# Patient Record
Sex: Female | Born: 1950 | Race: White | Hispanic: No | Marital: Married | Smoking: Current every day smoker
Health system: Southern US, Community
[De-identification: ages and names within clinical notes are randomized; demographics above are authoritative.]

## PROBLEM LIST (undated history)

## (undated) DIAGNOSIS — R51 Headache: Secondary | ICD-10-CM

## (undated) DIAGNOSIS — Z973 Presence of spectacles and contact lenses: Secondary | ICD-10-CM

## (undated) DIAGNOSIS — F419 Anxiety disorder, unspecified: Secondary | ICD-10-CM

## (undated) DIAGNOSIS — Z889 Allergy status to unspecified drugs, medicaments and biological substances status: Secondary | ICD-10-CM

## (undated) DIAGNOSIS — R05 Cough: Secondary | ICD-10-CM

## (undated) DIAGNOSIS — F32A Depression, unspecified: Secondary | ICD-10-CM

## (undated) DIAGNOSIS — G2581 Restless legs syndrome: Secondary | ICD-10-CM

## (undated) DIAGNOSIS — E039 Hypothyroidism, unspecified: Secondary | ICD-10-CM

## (undated) DIAGNOSIS — K219 Gastro-esophageal reflux disease without esophagitis: Secondary | ICD-10-CM

## (undated) DIAGNOSIS — I479 Paroxysmal tachycardia, unspecified: Secondary | ICD-10-CM

## (undated) DIAGNOSIS — D369 Benign neoplasm, unspecified site: Secondary | ICD-10-CM

## (undated) DIAGNOSIS — Z9889 Other specified postprocedural states: Secondary | ICD-10-CM

## (undated) DIAGNOSIS — F329 Major depressive disorder, single episode, unspecified: Secondary | ICD-10-CM

## (undated) DIAGNOSIS — R001 Bradycardia, unspecified: Secondary | ICD-10-CM

## (undated) DIAGNOSIS — M1990 Unspecified osteoarthritis, unspecified site: Secondary | ICD-10-CM

## (undated) DIAGNOSIS — Q791 Other congenital malformations of diaphragm: Secondary | ICD-10-CM

## (undated) DIAGNOSIS — R053 Chronic cough: Secondary | ICD-10-CM

## (undated) DIAGNOSIS — C801 Malignant (primary) neoplasm, unspecified: Secondary | ICD-10-CM

## (undated) DIAGNOSIS — G4733 Obstructive sleep apnea (adult) (pediatric): Secondary | ICD-10-CM

## (undated) DIAGNOSIS — R112 Nausea with vomiting, unspecified: Secondary | ICD-10-CM

## (undated) DIAGNOSIS — T884XXA Failed or difficult intubation, initial encounter: Secondary | ICD-10-CM

## (undated) DIAGNOSIS — E785 Hyperlipidemia, unspecified: Secondary | ICD-10-CM

## (undated) HISTORY — PX: COLONOSCOPY: SHX174

## (undated) HISTORY — DX: Malignant (primary) neoplasm, unspecified: C80.1

## (undated) HISTORY — DX: Restless legs syndrome: G25.81

## (undated) HISTORY — DX: Hyperlipidemia, unspecified: E78.5

## (undated) HISTORY — DX: Bradycardia, unspecified: R00.1

## (undated) HISTORY — PX: STRABISMUS SURGERY: SHX218

## (undated) HISTORY — DX: Benign neoplasm, unspecified site: D36.9

## (undated) HISTORY — DX: Paroxysmal tachycardia, unspecified: I47.9

## (undated) HISTORY — DX: Obstructive sleep apnea (adult) (pediatric): G47.33

## (undated) HISTORY — PX: WISDOM TOOTH EXTRACTION: SHX21

## (undated) HISTORY — PX: APPENDECTOMY: SHX54

---

## 1986-09-19 HISTORY — PX: ABDOMINAL HYSTERECTOMY: SHX81

## 1998-05-27 ENCOUNTER — Other Ambulatory Visit: Admission: RE | Admit: 1998-05-27 | Discharge: 1998-05-27 | Payer: Self-pay | Admitting: Gynecology

## 1999-10-11 ENCOUNTER — Other Ambulatory Visit: Admission: RE | Admit: 1999-10-11 | Discharge: 1999-10-11 | Payer: Self-pay | Admitting: Gynecology

## 1999-12-13 ENCOUNTER — Ambulatory Visit (HOSPITAL_COMMUNITY): Admission: RE | Admit: 1999-12-13 | Discharge: 1999-12-13 | Payer: Self-pay | Admitting: Gastroenterology

## 1999-12-13 ENCOUNTER — Encounter (INDEPENDENT_AMBULATORY_CARE_PROVIDER_SITE_OTHER): Payer: Self-pay

## 2002-12-24 ENCOUNTER — Other Ambulatory Visit: Admission: RE | Admit: 2002-12-24 | Discharge: 2002-12-24 | Payer: Self-pay | Admitting: Gynecology

## 2003-12-24 ENCOUNTER — Ambulatory Visit (HOSPITAL_COMMUNITY): Admission: RE | Admit: 2003-12-24 | Discharge: 2003-12-24 | Payer: Self-pay | Admitting: Obstetrics and Gynecology

## 2006-02-28 ENCOUNTER — Ambulatory Visit (HOSPITAL_COMMUNITY): Admission: RE | Admit: 2006-02-28 | Discharge: 2006-02-28 | Payer: Self-pay | Admitting: Obstetrics & Gynecology

## 2007-12-21 ENCOUNTER — Encounter: Admission: RE | Admit: 2007-12-21 | Discharge: 2007-12-21 | Payer: Self-pay | Admitting: *Deleted

## 2008-09-19 HISTORY — PX: HEMORRHOID SURGERY: SHX153

## 2008-10-14 ENCOUNTER — Encounter (INDEPENDENT_AMBULATORY_CARE_PROVIDER_SITE_OTHER): Payer: Self-pay | Admitting: General Surgery

## 2008-10-14 ENCOUNTER — Ambulatory Visit (HOSPITAL_COMMUNITY): Admission: RE | Admit: 2008-10-14 | Discharge: 2008-10-14 | Payer: Self-pay | Admitting: General Surgery

## 2008-10-26 ENCOUNTER — Emergency Department (HOSPITAL_COMMUNITY): Admission: EM | Admit: 2008-10-26 | Discharge: 2008-10-26 | Payer: Self-pay | Admitting: Emergency Medicine

## 2010-02-17 DIAGNOSIS — C801 Malignant (primary) neoplasm, unspecified: Secondary | ICD-10-CM

## 2010-02-17 HISTORY — DX: Malignant (primary) neoplasm, unspecified: C80.1

## 2010-08-24 ENCOUNTER — Ambulatory Visit: Payer: Self-pay | Admitting: Allergy

## 2010-09-19 HISTORY — PX: ELBOW BURSA SURGERY: SHX615

## 2011-01-03 LAB — CBC
HCT: 43.8 % (ref 36.0–46.0)
Hemoglobin: 14.6 g/dL (ref 12.0–15.0)
MCHC: 33.4 g/dL (ref 30.0–36.0)
Platelets: 330 10*3/uL (ref 150–400)
RDW: 12.7 % (ref 11.5–15.5)
WBC: 12.3 10*3/uL — ABNORMAL HIGH (ref 4.0–10.5)

## 2011-01-03 LAB — COMPREHENSIVE METABOLIC PANEL
BUN: 5 mg/dL — ABNORMAL LOW (ref 6–23)
GFR calc Af Amer: >60 mL/min (ref >60)
GFR calc non Af Amer: >60 mL/min (ref >60)
Potassium: 4.1 mEq/L (ref 3.5–5.1)
Sodium: 138 mEq/L (ref 135–145)

## 2011-01-04 ENCOUNTER — Ambulatory Visit
Admission: RE | Admit: 2011-01-04 | Discharge: 2011-01-04 | Disposition: A | Discharge status: Home / Self Care | Payer: 59 | Source: Ambulatory Visit | Attending: Family Medicine | Admitting: Family Medicine

## 2011-01-04 ENCOUNTER — Other Ambulatory Visit: Payer: Self-pay | Admitting: Family Medicine

## 2011-01-04 DIAGNOSIS — R05 Cough: Secondary | ICD-10-CM

## 2011-01-06 ENCOUNTER — Ambulatory Visit (HOSPITAL_COMMUNITY)
Admission: RE | Admit: 2011-01-06 | Discharge: 2011-01-06 | Disposition: A | Discharge status: Home / Self Care | Payer: 59 | Source: Ambulatory Visit | Attending: Family Medicine | Admitting: Family Medicine

## 2011-01-06 DIAGNOSIS — J4489 Other specified chronic obstructive pulmonary disease: Secondary | ICD-10-CM | POA: Insufficient documentation

## 2011-01-06 DIAGNOSIS — J449 Chronic obstructive pulmonary disease, unspecified: Secondary | ICD-10-CM | POA: Insufficient documentation

## 2011-02-01 NOTE — Op Note (Signed)
[name], [name]               ACCOUNT NO.:  [id number]   MEDICAL RECORD NO.:  [id number]          PATIENT TYPE:  AMB   LOCATION:  SDS                          FACILITY:  [facility]   PHYSICIAN:  Ollen Gross. Vernell Morgans, M.D. DATE OF BIRTH:  24-May-1951   DATE OF PROCEDURE:  10/14/2008  DATE OF DISCHARGE:  10/14/2008                               OPERATIVE REPORT   PREOPERATIVE DIAGNOSIS:  Internal hemorrhoids.   POSTOPERATIVE DIAGNOSIS:  Internal hemorrhoids.   PROCEDURE:  PPH hemorrhoidectomy.   SURGEON:  Ollen Gross. Vernell Morgans, MD   ANESTHESIA:  General endotracheal.   PROCEDURE:  After informed consent was obtained, the patient was brought  to the operating room and left in the supine position on a stretcher.  After adequate induction of general anesthesia, the patient was then  moved into a prone position on the operating room table and all pressure  points were padded.  Her buttocks were then retracted laterally with  tape and she was placed in a jackknife-type position.  Perirectal area  was then prepped with Betadine and draped in the usual sterile manner.  The perirectal area was infiltrated with 0.25 Marcaine and 1 mL Wydase  and the tissue was massaged gently for several minutes.  A small bullet  retractor was then placed in the rectum.  The rectum was examined  circumferentially.  The patient had pretty significant internal  hemorrhoids.  She had some mild external hemorrhoidal disease as well.  Next, a large deep Fansler retractor was placed in the rectum.  A 2-0  Prolene purse-string stitch was placed circumferentially about 4-5 cm  deep to the dentate line in the mucosa and submucosa only.  Once the  purse-string stitch was placed circumferentially around the inside of  the rectum, the Fansler retractor was removed.  The tails of the Prolene  were brought through a clear plastic retractor and white dilator from  the kit.  The dilator and retractor were then placed in the rectum.   The  white dilator was removed.  The tails of the Prolene stitch were held  while the [facility] stapling device was placed in the rectum.  The anvil of  the stapling device was felt to fall beneath the Prolene purse-string  stitch.  The Prolene purse-string stitch was then cinched down and tied.  The tails of the Prolene were then brought through the lateral holes in  the stapling device and an air knot was tied along with it and hemostat  was placed.  This was for retraction.  With gentle retraction on the  pursestring stitch, the stapling device was then closed all the way.  A  minute was allowed to pass.  The vagina was felt digitally and while the  stapling device was moved and it seemed as though the stapling device  moved independent of the wall of vagina.  The stapling device was then  fired.  Another minute was allowed to pass and stapling device was then  opened half turn and removed along with the clear plastic retractor.  The specimen was examined and there were  no muscle fibers within the  specimen.  There were 2 good columns of hemorrhoid tissue removed and  specimen was fairly uniform.  It was then sent to pathology for further  evaluation.  The staple line was then examined and several small  bleeding points on the staple line were controlled with figure-of-eight  4-0 Vicryl stitches.  Once this was accomplished, staple line appeared  to be completely hemostatic and the staple line appeared to be a couple  centimeters deep to the dentate line in good position.  At this point,  the rectum was filled with some  dibucaine ointment.  A small piece of Gelfoam and sterile dressings were  applied.  The patient tolerated the procedure well.  At the end of the  case, all needle, sponge, and instrument counts were correct.  The  patient was awoke and taken to the recovery room in stable condition.      Ollen Gross. Vernell Morgans, M.D.  Electronically Signed     PST/MEDQ  D:  10/14/2008  T:   10/15/2008  Job:  [id number]

## 2011-03-07 ENCOUNTER — Other Ambulatory Visit: Payer: Self-pay | Admitting: Gastroenterology

## 2011-03-14 ENCOUNTER — Other Ambulatory Visit: Payer: Self-pay | Admitting: Orthopedic Surgery

## 2013-02-14 ENCOUNTER — Encounter (HOSPITAL_BASED_OUTPATIENT_CLINIC_OR_DEPARTMENT_OTHER): Payer: Self-pay | Admitting: *Deleted

## 2013-02-14 NOTE — Progress Notes (Signed)
Pt was to have this surgery at the [facility] 02/11/13-then got block done-had too much difficulty intubating-could not do surgery-dr hewitt talked with dr crews-dr crews wants to see pt 02/15/13 after ov dr hewitt 9am. Pt hx tachycardia-needs ekg and bmet-she is a smoker, but down to a few-throat very sore-multiple allergies and uses nasal sprays, singular,zyrtec d-some chest congestion-told her she could try mussinex- She was put in cast and using scoter and walker.

## 2013-02-15 ENCOUNTER — Other Ambulatory Visit (HOSPITAL_BASED_OUTPATIENT_CLINIC_OR_DEPARTMENT_OTHER): Payer: 59

## 2013-02-15 ENCOUNTER — Encounter (HOSPITAL_BASED_OUTPATIENT_CLINIC_OR_DEPARTMENT_OTHER)
Admission: RE | Admit: 2013-02-15 | Discharge: 2013-02-15 | Disposition: A | Discharge status: Home / Self Care | Payer: 59 | Source: Ambulatory Visit | Attending: Orthopedic Surgery | Admitting: Orthopedic Surgery

## 2013-02-15 NOTE — Pre-Procedure Instructions (Signed)
Pt. evaluated by Dr. Ivin Booty; is OK to come for surgery. Anesthesia record req. from [facility].

## 2013-02-20 ENCOUNTER — Other Ambulatory Visit: Payer: Self-pay | Admitting: Orthopedic Surgery

## 2013-02-21 ENCOUNTER — Encounter (HOSPITAL_BASED_OUTPATIENT_CLINIC_OR_DEPARTMENT_OTHER): Payer: Self-pay | Admitting: Certified Registered Nurse Anesthetist

## 2013-02-21 ENCOUNTER — Ambulatory Visit (HOSPITAL_BASED_OUTPATIENT_CLINIC_OR_DEPARTMENT_OTHER): Payer: 59 | Admitting: Certified Registered Nurse Anesthetist

## 2013-02-21 ENCOUNTER — Ambulatory Visit (HOSPITAL_BASED_OUTPATIENT_CLINIC_OR_DEPARTMENT_OTHER)
Admission: RE | Admit: 2013-02-21 | Discharge: 2013-02-22 | Disposition: A | Discharge status: Home / Self Care | Payer: 59 | Source: Ambulatory Visit | Attending: Orthopedic Surgery | Admitting: Orthopedic Surgery

## 2013-02-21 ENCOUNTER — Encounter (HOSPITAL_BASED_OUTPATIENT_CLINIC_OR_DEPARTMENT_OTHER)
Admission: RE | Disposition: A | Discharge status: Home / Self Care | Payer: Self-pay | Source: Ambulatory Visit | Attending: Orthopedic Surgery

## 2013-02-21 ENCOUNTER — Ambulatory Visit (HOSPITAL_COMMUNITY): Payer: 59

## 2013-02-21 DIAGNOSIS — F411 Generalized anxiety disorder: Secondary | ICD-10-CM | POA: Insufficient documentation

## 2013-02-21 DIAGNOSIS — Y838 Other surgical procedures as the cause of abnormal reaction of the patient, or of later complication, without mention of misadventure at the time of the procedure: Secondary | ICD-10-CM | POA: Insufficient documentation

## 2013-02-21 DIAGNOSIS — I519 Heart disease, unspecified: Secondary | ICD-10-CM | POA: Insufficient documentation

## 2013-02-21 DIAGNOSIS — Z538 Procedure and treatment not carried out for other reasons: Secondary | ICD-10-CM | POA: Insufficient documentation

## 2013-02-21 DIAGNOSIS — F329 Major depressive disorder, single episode, unspecified: Secondary | ICD-10-CM | POA: Insufficient documentation

## 2013-02-21 DIAGNOSIS — M928 Other specified juvenile osteochondrosis: Secondary | ICD-10-CM | POA: Insufficient documentation

## 2013-02-21 DIAGNOSIS — Z885 Allergy status to narcotic agent status: Secondary | ICD-10-CM | POA: Insufficient documentation

## 2013-02-21 DIAGNOSIS — R110 Nausea: Secondary | ICD-10-CM | POA: Insufficient documentation

## 2013-02-21 DIAGNOSIS — I469 Cardiac arrest, cause unspecified: Secondary | ICD-10-CM

## 2013-02-21 DIAGNOSIS — F3289 Other specified depressive episodes: Secondary | ICD-10-CM | POA: Insufficient documentation

## 2013-02-21 DIAGNOSIS — Z7982 Long term (current) use of aspirin: Secondary | ICD-10-CM | POA: Insufficient documentation

## 2013-02-21 DIAGNOSIS — S86012S Strain of left Achilles tendon, sequela: Secondary | ICD-10-CM

## 2013-02-21 DIAGNOSIS — E039 Hypothyroidism, unspecified: Secondary | ICD-10-CM | POA: Insufficient documentation

## 2013-02-21 DIAGNOSIS — I471 Supraventricular tachycardia: Secondary | ICD-10-CM

## 2013-02-21 DIAGNOSIS — S93499A Sprain of other ligament of unspecified ankle, initial encounter: Secondary | ICD-10-CM | POA: Insufficient documentation

## 2013-02-21 DIAGNOSIS — I498 Other specified cardiac arrhythmias: Secondary | ICD-10-CM | POA: Insufficient documentation

## 2013-02-21 DIAGNOSIS — Y9210 Unspecified residential institution as the place of occurrence of the external cause: Secondary | ICD-10-CM | POA: Insufficient documentation

## 2013-02-21 DIAGNOSIS — S96819A Strain of other specified muscles and tendons at ankle and foot level, unspecified foot, initial encounter: Secondary | ICD-10-CM | POA: Insufficient documentation

## 2013-02-21 DIAGNOSIS — F17200 Nicotine dependence, unspecified, uncomplicated: Secondary | ICD-10-CM | POA: Insufficient documentation

## 2013-02-21 DIAGNOSIS — M129 Arthropathy, unspecified: Secondary | ICD-10-CM | POA: Insufficient documentation

## 2013-02-21 DIAGNOSIS — Z79899 Other long term (current) drug therapy: Secondary | ICD-10-CM | POA: Insufficient documentation

## 2013-02-21 HISTORY — DX: Headache: R51

## 2013-02-21 HISTORY — DX: Chronic cough: R05.3

## 2013-02-21 HISTORY — DX: Depression, unspecified: F32.A

## 2013-02-21 HISTORY — DX: Anxiety disorder, unspecified: F41.9

## 2013-02-21 HISTORY — DX: Presence of spectacles and contact lenses: Z97.3

## 2013-02-21 HISTORY — PX: GASTROCNEMIUS RECESSION: SHX863

## 2013-02-21 HISTORY — DX: Hypothyroidism, unspecified: E03.9

## 2013-02-21 HISTORY — DX: Other specified postprocedural states: R11.2

## 2013-02-21 HISTORY — DX: Unspecified osteoarthritis, unspecified site: M19.90

## 2013-02-21 HISTORY — DX: Other specified postprocedural states: Z98.890

## 2013-02-21 HISTORY — DX: Major depressive disorder, single episode, unspecified: F32.9

## 2013-02-21 HISTORY — DX: Cough: R05

## 2013-02-21 HISTORY — DX: Allergy status to unspecified drugs, medicaments and biological substances: Z88.9

## 2013-02-21 LAB — POCT I-STAT, CHEM 8
BUN: 7 mg/dL (ref 6–23)
Creatinine, Ser: 0.6 mg/dL (ref 0.50–1.10)
Hemoglobin: 13.9 g/dL (ref 12.0–15.0)
Potassium: 3.3 mEq/L — ABNORMAL LOW (ref 3.5–5.1)
Sodium: 139 mEq/L (ref 135–145)

## 2013-02-21 LAB — POCT I-STAT 3, ART BLOOD GAS (G3+)
O2 Saturation: 93 %
Patient temperature: 98.6
pCO2 arterial: 58.4 mmHg (ref 35.0–45.0)
pH, Arterial: 7.307 — ABNORMAL LOW (ref 7.350–7.450)

## 2013-02-21 LAB — CK TOTAL AND CKMB (NOT AT ARMC)
CK, MB: 1.3 ng/mL (ref 0.3–4.0)
Relative Index: INVALID (ref 0.0–2.5)

## 2013-02-21 SURGERY — RECESSION, MUSCLE, GASTROCNEMIUS
Anesthesia: Regional | Site: Foot | Laterality: Left | Wound class: Contaminated

## 2013-02-21 MED ORDER — CHLORHEXIDINE GLUCONATE 4 % EX LIQD: 60.0000 mL | Freq: Once | CUTANEOUS | Status: DC

## 2013-02-21 MED ORDER — 0.9 % SODIUM CHLORIDE (POUR BTL) OPTIME
TOPICAL | Status: DC | PRN
  Administered 2013-02-21: 100 mL

## 2013-02-21 MED ORDER — SODIUM CHLORIDE 0.9 % IV SOLN: INTRAVENOUS | Status: DC

## 2013-02-21 MED ORDER — MEPERIDINE HCL 25 MG/ML IJ SOLN: 6.2500 mg | INTRAMUSCULAR | Status: DC | PRN

## 2013-02-21 MED ORDER — AZELASTINE HCL 0.1 % NA SOLN: 1.0000 | Freq: Every morning | NASAL | Status: DC

## 2013-02-21 MED ORDER — ALBUTEROL SULFATE HFA 108 (90 BASE) MCG/ACT IN AERS: 2.0000 | INHALATION_SPRAY | Freq: Four times a day (QID) | RESPIRATORY_TRACT | Status: DC | PRN

## 2013-02-21 MED ORDER — PROPOFOL INFUSION 10 MG/ML OPTIME
INTRAVENOUS | Status: DC | PRN
  Administered 2013-02-21: 75 ug/kg/min via INTRAVENOUS

## 2013-02-21 MED ORDER — ONDANSETRON HCL 4 MG/2ML IJ SOLN: 4.0000 mg | Freq: Once | INTRAMUSCULAR | Status: AC | PRN

## 2013-02-21 MED ORDER — SODIUM CHLORIDE 0.9 % IV SOLN
INTRAVENOUS | Status: DC
  Administered 2013-02-21: 16:00:00 via INTRAVENOUS

## 2013-02-21 MED ORDER — ATENOLOL 25 MG PO TABS: 25.0000 mg | ORAL_TABLET | Freq: Every day | ORAL | Status: DC

## 2013-02-21 MED ORDER — BUPIVACAINE-EPINEPHRINE PF 0.5-1:200000 % IJ SOLN
INTRAMUSCULAR | Status: DC | PRN
  Administered 2013-02-21: 30 mL

## 2013-02-21 MED ORDER — CALCIUM CARBONATE-VITAMIN D 500-200 MG-UNIT PO TABS: 1.0000 | ORAL_TABLET | Freq: Every day | ORAL | Status: DC

## 2013-02-21 MED ORDER — MONTELUKAST SODIUM 10 MG PO TABS: 10.0000 mg | ORAL_TABLET | Freq: Every day | ORAL | Status: DC

## 2013-02-21 MED ORDER — ONDANSETRON HCL 4 MG/2ML IJ SOLN
4.0000 mg | Freq: Four times a day (QID) | INTRAMUSCULAR | Status: DC | PRN
  Administered 2013-02-21 – 2013-02-22 (×2): 4 mg via INTRAVENOUS

## 2013-02-21 MED ORDER — HYDROCODONE-ACETAMINOPHEN 5-325 MG PO TABS: 1.0000 | ORAL_TABLET | Freq: Four times a day (QID) | ORAL | Status: DC | PRN

## 2013-02-21 MED ORDER — LEVOTHYROXINE SODIUM 75 MCG PO TABS: 75.0000 ug | ORAL_TABLET | Freq: Every day | ORAL | Status: DC

## 2013-02-21 MED ORDER — MIDAZOLAM HCL 2 MG/2ML IJ SOLN
1.0000 mg | INTRAMUSCULAR | Status: DC | PRN
  Administered 2013-02-21: 2 mg via INTRAVENOUS

## 2013-02-21 MED ORDER — MORPHINE SULFATE 2 MG/ML IJ SOLN: 1.0000 mg | INTRAMUSCULAR | Status: DC | PRN

## 2013-02-21 MED ORDER — FLUTICASONE PROPIONATE 50 MCG/ACT NA SUSP: 2.0000 | Freq: Every evening | NASAL | Status: DC

## 2013-02-21 MED ORDER — LIDOCAINE-EPINEPHRINE (PF) 1.5 %-1:200000 IJ SOLN
INTRAMUSCULAR | Status: DC | PRN
  Administered 2013-02-21: 30 mL

## 2013-02-21 MED ORDER — ATROPINE SULFATE 0.4 MG/ML IJ SOLN
INTRAMUSCULAR | Status: DC | PRN
  Administered 2013-02-21: 0.2 mg via INTRAVENOUS

## 2013-02-21 MED ORDER — LACTATED RINGERS IV SOLN
INTRAVENOUS | Status: DC
  Administered 2013-02-21 (×2): via INTRAVENOUS

## 2013-02-21 MED ORDER — OXYCODONE HCL 5 MG/5ML PO SOLN: 5.0000 mg | Freq: Once | ORAL | Status: AC | PRN

## 2013-02-21 MED ORDER — HYDROCODONE-ACETAMINOPHEN 5-325 MG PO TABS
1.0000 | ORAL_TABLET | ORAL | Status: DC | PRN
  Administered 2013-02-21: 1 via ORAL

## 2013-02-21 MED ORDER — SUMATRIPTAN SUCCINATE 100 MG PO TABS: 100.0000 mg | ORAL_TABLET | ORAL | Status: DC | PRN

## 2013-02-21 MED ORDER — ONDANSETRON HCL 4 MG PO TABS: 4.0000 mg | ORAL_TABLET | Freq: Four times a day (QID) | ORAL | Status: DC | PRN

## 2013-02-21 MED ORDER — LORAZEPAM 1 MG PO TABS
2.0000 mg | ORAL_TABLET | Freq: Two times a day (BID) | ORAL | Status: DC | PRN
  Administered 2013-02-21 – 2013-02-22 (×2): 2 mg via ORAL

## 2013-02-21 MED ORDER — ROPINIROLE HCL 0.5 MG PO TABS: 0.5000 mg | ORAL_TABLET | Freq: Every day | ORAL | Status: DC

## 2013-02-21 MED ORDER — MIDAZOLAM HCL 5 MG/5ML IJ SOLN
INTRAMUSCULAR | Status: DC | PRN
  Administered 2013-02-21: 1 mg via INTRAVENOUS

## 2013-02-21 MED ORDER — FENTANYL CITRATE 0.05 MG/ML IJ SOLN
INTRAMUSCULAR | Status: DC | PRN
  Administered 2013-02-21: 50 ug via INTRAVENOUS

## 2013-02-21 MED ORDER — OXYCODONE HCL 5 MG PO TABS: 5.0000 mg | ORAL_TABLET | Freq: Once | ORAL | Status: AC | PRN

## 2013-02-21 MED ORDER — SERTRALINE HCL 100 MG PO TABS: 100.0000 mg | ORAL_TABLET | Freq: Every day | ORAL | Status: DC

## 2013-02-21 MED ORDER — ASPIRIN 325 MG PO TABS: 325.0000 mg | ORAL_TABLET | Freq: Every day | ORAL | Status: DC

## 2013-02-21 MED ORDER — CEFAZOLIN SODIUM-DEXTROSE 2-3 GM-% IV SOLR
2.0000 g | INTRAVENOUS | Status: AC
  Administered 2013-02-21: 2 g via INTRAVENOUS

## 2013-02-21 MED ORDER — FENTANYL CITRATE 0.05 MG/ML IJ SOLN
50.0000 ug | INTRAMUSCULAR | Status: DC | PRN
  Administered 2013-02-21: 100 ug via INTRAVENOUS

## 2013-02-21 MED ORDER — HYDROMORPHONE HCL PF 1 MG/ML IJ SOLN: 0.2500 mg | INTRAMUSCULAR | Status: DC | PRN

## 2013-02-21 MED ORDER — ONDANSETRON HCL 4 MG/2ML IJ SOLN
INTRAMUSCULAR | Status: DC | PRN
  Administered 2013-02-21: 4 mg via INTRAVENOUS

## 2013-02-21 MED ORDER — LIDOCAINE HCL (CARDIAC) 20 MG/ML IV SOLN
INTRAVENOUS | Status: DC | PRN
  Administered 2013-02-21: 30 mg via INTRAVENOUS

## 2013-02-21 MED ORDER — LORATADINE 10 MG PO TABS: 10.0000 mg | ORAL_TABLET | Freq: Every day | ORAL | Status: DC

## 2013-02-21 SURGICAL SUPPLY — 74 items
BANDAGE ESMARK 6X9 LF (GAUZE/BANDAGES/DRESSINGS) ×1 IMPLANT
BLADE AVERAGE 25X9 (BLADE) ×2 IMPLANT
BLADE SURG 15 STRL LF DISP TIS (BLADE) ×2 IMPLANT
BLADE SURG 15 STRL SS (BLADE) ×4
BNDG CMPR 9X6 STRL LF SNTH (GAUZE/BANDAGES/DRESSINGS) ×1
BNDG COHESIVE 4X5 TAN STRL (GAUZE/BANDAGES/DRESSINGS) ×3 IMPLANT
BNDG COHESIVE 6X5 TAN STRL LF (GAUZE/BANDAGES/DRESSINGS) ×3 IMPLANT
BNDG ESMARK 6X9 LF (GAUZE/BANDAGES/DRESSINGS) ×2
CANISTER SUCTION 1200CC (MISCELLANEOUS) IMPLANT
CHLORAPREP W/TINT 26ML (MISCELLANEOUS) ×2 IMPLANT
CLOTH BEACON ORANGE TIMEOUT ST (SAFETY) ×2 IMPLANT
COVER TABLE BACK 60X90 (DRAPES) ×2 IMPLANT
CUFF TOURNIQUET SINGLE 34IN LL (TOURNIQUET CUFF) ×2 IMPLANT
DRAPE EXTREMITY T 121X128X90 (DRAPE) ×2 IMPLANT
DRAPE OEC MINIVIEW 54X84 (DRAPES) ×1 IMPLANT
DRAPE U-SHAPE 47X51 STRL (DRAPES) ×3 IMPLANT
DRSG EMULSION OIL 3X3 NADH (GAUZE/BANDAGES/DRESSINGS) ×1 IMPLANT
DRSG PAD ABDOMINAL 8X10 ST (GAUZE/BANDAGES/DRESSINGS) ×5 IMPLANT
DURA STEPPER LG (CAST SUPPLIES) IMPLANT
DURA STEPPER MED (CAST SUPPLIES) IMPLANT
ELECT REM PT RETURN 9FT ADLT (ELECTROSURGICAL) ×2
ELECTRODE REM PT RTRN 9FT ADLT (ELECTROSURGICAL) ×1 IMPLANT
GLOVE BIO SURGEON STRL SZ8 (GLOVE) ×2 IMPLANT
GLOVE BIOGEL PI IND STRL 8 (GLOVE) ×1 IMPLANT
GLOVE BIOGEL PI INDICATOR 8 (GLOVE) ×1
GLOVE EXAM NITRILE MD LF STRL (GLOVE) IMPLANT
GOWN PREVENTION PLUS XLARGE (GOWN DISPOSABLE) ×2 IMPLANT
GOWN PREVENTION PLUS XXLARGE (GOWN DISPOSABLE) ×2 IMPLANT
KWIRE 4.0 X .062IN (WIRE) IMPLANT
NDL HYPO 25X1 1.5 SAFETY (NEEDLE) IMPLANT
NDL SAFETY ECLIPSE 18X1.5 (NEEDLE) IMPLANT
NEEDLE HYPO 18GX1.5 SHARP (NEEDLE)
NEEDLE HYPO 22GX1.5 SAFETY (NEEDLE) IMPLANT
NEEDLE HYPO 25X1 1.5 SAFETY (NEEDLE) IMPLANT
NS IRRIG 1000ML POUR BTL (IV SOLUTION) ×2 IMPLANT
PACK BASIN DAY SURGERY FS (CUSTOM PROCEDURE TRAY) ×3 IMPLANT
PAD CAST 4YDX4 CTTN HI CHSV (CAST SUPPLIES) ×2 IMPLANT
PADDING CAST ABS 4INX4YD NS (CAST SUPPLIES)
PADDING CAST ABS COTTON 4X4 ST (CAST SUPPLIES) IMPLANT
PADDING CAST COTTON 4X4 STRL (CAST SUPPLIES) ×8
PADDING CAST COTTON 6X4 STRL (CAST SUPPLIES) ×3 IMPLANT
PENCIL BUTTON HOLSTER BLD 10FT (ELECTRODE) ×2 IMPLANT
SANITIZER HAND PURELL 535ML FO (MISCELLANEOUS) ×2 IMPLANT
SHEET MEDIUM DRAPE 40X70 STRL (DRAPES) ×3 IMPLANT
SLEEVE SCD COMPRESS KNEE MED (MISCELLANEOUS) ×2 IMPLANT
SPLINT FAST PLASTER 5X30 (CAST SUPPLIES)
SPLINT PLASTER CAST FAST 5X30 (CAST SUPPLIES) ×20 IMPLANT
SPONGE GAUZE 4X4 12PLY (GAUZE/BANDAGES/DRESSINGS) ×2 IMPLANT
SPONGE LAP 18X18 X RAY DECT (DISPOSABLE) ×2 IMPLANT
STAPLER VISISTAT 35W (STAPLE) IMPLANT
STOCKINETTE 6  STRL (DRAPES) ×1
STOCKINETTE 6 STRL (DRAPES) ×1 IMPLANT
STRIP CLOSURE SKIN 1/2X4 (GAUZE/BANDAGES/DRESSINGS) IMPLANT
SUCTION FRAZIER TIP 10 FR DISP (SUCTIONS) IMPLANT
SUT ETHIBOND 2 OS 4 DA (SUTURE) IMPLANT
SUT ETHIBOND 3-0 V-5 (SUTURE) IMPLANT
SUT ETHILON 3 0 PS 1 (SUTURE) ×2 IMPLANT
SUT FIBERWIRE #2 38 T-5 BLUE (SUTURE)
SUT MNCRL AB 3-0 PS2 18 (SUTURE) ×2 IMPLANT
SUT MNCRL AB 4-0 PS2 18 (SUTURE) IMPLANT
SUT VIC AB 0 CT1 27 (SUTURE)
SUT VIC AB 0 CT1 27XBRD ANBCTR (SUTURE) IMPLANT
SUT VIC AB 0 SH 27 (SUTURE) IMPLANT
SUT VIC AB 2-0 SH 18 (SUTURE) IMPLANT
SUT VIC AB 2-0 SH 27 (SUTURE)
SUT VIC AB 2-0 SH 27XBRD (SUTURE) IMPLANT
SUT VICRYL 4-0 PS2 18IN ABS (SUTURE) IMPLANT
SUTURE FIBERWR #2 38 T-5 BLUE (SUTURE) IMPLANT
SYR BULB 3OZ (MISCELLANEOUS) ×2 IMPLANT
SYR CONTROL 10ML LL (SYRINGE) IMPLANT
TOWEL OR 17X24 6PK STRL BLUE (TOWEL DISPOSABLE) ×2 IMPLANT
TUBE CONNECTING 20X1/4 (TUBING) IMPLANT
UNDERPAD 30X30 INCONTINENT (UNDERPADS AND DIAPERS) ×2 IMPLANT
YANKAUER SUCT BULB TIP NO VENT (SUCTIONS) IMPLANT

## 2013-02-21 NOTE — H&P (Signed)
[name] is an 62 y.o. female.   Chief Complaint: left achilles tendon rupture HPI: 62 y/o female now about 2 weeks out from avulsion of left achilles tendon from the calc.  Repair was attempted last week, but due to anesthesia problems it was aborted.  She presents now for gastroc recession, achilles reconstruction Haglund excision and possible [facility] transfer.  Past Medical History  Diagnosis Date  . Dysrhythmia     tachycardia  . Multiple allergies   . Headache(784.0)   . Arthritis   . Anxiety   . Depression   . Hypothyroidism   . Chronic cough   . Wears glasses   . PONV (postoperative nausea and vomiting)     has to use scop patch    Past Surgical History  Procedure Laterality Date  . Abdominal hysterectomy  1988  . Appendectomy      with hyst  . Hemorrhoid surgery  2010    [facility]  . Strabismus surgery      age 23 x3  . Wisdom tooth extraction    . Elbow bursa surgery  2012    lt  . Colonoscopy      x3    History reviewed. No pertinent family history. Social History:  reports that she has been smoking.  She does not have any smokeless tobacco history on file. She reports that she uses illicit drugs. Her alcohol history is not on file.  Allergies:  Allergies  Allergen Reactions  . Dilaudid (Hydromorphone Hcl)     tachycardia    Medications Prior to Admission  Medication Sig Dispense Refill  . albuterol (PROVENTIL HFA;VENTOLIN HFA) 108 (90 BASE) MCG/ACT inhaler Inhale 2 puffs into the lungs every 6 (six) hours as needed for wheezing.      Marland Kitchen aspirin 325 MG tablet Take 325 mg by mouth daily.      Marland Kitchen atenolol (TENORMIN) 25 MG tablet Take 25 mg by mouth daily.      Marland Kitchen azelastine (ASTELIN) 137 MCG/SPRAY nasal spray Place 1 spray into the nose every morning. Use in each nostril as directed      . calcium-vitamin D (OSCAL WITH D) 500-200 MG-UNIT per tablet Take 1 tablet by mouth daily.      . cetirizine-pseudoephedrine (ZYRTEC-D) 5-120 MG per tablet Take 1 tablet by mouth  2 (two) times daily.      . fish oil-omega-3 fatty acids 1000 MG capsule Take 2 g by mouth daily.      . fluticasone (FLONASE) 50 MCG/ACT nasal spray Place 2 sprays into the nose every evening.      Marland Kitchen levothyroxine (SYNTHROID, LEVOTHROID) 75 MCG tablet Take 75 mcg by mouth daily before breakfast.      . LORazepam (ATIVAN) 2 MG tablet Take 2 mg by mouth 2 (two) times daily as needed for anxiety.      . montelukast (SINGULAIR) 10 MG tablet Take 10 mg by mouth at bedtime.      . Multiple Vitamins-Minerals (MULTIVITAMIN WITH MINERALS) tablet Take 1 tablet by mouth daily.      Marland Kitchen rOPINIRole (REQUIP) 0.5 MG tablet Take 0.5 mg by mouth at bedtime.      . sertraline (ZOLOFT) 100 MG tablet Take 100 mg by mouth daily.      Marland Kitchen zolmitriptan (ZOMIG) 5 MG tablet Take 5 mg by mouth as needed for migraine.        Results for orders placed during the hospital encounter of 02/21/13 (from the past 48 hour(s))  POCT I-STAT, CHEM 8     Status: Abnormal   Collection Time    02-22-13  7:06 AM      Result Value Range   Sodium 139  135 - 145 mEq/L   Potassium 3.3 (*) 3.5 - 5.1 mEq/L   Chloride 101  96 - 112 mEq/L   BUN 7  6 - 23 mg/dL   Creatinine, Ser 4.09  0.50 - 1.10 mg/dL   Glucose, Bld 811 (*) 70 - 99 mg/dL   Calcium, Ion 9.14  7.82 - 1.30 mmol/L   TCO2 27  0 - 100 mmol/L   Hemoglobin 13.9  12.0 - 15.0 g/dL   HCT 95.6  21.3 - 08.6 %   No results found.  ROS  No recent f/c/n/v/wt loss  Blood pressure 139/74, pulse 61, temperature 98.5 F (36.9 C), temperature source Oral, resp. rate 18, height 5' (1.524 m), weight 71.85 kg (158 lb 6.4 oz), SpO2 95.00%. Physical Exam  wn wd woman in nad.  A and O.  Mood and affect normal.  EOMI.  Resp unlabored.  L leg with healthy skin and no lymphadenopathy.  4/5 strength in PF.  2+ dp and pt pulses.  Feels LT normally in the foot.  Assessment/Plan Left achilles avulsion, Haglund deformity and tight heelcord - to OR for achilles reconstruction, gastroc recession and  Haglund excision.  The risks and benefits of the alternative treatment options have been discussed in detail.  The patient wishes to proceed with surgery and specifically understands risks of bleeding, infection, nerve damage, blood clots, need for additional surgery, amputation and death.   Toni Arthurs 02/22/13, 7:21 AM

## 2013-02-21 NOTE — Brief Op Note (Signed)
02/21/2013  9:24 AM  PATIENT:  [name]  62 y.o. female  PRE-OPERATIVE DIAGNOSIS:  1.  Left achilles tendon avulsion from calcaneus      2.  Left tight heelcord      3.  Left haglund deformity  POST-OPERATIVE DIAGNOSIS:  Same  Procedure(s): 1.  Left gastrocnemius recession 2.  Application of short leg cast in equinus 3.  Aborted achilles tendon repair and Haglund excision  SURGEON:  Toni Arthurs, MD  ASSISTANT: n/a  ANESTHESIA:   regional  EBL:  minimal   TOURNIQUET:   Total Tourniquet Time Documented: Thigh (Left) - 14 minutes Total: Thigh (Left) - 14 minutes   COMPLICATIONS:  Intraoperative asystole requiring emergency intubation, chest compressions and atropine.  DISPOSITION:  Extubated, awake and stable to recovery.  DICTATION ID:  [id number]

## 2013-02-21 NOTE — Anesthesia Postprocedure Evaluation (Signed)
Anesthesia Post Note  Patient: [name]  Procedure(s) Performed: Procedure(s) (LRB): GASTROCNEMIUS RECESSION (Left)  Anesthesia type: general  Patient location: PACU  Post pain: Block providing excellent pain relief  Post assessment: Patient's Cardiovascular Status Stable  Last Vitals:  Filed Vitals:   02/21/13 1743  BP: 120/60  Pulse: 60  Temp:   Resp: 18    Post vital signs: Reviewed and stable  Level of consciousness: awake  Complications: Intraoperative cardiac arrest with resuscitation and full recovery. See intraoperative documentation and post op notes.

## 2013-02-21 NOTE — Anesthesia Procedure Notes (Addendum)
Anesthesia Regional Block:  Popliteal block  Pre-Anesthetic Checklist: ,, timeout performed, Correct Patient, Correct Site, Correct Laterality, Correct Procedure, Correct Position, site marked, Risks and benefits discussed,  Surgical consent,  Pre-op evaluation,  At surgeon's request and post-op pain management  Laterality: Left  Prep: chloraprep       Needles:  Injection technique: Single-shot  Needle Type: Echogenic Stimulator Needle     Needle Length: 10cm 10 cm     Additional Needles:  Procedures: ultrasound guided (picture in chart) and nerve stimulator Popliteal block  Nerve Stimulator or Paresthesia:  Response: 0.4 mA,   Additional Responses:   Narrative:  Start time: 02/21/2013 7:20 AM End time: 02/21/2013 7:30 AM Injection made incrementally with aspirations every 5 mL.  Performed by: Personally  Anesthesiologist: Arta Bruce MD  Additional Notes: Monitors applied. Patient sedated. Sterile prep and drape,hand hygiene and sterile gloves were used. Relevant anatomy identified.Needle position confirmed.Local anesthetic injected incrementally after negative aspiration. Local anesthetic spread visualized around nerve(s). Vascular puncture avoided. No complications. Image printed for medical record.The patient tolerated the procedure well.  Additional Saphenous nerve block performed. 15cc Local Anesthetic mixture placed under ultrasonic guidance along the medio-inferior border of the Sartorious muscle 6 inches above the knee.  No Problems encountered.  Arta Bruce MD   Popliteal block Procedure Name: MAC Date/Time: 02/21/2013 7:35 AM Performed by: Christinna Sprung D Pre-anesthesia Checklist: Patient identified, Emergency Drugs available, Suction available, Patient being monitored and Timeout performed Patient Re-evaluated:Patient Re-evaluated prior to inductionOxygen Delivery Method: Simple face mask    Procedure Name: Intubation Date/Time: 02/21/2013 8:09  AM Performed by: Raeford Brandenburg D Pre-anesthesia Checklist: Patient identified, Emergency Drugs available, Suction available and Patient being monitored Patient Re-evaluated:Patient Re-evaluated prior to inductionOxygen Delivery Method: Circle System Utilized Preoxygenation: Pre-oxygenation with 100% oxygen Intubation Type: IV induction Laryngoscope Size: Mac and 3 Grade View: Grade II Tube type: Oral Tube size: 7.0 mm Number of attempts: 2 Airway Equipment and Method: stylet and oral airway Placement Confirmation: ETT inserted through vocal cords under direct vision,  positive ETCO2 and breath sounds checked- equal and bilateral Secured at: 21 cm Tube secured with: Tape Dental Injury: Teeth and Oropharynx as per pre-operative assessment  Comments: DL performed by Dr Michelle Piper

## 2013-02-21 NOTE — Progress Notes (Signed)
Dr Garnette Scheuermann into see pt feels pt can stay at center overnight.  Pt and family very agreeable with this

## 2013-02-21 NOTE — Op Note (Signed)
NAMEJAKIAH, [name]                 ACCOUNT NO.:  [id number]  MEDICAL RECORD NO.:  [id number]  LOCATION:                                 FACILITY:  PHYSICIAN:  Toni Arthurs, MD             DATE OF BIRTH:  DATE OF PROCEDURE:  02/21/2013 DATE OF DISCHARGE:                              OPERATIVE REPORT   PREOPERATIVE DIAGNOSES: 1. Left Achilles tendon avulsion from the calcaneus. 2. Left tight heel cord. 3. Left Haglund deformity.  POSTOPERATIVE DIAGNOSES: 1. Left Achilles tendon avulsion from the calcaneus. 2. Left tight heel cord. 3. Left Haglund deformity.  PROCEDURES: 1. Left gastrocnemius recession. 2. Aborted Achilles tendon repair and Haglund excision. 3. Application of short-leg cast in equinus.  SURGEON:  Toni Arthurs, MD  ANESTHESIA:  Regional converted to general.  ESTIMATED BLOOD LOSS:  Minimal.  TOURNIQUET TIME:  14 minutes at 220 mmHg.  COMPLICATIONS:  Intraoperative asystole requiring emergency intubation, chest compressions, and IV atropine.  DISPOSITION:  Extubated awake and stable to recovery.  INDICATIONS FOR PROCEDURE:  The patient is a 62 year old woman with past medical history significant for smoking.  She sustained an avulsion of her Achilles tendon from the calcaneus approximately 2 weeks ago. Shortly after diagnosis, she was taken to the operating room for attempted repair.  She had difficult intubation and the surgical attempt was postponed.  She then underwent extensive preanesthetic evaluation and was cleared for surgery.  We also had a long discussion at that time about the risks and benefits of the alternative treatment options.  She specifically understood risks of bleeding, infection, nerve damage, blood clots, need for additional surgery, amputation, anesthetic complications, and death.  She presents now for operative treatment of this condition.  PROCEDURE IN DETAIL:  After preoperative consent was obtained, the correct operative  site was identified.  The patient was brought to the operating room supine on a gurney.  Regional anesthesia had previously been administered.  The patient was then turned into the prone position on the operating table.  IV sedation was administered.  Left lower extremity was prepped and draped in standard sterile fashion with a tourniquet around the thigh.  The extremity was exsanguinated and tourniquet was inflated to 220 mmHg.  A longitudinal incision was made at the level of the calf.  Sharp dissection was carried down through the skin and subcutaneous tissue.  Care was taken to protect the lesser saphenous vein, the sural nerve.  The gastrocnemius tendon was identified.  It was divided under direct vision from medial to lateral. The wound was then irrigated copiously and inverted simple sutures of 3- 0 Monocryl were used to close the subcutaneous tissue and a running 3-0 nylon was used to close the skin incision.  Attention was then turned to the distal aspect of the heel.  A longitudinal incision was made through the skin.  Sharp dissection was carried down through the skin and subcutaneous tissue.  The peritenon was incised.  At this point in the case, the anesthetist recognized poor respiratory effort.  The patient then went in to asystole.  The left lower extremity was wrapped in the  sterile dressings and the patient was then turned into the supine position on a stretcher.  Chest compressions were begun.  The patient was intubated emergently by Dr. Michelle Piper.  The patient returned to normal sinus rhythm promptly.  The patient was stabilized.  When she was stable rather the heel wound was irrigated copiously.  The peritenon was closed with running 3-0 Monocryl suture. The skin incision was closed with a running 3-0 nylon.  Sterile dressings were applied to both wounds followed by compression dressing. The patient was then awakened from anesthesia and extubated.  She was transported  to the recovery room in stable condition.  FOLLOWUP PLAN:  The patient will be evaluated for this event including EKG, troponin, and blood gas.  Plan will be to put her in a cast in the recovery room in plantar flexion, and treat this avulsion in closed fashion.  Dr. Michelle Piper, and I have met with the patient's husband immediately after the patient was returned to the recovery room.  We explained these issues to he and his daughter by telephone.  They understand the plan and agree.     Toni Arthurs, MD     JH/MEDQ  D:  02/21/2013  T:  02/21/2013  Job:  [id number]

## 2013-02-21 NOTE — Transfer of Care (Signed)
Immediate Anesthesia Transfer of Care Note  Patient: [name]  Procedure(s) Performed: Procedure(s): GASTROCNEMIUS RECESSION (Left)  Patient Location: PACU  Anesthesia Type:GA combined with regional for post-op pain  Level of Consciousness: awake, alert , oriented and patient cooperative  Airway & Oxygen Therapy: Patient Spontanous Breathing and Patient connected to face mask oxygen  Post-op Assessment: Report given to PACU RN and Post -op Vital signs reviewed and stable  Post vital signs: Reviewed and stable  Complications: No apparent anesthesia complications

## 2013-02-21 NOTE — Progress Notes (Signed)
Assisted Dr. Ossey with left, ultrasound guided, popliteal/saphenous block. Side rails up, monitors on throughout procedure. See vital signs in flow sheet. Tolerated Procedure well. 

## 2013-02-21 NOTE — Consult Note (Signed)
Admit date: 02/21/2013 Referring Physician  K. Ossie M.D. Primary Physician  Neldon Labella, M.D. Primary Cardiologist  Armanda Magic, M.D. Reason for Consultation  bradycardia and asystole, transient  ASSESSMENT: 1. Bradycardia asystolic cardiac event occurring during surgery with resolution after intubation and atropine. Etiology uncertain. Suspect respiratory depression versus vasovagal mechanism . No clinical evidence to suggest myocardial ischemia  2. History of PSVT. No prior history of bradycardia. No normal LV function. February 2012 nuclear study negative for ischemia with EF of 70%  3. Left Achilles tendon rupture, status post attempted repair x2  4. Anxiety and depression  PLAN:  1. No further cardiac evaluation unless symptoms or laboratory data suggests further workup needed  2. Would observe the patient in hospital overnight given the events that occurred during surgery.  3. No specific cardiac therapy needed  4. Replete potassium   HPI: The patient is 62 and was undergoing a left Achilles tendon repair in the prone position when she developed bradycardia asystolic rhythm and required brief CPR, intubation, and atropine. The heart rhythm and patient color return promptly with the interventions. She was later successfully extubated and over the past 4 hours is asymptomatic. She specifically denies chest discomfort, dyspnea, headache, abdominal pain cough, and palpitations.   When asked to see the patient for an opinion regarding discharge today versus observation in the hospital.   PMH:   Past Medical History  Diagnosis Date  . Dysrhythmia     tachycardia  . Multiple allergies   . Headache(784.0)   . Arthritis   . Anxiety   . Depression   . Hypothyroidism   . Chronic cough   . Wears glasses   . PONV (postoperative nausea and vomiting)     has to use scop patch     PSH:   Past Surgical History  Procedure Laterality Date  . Abdominal hysterectomy  1988    . Appendectomy      with hyst  . Hemorrhoid surgery  2010    [facility]  . Strabismus surgery      age 63 x3  . Wisdom tooth extraction    . Elbow bursa surgery  2012    lt  . Colonoscopy      x3    Allergies:  Dilaudid Prior to Admit Meds:   Prescriptions prior to admission  Medication Sig Dispense Refill  . albuterol (PROVENTIL HFA;VENTOLIN HFA) 108 (90 BASE) MCG/ACT inhaler Inhale 2 puffs into the lungs every 6 (six) hours as needed for wheezing.      Marland Kitchen aspirin 325 MG tablet Take 325 mg by mouth daily.      Marland Kitchen atenolol (TENORMIN) 25 MG tablet Take 25 mg by mouth daily.      Marland Kitchen azelastine (ASTELIN) 137 MCG/SPRAY nasal spray Place 1 spray into the nose every morning. Use in each nostril as directed      . calcium-vitamin D (OSCAL WITH D) 500-200 MG-UNIT per tablet Take 1 tablet by mouth daily.      . cetirizine-pseudoephedrine (ZYRTEC-D) 5-120 MG per tablet Take 1 tablet by mouth 2 (two) times daily.      . fish oil-omega-3 fatty acids 1000 MG capsule Take 2 g by mouth daily.      . fluticasone (FLONASE) 50 MCG/ACT nasal spray Place 2 sprays into the nose every evening.      Marland Kitchen levothyroxine (SYNTHROID, LEVOTHROID) 75 MCG tablet Take 75 mcg by mouth daily before breakfast.      . LORazepam (ATIVAN)  2 MG tablet Take 2 mg by mouth 2 (two) times daily as needed for anxiety.      . montelukast (SINGULAIR) 10 MG tablet Take 10 mg by mouth at bedtime.      . Multiple Vitamins-Minerals (MULTIVITAMIN WITH MINERALS) tablet Take 1 tablet by mouth daily.      Marland Kitchen rOPINIRole (REQUIP) 0.5 MG tablet Take 0.5 mg by mouth at bedtime.      . sertraline (ZOLOFT) 100 MG tablet Take 100 mg by mouth daily.      Marland Kitchen zolmitriptan (ZOMIG) 5 MG tablet Take 5 mg by mouth as needed for migraine.       Fam HX:   History reviewed. No pertinent family history. Social HX:    History   Social History  . Marital Status: Married    Spouse Name: N/A    Number of Children: N/A  . Years of Education: N/A   Occupational  History  . Not on file.   Social History Main Topics  . Smoking status: Current Every Day Smoker -- 0.50 packs/day  . Smokeless tobacco: Not on file  . Alcohol Use: Not on file  . Drug Use: Yes     Comment: rare  . Sexually Active: Not on file     Comment: trying to cut down   Other Topics Concern  . Not on file   Social History Narrative  . No narrative on file     Review of Systems: Chronic dyspnea. History of PSVT and PACs. No history of coronary disease or heart failure.  Physical Exam: Blood pressure 101/54, pulse 71, temperature 97.8 F (36.6 C), temperature source Oral, resp. rate 20, height 5' (1.524 m), weight 71.85 kg (158 lb 6.4 oz), SpO2 96.00%. Weight change:   Obese and in no distress. Sitting at the bedside. Family around her.No JVD is noted. No carotid bruits are heard. lungs are clear anteriorly and posteriorly.Cardiac exam  reveals no murmur, rub, click, or gallop. The abdomen is soft with good bowel sounds. No tenderness is noted. Right lower extremity reveals no obvious edema.There is faint left facial droop but according to the family this is her typical appearance  Labs:   Lab Results  Component Value Date   WBC 12.3* 10/10/2008   HGB 13.9 02/21/2013   HCT 41.0 02/21/2013   MCV 87.6 10/10/2008   PLT 330 10/10/2008    Recent Labs Lab 02/21/13 0706  NA 139  K 3.3*  CL 101  BUN 7  CREATININE 0.60  GLUCOSE 109*   Lab Results  Component Value Date   CKTOTAL 49 02/21/2013   CKMB 1.3 02/21/2013   TROPONINI <0.30 02/21/2013        Radiology:  Dg Chest Port 1 View  02/21/2013   *RADIOLOGY REPORT*  Clinical Data: Post cardiac arrest, diminished breath sounds  PORTABLE CHEST - 1 VIEW  Comparison: 01/04/2011  Findings: Again noted elevation of the right hemidiaphragm.  Mild thoracic dextroscoliosis.  There is right basilar atelectasis or infiltrate.  No pneumothorax.  No pulmonary edema.  There is a linear lucency right base.  Free abdominal air cannot be excluded.  Clinical correlation is necessary.  If free abdominal air is suspected further evaluation with CT scan is recommended.  IMPRESSION: Mild thoracic dextroscoliosis.  There is right basilar atelectasis or infiltrate.  No pneumothorax.  No pulmonary edema.  There is a linear lucency right base.  Free abdominal air cannot be excluded. Clinical correlation is necessary.  If free abdominal  air is suspected further evaluation with CT scan is recommended.  Critical findings discussed with the patient's nurse Victorino Dike in the PACU   Original Report Authenticated By: Natasha Mead, M.D.   EKG:  An EKG done in the recovery area at around 10 AM demonstrated normal sinus rhythm with no acute ST-T wave changes. Poor R wave progression similar to prior tracings on record was noted.    Lesleigh Noe 02/21/2013 3:26 PM

## 2013-02-21 NOTE — Progress Notes (Signed)
Called stat to OR 5 where pt undergoing foot surgery under regional anesthesia in the prone position, suddenly deteriorated and developed bradycardia and asystole. Immediately prior to episode the patients saturation had been 100%. Patient immediately flipped supine on a stretcher and intubated by myself with easy view of vocal cords. ETCO2 positive with good breath sounds. Atropine 0.2mg  given IV. Chest compressions performed for about 30 seconds till heart rate in the 120's with a good pulse. Pt immediately pinked up. Dr Victorino Dike closed wounds while patient emerged. She had been given a total of of fentanyl, Versed 2mg  over a 90 minute period and a propofol infusion in the OR.  Narcan 0.1 mg was given IV with immediate responsiveness and she met criteria and responded to questions and was uneventfully extubated. Pt taken to PACU in stable condition with no apparent ill effects. Lower extremity block gave good pain relief. EKG was unchanged. ABG OK. Troponin and CKMB's were negative. CXR showed basilar atelectasis  I have put a call into the patients cardiologist Dr Armanda Magic for advice as how to monitor and follow her up. I have discussed the incident with the patient, her husband and daughter.

## 2013-02-21 NOTE — OR Nursing (Signed)
1610:  Procedure stopped because of patient's condition.  Patient repositioned emergently supine on stretcher for resuscitation.  See anesthesia notes.  After patient stable, second wound closed and dressing applied.

## 2013-02-21 NOTE — Anesthesia Preprocedure Evaluation (Addendum)
Anesthesia Evaluation  Patient identified by MRN, date of birth, ID band Patient awake and Patient unresponsive    Reviewed: Allergy & Precautions, H&P , NPO status , Patient's Chart, lab work & pertinent test results  History of Anesthesia Complications (+) PONV  Airway Mallampati: II TM Distance: >3 FB Neck ROM: Full    Dental   Pulmonary          Cardiovascular     Neuro/Psych  Headaches, PSYCHIATRIC DISORDERS Anxiety Depression    GI/Hepatic   Endo/Other  Hypothyroidism   Renal/GU      Musculoskeletal   Abdominal   Peds  Hematology   Anesthesia Other Findings   Reproductive/Obstetrics                          Anesthesia Physical Anesthesia Plan  ASA: II  Anesthesia Plan: General   Post-op Pain Management:    Induction: Intravenous  Airway Management Planned: Oral ETT  Additional Equipment:   Intra-op Plan:   Post-operative Plan: Extubation in OR  Informed Consent: I have reviewed the patients History and Physical, chart, labs and discussed the procedure including the risks, benefits and alternatives for the proposed anesthesia with the patient or authorized representative who has indicated his/her understanding and acceptance.     Plan Discussed with: CRNA and Surgeon  Anesthesia Plan Comments:         Anesthesia Quick Evaluation

## 2013-02-22 ENCOUNTER — Encounter (HOSPITAL_BASED_OUTPATIENT_CLINIC_OR_DEPARTMENT_OTHER): Payer: Self-pay | Admitting: Orthopedic Surgery

## 2013-02-22 LAB — BASIC METABOLIC PANEL
CO2: 33 mEq/L — ABNORMAL HIGH (ref 19–32)
Calcium: 8.8 mg/dL (ref 8.4–10.5)
Chloride: 100 mEq/L (ref 96–112)
Glucose, Bld: 98 mg/dL (ref 70–99)
Potassium: 3.1 mEq/L — ABNORMAL LOW (ref 3.5–5.1)
Sodium: 140 mEq/L (ref 135–145)

## 2013-02-22 LAB — TROPONIN I: Troponin I: <0.3 ng/mL (ref <0.30)

## 2013-02-22 MED ORDER — POTASSIUM CHLORIDE CRYS ER 20 MEQ PO TBCR
30.0000 meq | EXTENDED_RELEASE_TABLET | Freq: Once | ORAL | Status: AC
  Administered 2013-02-22: 30 meq via ORAL
  Filled 2013-02-22: qty 1

## 2013-02-22 NOTE — Progress Notes (Signed)
Dr Krista Blue by to check on pt.  Alerted him of 91% o2 sat on room air.  Also alerted him pt. Spouse states pt. Was 89% at home when checked 3 weeks ago.  Dr. Krista Blue planning on touching base with Dr Victorino Dike prior to discharge.

## 2013-02-22 NOTE — Progress Notes (Signed)
Dr Pilar Jarvis at pt. Bedside assessing pt.  Orders received verbally via Dr Mayford Knife.  Will continue to monitor closely.  Labs drawn per orders, awaiting results.  Pt up for ADLs with minimal assistance.  NAD, spouse at side.  Knee walker used by pt for assist.

## 2013-02-22 NOTE — Progress Notes (Signed)
Subjective: 1 Day Post-Op Procedure(s) (LRB): GASTROCNEMIUS RECESSION (Left) Patient reports pain as mild.  Taking no pain meds.  Denies SOB, CP or calf pain.  Objective: Vital signs in last 24 hours: Temp:  [97.8 F (36.6 C)-98.6 F (37 C)] 98.4 F (36.9 C) (06/06 0730) Pulse Rate:  [55-107] 74 (06/06 0730) Resp:  [14-24] 18 (06/06 0730) BP: (88-140)/(46-88) 140/70 mmHg (06/06 0730) SpO2:  [88 %-99 %] 99 % (06/06 0730) Sats drop to mid to upper 80s on RA when moving around.  Intake/Output from previous day: 06/05 0701 - 06/06 0700 In: 3192 [P.O.:2192; I.V.:1000] Out: 2250 [Urine:2150; Emesis/NG output:100] Intake/Output this shift:     Recent Labs  02/21/13 0706  HGB 13.9    Recent Labs  02/21/13 0706  HCT 41.0    Recent Labs  02/21/13 0706  NA 139  K 3.3*  CL 101  BUN 7  CREATININE 0.60  GLUCOSE 109*    PE:  WN WD woman in nad.  A and O.  Mood and affec tnormal.  EOMI.  Resp unlabored.  L LE casted.  Assessment/Plan: 1 Day Post-Op Procedure(s) (LRB): GASTROCNEMIUS RECESSION (Left)  Pt discussed with Dr. Krista Blue.  At this point she's essentially back to baseline despite asystole intra operatively yesterday.  Dr. Krista Blue will contact Dr. Katrinka Blazing of cardiology to determine whether to keep her for another day or allow her to go home.  From ortho perspective she can be discharged safely.  Toni Arthurs 02/22/2013, 7:58 AM

## 2013-02-22 NOTE — Progress Notes (Signed)
Subjective:  Spoke to both Dr. Katrinka Blazing (who saw yesterday) and Dr. Mayford Knife on the phone.   Overnight, mild nausea. Now feeling OK. No CP, no SOB.  Recounted events of yesterday. Takes Atenolol 25mg  PO QD for SVT (HR 120's at times).   No history of MI, PE, DVT.   Objective:  Vital Signs in the last 24 hours: Temp:  [98 F (36.7 C)-98.6 F (37 C)] 98.4 F (36.9 C) (06/06 0730) Pulse Rate:  [55-86] 76 (06/06 0845) Resp:  [14-24] 18 (06/06 0730) BP: (88-140)/(46-74) 140/70 mmHg (06/06 0730) SpO2:  [88 %-99 %] 97 % (06/06 0845)  Intake/Output from previous day: 06/05 0701 - 06/06 0700 In: 3192 [P.O.:2192; I.V.:1000] Out: 2250 [Urine:2150; Emesis/NG output:100]   Physical Exam: General: Well developed, well nourished, in no acute distress. Head:  Normocephalic and atraumatic. Lungs: Clear to auscultation, except mild wheeze RLL. Heart: Normal S1 and S2.  No murmur, rubs or gallops.  Abdomen: soft, non-tender, positive bowel sounds. Extremities: No clubbing or cyanosis. No edema. Neurologic: Alert and oriented x 3.    Lab Results:  Recent Labs  02/21/13 0706  HGB 13.9    Recent Labs  02/21/13 0706  NA 139  K 3.3*  CL 101  GLUCOSE 109*  BUN 7  CREATININE 0.60    Recent Labs  02/21/13 0933  TROPONINI <0.30   Imaging: Dg Chest Port 1 View  02/21/2013   *RADIOLOGY REPORT*  Clinical Data: Post cardiac arrest, diminished breath sounds  PORTABLE CHEST - 1 VIEW  Comparison: 01/04/2011  Findings: Again noted elevation of the right hemidiaphragm.  Mild thoracic dextroscoliosis.  There is right basilar atelectasis or infiltrate.  No pneumothorax.  No pulmonary edema.  There is a linear lucency right base.  Free abdominal air cannot be excluded. Clinical correlation is necessary.  If free abdominal air is suspected further evaluation with CT scan is recommended.  IMPRESSION: Mild thoracic dextroscoliosis.  There is right basilar atelectasis or infiltrate.  No pneumothorax.  No  pulmonary edema.  There is a linear lucency right base.  Free abdominal air cannot be excluded. Clinical correlation is necessary.  If free abdominal air is suspected further evaluation with CT scan is recommended.  Critical findings discussed with the patient's nurse Victorino Dike in the PACU   Original Report Authenticated By: Natasha Mead, M.D.   Personally viewed.   Telemetry: NSR 80 Personally viewed.   EKG:  NSR no ST changes. Personally viewed by myself and Dr. Mayford Knife  Cardiac Studies:  Recent NUC stress low risk .  Assessment/Plan:    1. Bradycardic asystolic cardiac event during surgery with resolution after intubation/atopine.  - agree with Dr. Katrinka Blazing. Suspect respiratory depression versus vasovagal mechanism . No clinical evidence to suggest myocardial ischemia  - Initial troponin normal  - Keep K >4.   - Awaiting stat labs this am (BMET, Trop, CK MB ordered by Dr. Mayford Knife)  - If normal, OK for DC home.   - Dr. Lavina Hamman are to stop atenolol 25mg  PO QD and have as PRN medication in case of SVT.   - May need further eval for sleep apnea.   Has follow up with Dr. Mayford Knife Next Wed at 8:45am.    Anne Fu, MARK 02/22/2013, 9:45 AM

## 2013-07-25 ENCOUNTER — Other Ambulatory Visit: Payer: Self-pay

## 2013-07-29 ENCOUNTER — Encounter: Payer: Self-pay | Admitting: Cardiology

## 2013-09-20 ENCOUNTER — Encounter: Payer: Self-pay | Admitting: General Surgery

## 2013-09-20 DIAGNOSIS — G4733 Obstructive sleep apnea (adult) (pediatric): Secondary | ICD-10-CM

## 2013-10-14 ENCOUNTER — Encounter: Payer: Self-pay | Admitting: Cardiology

## 2013-10-14 ENCOUNTER — Ambulatory Visit (INDEPENDENT_AMBULATORY_CARE_PROVIDER_SITE_OTHER): Payer: 59 | Admitting: Cardiology

## 2013-10-14 VITALS — BP 120/76 | HR 74 | Ht 60.0 in | Wt 166.0 lb

## 2013-10-14 DIAGNOSIS — R0902 Hypoxemia: Secondary | ICD-10-CM | POA: Insufficient documentation

## 2013-10-14 DIAGNOSIS — E669 Obesity, unspecified: Secondary | ICD-10-CM | POA: Insufficient documentation

## 2013-10-14 DIAGNOSIS — G2581 Restless legs syndrome: Secondary | ICD-10-CM

## 2013-10-14 DIAGNOSIS — G4733 Obstructive sleep apnea (adult) (pediatric): Secondary | ICD-10-CM

## 2013-10-14 NOTE — Progress Notes (Signed)
[place], [address], [place]  [address] Phone: [phone] Fax:  [phone]  Date:  10/14/2013   ID:  [name], DOB [birth year], MRN [id number]  PCP:  Shirline Frees, MD  Sleep Medicine:  Fransico Him, MD   History of Present Illness: [name] is a 63 y.o. female with a history of OSA, obesity and RLS who presents today for followup.  She is doing well.  She tolerates her CPAP well.  She tolerates her nasal  mask and feels the pressure is adequate.  She feels rested in the am and has no daytime sleepiness.  Her download today showed an AHI of 1/hr on 13cm H2O and 98% compliance in using more than 4 hours nightly.  She says that she occasionally has some problems with restless legs but as long as she takes her requip she is fine.  She is sleeping a lot better on her machine.    Wt Readings from Last 3 Encounters:  06/06/13 164 lb 12.8 oz (74.753 kg)  02/21/13 158 lb 6.4 oz (71.85 kg)  02/21/13 158 lb 6.4 oz (71.85 kg)     Past Medical History  Diagnosis Date  . Multiple allergies   . Headache(784.0)   . Arthritis   . Anxiety   . Depression   . Hypothyroidism   . Chronic cough   . Wears glasses   . PONV (postoperative nausea and vomiting)     has to use scop patch  . Adenomatous polyps   . Cancer 02/2010    basal cell skin cancer  . Hyperlipidemia   . OSA (obstructive sleep apnea)     on CPAP  . RLS (restless legs syndrome)   . Tachycardia, paroxysmal     sinus tachycardia  . Bradycardia, sinus     with cardiac arrest in setting of respiratory arrest during anesthesia - cardiac workup with echo and nuclear showed no ischemia and normal LVF    Current Outpatient Prescriptions  Medication Sig Dispense Refill  . albuterol (PROVENTIL HFA;VENTOLIN HFA) 108 (90 BASE) MCG/ACT inhaler Inhale 2 puffs into the lungs every 6 (six) hours as needed for wheezing.      Marland Kitchen aspirin 325 MG tablet Take 325 mg by mouth daily.      Marland Kitchen atenolol (TENORMIN) 25 MG  tablet Take 25 mg by mouth daily.      Marland Kitchen azelastine (ASTELIN) 137 MCG/SPRAY nasal spray Place 1 spray into the nose every morning. Use in each nostril as directed      . b complex vitamins capsule Take 1 capsule by mouth daily.      . calcium-vitamin D (OSCAL WITH D) 500-200 MG-UNIT per tablet Take 1 tablet by mouth daily.      . cetirizine-pseudoephedrine (ZYRTEC-D) 5-120 MG per tablet Take 1 tablet by mouth 2 (two) times daily.      Marland Kitchen docusate sodium (COLACE) 100 MG capsule Take 100 mg by mouth daily as needed for mild constipation.      . fish oil-omega-3 fatty acids 1000 MG capsule Take 2 g by mouth daily.      . Flaxseed, Linseed, (FLAX SEED OIL) 1000 MG CAPS Take 1,000 mg by mouth daily.      . fluticasone (FLONASE) 50 MCG/ACT nasal spray Place 2 sprays into the nose every evening.      Marland Kitchen HYDROcodone-acetaminophen (NORCO) 5-325 MG per tablet Take 1 tablet by mouth every 6 (six) hours as needed for pain.  [place]  tablet  0  . levothyroxine (SYNTHROID, LEVOTHROID) 75 MCG tablet Take 75 mcg by mouth daily before breakfast.      . LORazepam (ATIVAN) 2 MG tablet Take 2 mg by mouth 2 (two) times daily as needed for anxiety.      . montelukast (SINGULAIR) 10 MG tablet Take 10 mg by mouth at bedtime.      . Multiple Vitamins-Minerals (MULTIVITAMIN WITH MINERALS) tablet Take 1 tablet by mouth daily.      . potassium chloride SA (K-DUR,KLOR-CON) 20 MEQ tablet Take 20 mEq by mouth daily.      Marland Kitchen rOPINIRole (REQUIP) 0.5 MG tablet Take 0.5 mg by mouth at bedtime.      . sertraline (ZOLOFT) 100 MG tablet Take 100 mg by mouth daily.      Marland Kitchen zolmitriptan (ZOMIG) 5 MG tablet Take 5 mg by mouth as needed for migraine.       No current facility-administered medications for this visit.    Allergies:    Allergies  Allergen Reactions  . Dilaudid [Hydromorphone Hcl] Nausea And Vomiting    tachycardia    Social History:  The patient  reports that she quit smoking about 8 months ago. She does not have any  smokeless tobacco history on file. She reports that she uses illicit drugs. She reports that she does not drink alcohol.   Family History:  The patient's family history includes Arrhythmia in her father; CAD in her mother; CVA in her father; Cancer - Colon in her paternal uncle and paternal uncle; Colon polyps in her mother; Parkinson's disease in her mother.   ROS:  Please see the history of present illness.      All other systems reviewed and negative.   PHYSICAL EXAM: VS:  There were no vitals taken for this visit. Well nourished, well developed, in no acute distress HEENT: normal Neck: no JVD Cardiac:  normal S1, S2; RRR; no murmur Lungs:  clear to auscultation bilaterally, no wheezing, rhonchi or rales Abd: soft, nontender, no hepatomegaly Ext: no edema Skin: warm and dry Neuro:  CNs 2-12 intact, no focal abnormalities noted       ASSESSMENT AND PLAN:  1. OSA on CPAP - she will continue on her current medical regimen 2. Restless legs syndrome  - continue Requip 3. Obesity - I have encouraged her to get into a routine exercise program with walking and follow a low fat and low carb diet and watch her portions.   4. Low O2 sats - her sats at night on CPAP are about 94% but today in the office it is 90%.    - Given her history last year of respiratory arrest and now low O2 sats in office I think she needs a full pulmonary eval.  She has no evidence of CHF on exam today and 2D echo last year was normal.  She has a history of tobacco use.  Followup with me in 6 months  Signed, Fransico Him, MD 10/14/2013 11:29 AM

## 2013-10-14 NOTE — Patient Instructions (Signed)
Your physician recommends that you continue on your current medications as directed. Please refer to the Current Medication list given to you today.  You have been referred to [facility] for O2 sats in office being 86 ( Dr. Radford Pax wants pt seen this week)  Your physician wants you to follow-up in: 6 Months with Dr Mallie Snooks will receive a reminder letter in the mail two months in advance. If you don't receive a letter, please call our office to schedule the follow-up appointment.

## 2013-10-15 ENCOUNTER — Ambulatory Visit (INDEPENDENT_AMBULATORY_CARE_PROVIDER_SITE_OTHER)
Admission: RE | Admit: 2013-10-15 | Discharge: 2013-10-15 | Disposition: A | Discharge status: Home / Self Care | Payer: 59 | Source: Ambulatory Visit | Attending: Pulmonary Disease | Admitting: Pulmonary Disease

## 2013-10-15 ENCOUNTER — Encounter: Payer: Self-pay | Admitting: Pulmonary Disease

## 2013-10-15 ENCOUNTER — Ambulatory Visit (INDEPENDENT_AMBULATORY_CARE_PROVIDER_SITE_OTHER): Payer: 59 | Admitting: Pulmonary Disease

## 2013-10-15 ENCOUNTER — Other Ambulatory Visit: Payer: Self-pay | Admitting: Pulmonary Disease

## 2013-10-15 VITALS — BP 130/80 | HR 79 | Temp 98.1°F | Ht 60.0 in | Wt 168.6 lb

## 2013-10-15 DIAGNOSIS — R0902 Hypoxemia: Secondary | ICD-10-CM

## 2013-10-15 NOTE — Assessment & Plan Note (Addendum)
The patient has resting oxygen levels that are lower than expected, but surprisingly this is not affecting her activities of daily living. From her history and exam, I do not see any obvious intrinsic lung disease. She has had PFTs in 2012 that showed no airflow obstruction but did show mild restriction. She apparently has had a cardiac evaluation last year that was unremarkable. 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 I suspect this is a aeration problem related to restriction, leading to segmental atelectasis.  [place] recheck her pulmonary function studies, and will also need to do fluoroscopy to evaluate her right hemidiaphragm for paralysis. Will also review her echocardiogram from last year. Ultimately, I suspect the only way she will improve is to work on weight loss and strengthen her muscles of respiration.

## 2013-10-15 NOTE — Progress Notes (Signed)
   Subjective:    Patient ID: [name], female    DOB: 1950-10-13, 63 y.o.   MRN: [id number]  HPI The patient is a 63 year old female who I've been asked to see for resting hypoxemia. She has been found to have resting oxygen saturations in the 90-92 range, and is referred for further workup. She has a long history of smoking, but quit in 2014. She had PFTs in 2012 that showed no airflow obstruction, but mild restriction noted. 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 She apparently has had a recent cardiac evaluation by Dr. Radford Pax, with nothing specific being found. Despite having a low resting oxygen saturation on room air, the patient feels that her breathing is adequate with activities of daily living. She is able to walk fairly long distances without shortness of breath if she paces herself, and can do light housework or bringing groceries in from the car without significant dyspnea. She will get short of breath walking up one flight of stairs. She has a history of allergies for which she sees an allergist, but has never been diagnosed with asthma. She does have a history of sleep apnea and restless leg syndrome, for which she is on CPAP and dopamine agonist. She has no history of thromboembolic disease. She also has a history of thyroid disorder, but apparently her medications have been properly adjusted according to her history. Finally, the patient states that her weight is up about 30 pounds over the last 4 years.   Review of Systems  Constitutional: Negative for fever and unexpected weight change.  HENT: Negative for congestion, dental problem, ear pain, nosebleeds, postnasal drip, rhinorrhea, sinus pressure, sneezing, sore throat and trouble swallowing.   Eyes: Negative for redness and itching.  Respiratory: Positive for cough, shortness of breath and wheezing. Negative for chest tightness.   Cardiovascular: Negative  for palpitations and leg swelling.  Gastrointestinal: Negative for nausea and vomiting.  Genitourinary: Negative for dysuria.  Musculoskeletal: Negative for joint swelling.  Skin: Negative for rash.  Neurological: Negative for headaches.  Hematological: Does not bruise/bleed easily.  Psychiatric/Behavioral: Negative for dysphoric mood. The patient is not nervous/anxious.        Objective:   Physical Exam Constitutional: obese female, no acute distress  HENT:  Nares patent without discharge, septal deviation to the left  Oropharynx without exudate, palate and uvula are mildly elongated.   Eyes:  Perrla, eomi, no scleral icterus  Neck:  No JVD, no TMG  Cardiovascular:  Normal rate, regular rhythm, no rubs or gallops.  No murmurs        Intact distal pulses  Pulmonary :  Normal breath sounds, no stridor or respiratory distress   Inspiratory pop right base, no definite wheezing.   Abdominal:  Soft, nondistended, bowel sounds present.  No tenderness noted.   Musculoskeletal:  No lower extremity edema noted.  Lymph Nodes:  No cervical lymphadenopathy noted  Skin:  No cyanosis noted  Neurologic:  Alert, appropriate, moves all 4 extremities without obvious deficit.         Assessment & Plan:

## 2013-10-15 NOTE — Patient Instructions (Signed)
Will check chest xray today, and call you with results. Will schedule for breathing studies, and also a test to evaluate your right diaphragm. Work on [employer] loss Will call once the results of your test is available.

## 2013-10-17 ENCOUNTER — Ambulatory Visit (HOSPITAL_COMMUNITY)
Admission: RE | Admit: 2013-10-17 | Discharge: 2013-10-17 | Disposition: A | Discharge status: Home / Self Care | Payer: 59 | Source: Ambulatory Visit | Attending: Pulmonary Disease | Admitting: Pulmonary Disease

## 2013-10-17 DIAGNOSIS — J986 Disorders of diaphragm: Secondary | ICD-10-CM | POA: Insufficient documentation

## 2013-10-17 DIAGNOSIS — R0902 Hypoxemia: Secondary | ICD-10-CM

## 2013-10-17 DIAGNOSIS — Z87891 Personal history of nicotine dependence: Secondary | ICD-10-CM | POA: Insufficient documentation

## 2013-10-17 LAB — PULMONARY FUNCTION TEST
DL/VA % pred: 100 %
DL/VA: 4.2 ml/min/mmHg/L
DLCO UNC: 10.69 ml/min/mmHg
DLCO unc % pred: 57 %
FEF 25-75 POST: 1.61 L/s
FEF 25-75 Pre: 1.97 L/sec
FEF2575-%Change-Post: -17 %
FEF2575-%Pred-Post: 83 %
FEF2575-%Pred-Pre: 101 %
FEV1-%CHANGE-POST: -3 %
FEV1-%PRED-POST: 72 %
FEV1-%PRED-PRE: 74 %
FEV1-POST: 1.48 L
FEV1-Pre: 1.53 L
FEV1FVC-%CHANGE-POST: -2 %
FEV1FVC-%Pred-Pre: 111 %
FEV6-%CHANGE-POST: 0 %
FEV6-%Pred-Post: 69 %
FEV6-%Pred-Pre: 69 %
FEV6-PRE: 1.79 L
FEV6-Post: 1.77 L
FEV6FVC-%PRED-PRE: 104 %
FEV6FVC-%Pred-Post: 104 %
FVC-%Change-Post: 0 %
FVC-%Pred-Post: 66 %
FVC-%Pred-Pre: 66 %
FVC-Post: 1.77 L
FVC-Pre: 1.79 L
POST FEV1/FVC RATIO: 83 %
POST FEV6/FVC RATIO: 100 %
PRE FEV6/FVC RATIO: 100 %
Pre FEV1/FVC ratio: 86 %
RV % PRED: 81 %
RV: 1.52 L
TLC % pred: 77 %
TLC: 3.44 L

## 2013-10-17 MED ORDER — ALBUTEROL SULFATE (2.5 MG/3ML) 0.083% IN NEBU
2.5000 mg | INHALATION_SOLUTION | Freq: Once | RESPIRATORY_TRACT | Status: AC
  Administered 2013-10-17: 2.5 mg via RESPIRATORY_TRACT

## 2013-10-18 ENCOUNTER — Telehealth: Payer: Self-pay | Admitting: *Deleted

## 2013-10-18 NOTE — Telephone Encounter (Signed)
I have called [place] [facility] and requested most recent ECHO(Dr Golden Hurter office)--Per Kim in [facility], these records are at [place] still and she will retreive these for me and fax them to our main fax #. Will let Dr Gwenette Greet know once received

## 2013-10-21 ENCOUNTER — Encounter: Payer: Self-pay | Admitting: Pulmonary Disease

## 2013-10-21 NOTE — Telephone Encounter (Signed)
ECHO reports received from [facility] Given to [facility] to review.

## 2013-10-24 ENCOUNTER — Other Ambulatory Visit: Payer: Self-pay | Admitting: Cardiology

## 2013-11-07 ENCOUNTER — Encounter: Payer: Self-pay | Admitting: Cardiology

## 2013-11-08 ENCOUNTER — Encounter: Payer: Self-pay | Admitting: Cardiology

## 2013-11-27 ENCOUNTER — Encounter: Payer: Self-pay | Admitting: Cardiology

## 2013-12-11 ENCOUNTER — Telehealth: Payer: Self-pay | Admitting: Pulmonary Disease

## 2013-12-11 NOTE — Telephone Encounter (Signed)
I never told the pt that I would call her about echo, just pfts and sniff test. I told her that I would review it  However, you can tell her the echo from 02/2013 showed no significant abnormality.

## 2013-12-11 NOTE — Telephone Encounter (Signed)
According to phone note 10/18/13: Virl Cagey, CMA at 10/21/2013 12:13 PM     Status: Signed        ECHO reports received from [facility]  Given to [facility] to review.  ---  Called and spoke with pt. She reports KC was going to call her once he reviews the results. Please advise [place] thanks

## 2013-12-12 NOTE — Telephone Encounter (Signed)
Spoke with the pt and notified of recs per KC  She verbalized understanding  Nothing further needed 

## 2013-12-12 NOTE — Telephone Encounter (Signed)
I spoke with patient about results and she verbalized understanding and had no questions She wants to know if [facility] needs to see her again then? Please advise thanks

## 2013-12-12 NOTE — Telephone Encounter (Signed)
She does not need followup with me unless something changes and her breathing gets significantly worse.

## 2013-12-12 NOTE — Telephone Encounter (Signed)
lmomtcb x1 for pt 

## 2014-04-08 ENCOUNTER — Encounter: Payer: Self-pay | Admitting: Cardiology

## 2014-05-08 ENCOUNTER — Ambulatory Visit: Payer: 59 | Admitting: Cardiology

## 2014-05-22 IMAGING — CR DG CHEST 1V PORT
1 series · 1 of 1 positions shown · non-contrast
Comparison: 01/04/2011

CLINICAL DATA: Post cardiac arrest, diminished breath sounds

PORTABLE CHEST - 1 VIEW

[view not recorded]
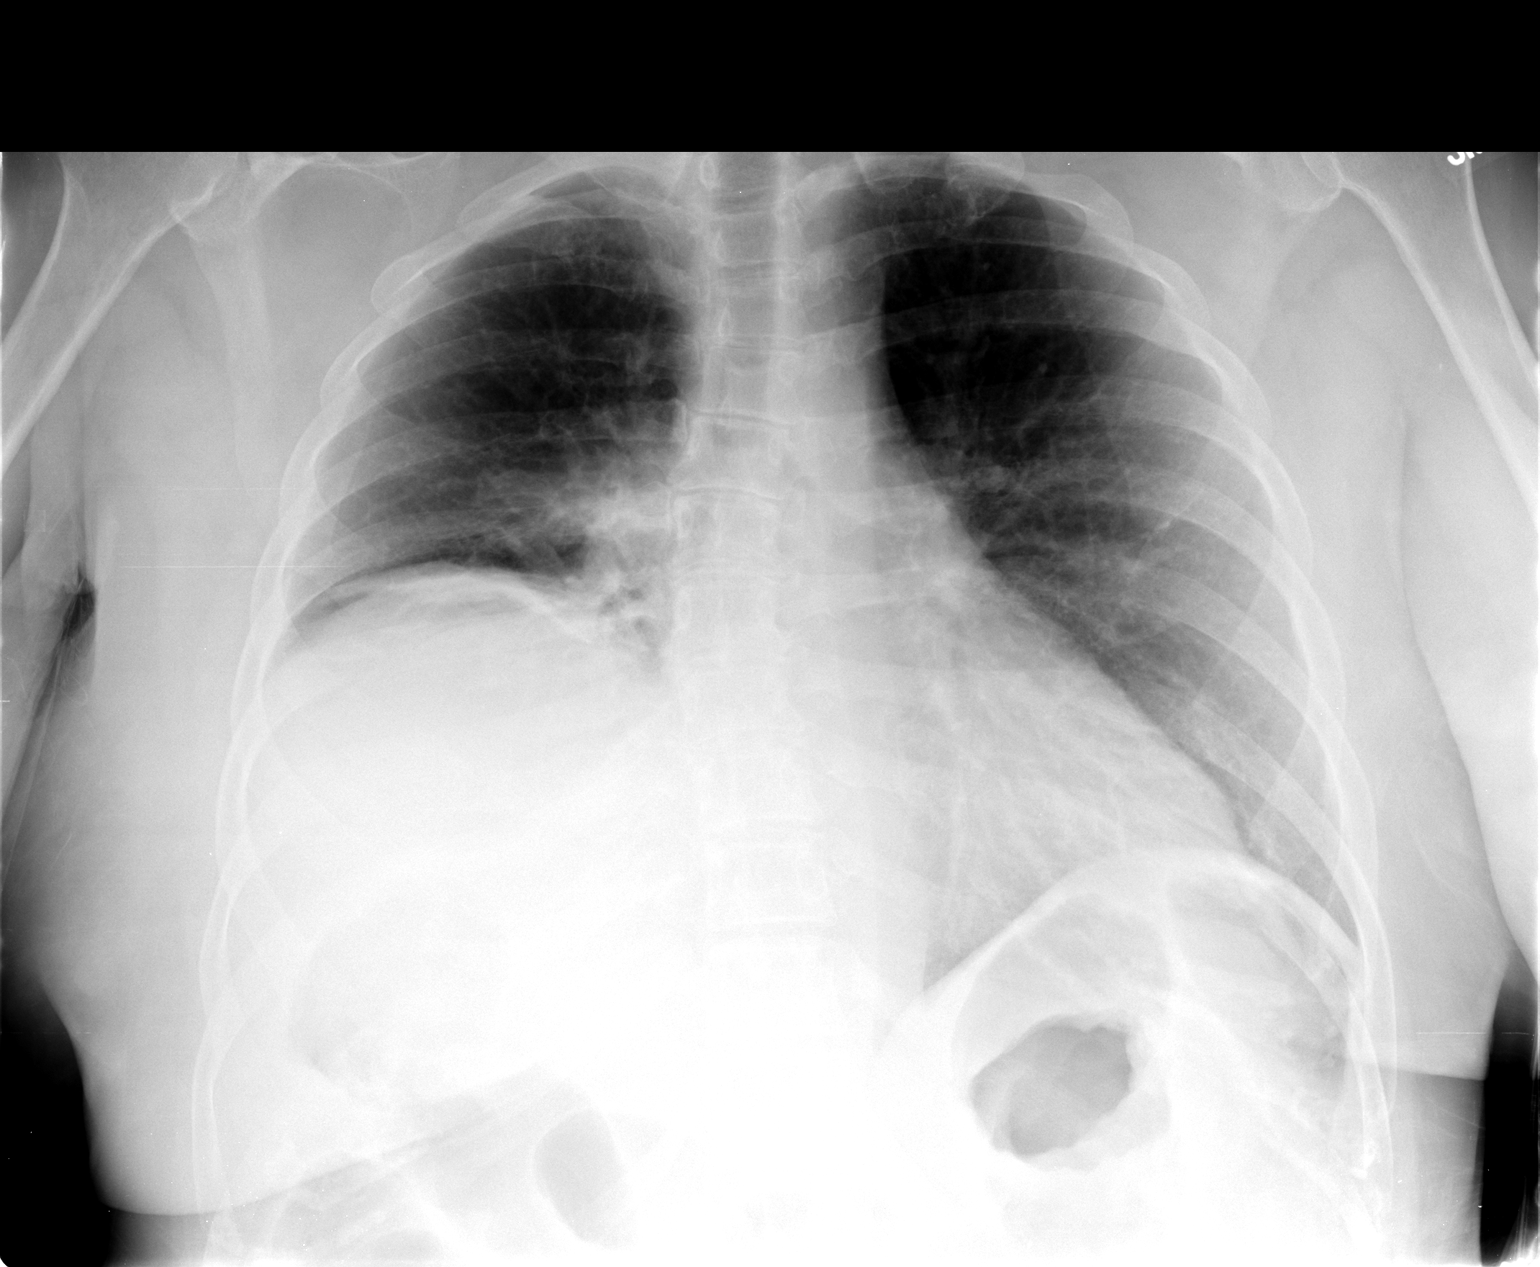

[1 of 1 positions shown; findings below may reference images not displayed]

FINDINGS: Again noted elevation of the right hemidiaphragm.  Mild
thoracic dextroscoliosis.  There is right basilar atelectasis or
infiltrate.  No pneumothorax.  No pulmonary edema.  There is a
linear lucency right base.  Free abdominal air cannot be excluded.
Clinical correlation is necessary.  If free abdominal air is
suspected further evaluation with CT scan is recommended.
IMPRESSION: Mild thoracic dextroscoliosis.  There is right basilar atelectasis
or infiltrate.  No pneumothorax.  No pulmonary edema.  There is a
linear lucency right base.  Free abdominal air cannot be excluded.
Clinical correlation is necessary.  If free abdominal air is
suspected further evaluation with CT scan is recommended.

Critical findings discussed with the patient's nurse Lucson in
the PACU

## 2014-07-07 ENCOUNTER — Encounter: Payer: Self-pay | Admitting: Cardiology

## 2014-07-07 ENCOUNTER — Ambulatory Visit (INDEPENDENT_AMBULATORY_CARE_PROVIDER_SITE_OTHER): Payer: 59 | Admitting: Cardiology

## 2014-07-07 VITALS — BP 128/86 | HR 62 | Ht 60.0 in | Wt 169.0 lb

## 2014-07-07 DIAGNOSIS — E669 Obesity, unspecified: Secondary | ICD-10-CM

## 2014-07-07 DIAGNOSIS — G2581 Restless legs syndrome: Secondary | ICD-10-CM

## 2014-07-07 DIAGNOSIS — G4733 Obstructive sleep apnea (adult) (pediatric): Secondary | ICD-10-CM

## 2014-07-07 NOTE — Progress Notes (Signed)
[place], [place], [place]  [address] Phone: [phone] Fax:  [phone]  Date:  07/07/2014   ID:  BARBERA PERRITT, DOB 06/04/1951, MRN [id number]  PCP:  Shirline Frees, MD  Cardiologist:  Fransico Him, MD    History of Present Illness: [name] is a 63 y.o. female with a history of OSA, obesity and RLS who presents today for followup. She is doing well. She tolerates her CPAP well. She tolerates her nasal mask and feels the pressure is adequate. She feels rested in the am and has no daytime sleepiness. Her download today showed an AHI of 1/hr on 13cm H2O and 98% compliance in using more than 4 hours nightly. She says that she occasionally has some problems with restless legs but as long as she takes her requip she is fine. She is sleeping a lot better on her machine.     Wt Readings from Last 3 Encounters:  07/07/14 169 lb (76.658 kg)  10/15/13 168 lb 9.6 oz (76.476 kg)  10/14/13 166 lb (75.297 kg)     Past Medical History  Diagnosis Date  . Multiple allergies   . Headache(784.0)   . Arthritis   . Anxiety   . Depression   . Hypothyroidism   . Chronic cough   . Wears glasses   . PONV (postoperative nausea and vomiting)     has to use scop patch  . Adenomatous polyps   . Cancer 02/2010    basal cell skin cancer  . Hyperlipidemia   . OSA (obstructive sleep apnea)     on CPAP  . RLS (restless legs syndrome)   . Tachycardia, paroxysmal     sinus tachycardia  . Bradycardia, sinus     with cardiac arrest in setting of respiratory arrest during anesthesia - cardiac workup with echo and nuclear showed no ischemia and normal LVF    Current Outpatient Prescriptions  Medication Sig Dispense Refill  . albuterol (PROVENTIL HFA;VENTOLIN HFA) 108 (90 BASE) MCG/ACT inhaler Inhale 2 puffs into the lungs every 6 (six) hours as needed for wheezing.      Marland Kitchen aspirin 325 MG tablet Take 325 mg by mouth daily.      Marland Kitchen atenolol (TENORMIN) 25 MG tablet Take 25 mg by  mouth daily.      Marland Kitchen azelastine (ASTELIN) 137 MCG/SPRAY nasal spray Place 1 spray into the nose as needed. Use in each nostril as directed      . b complex vitamins capsule Take 1 capsule by mouth daily.      . calcium-vitamin D (OSCAL WITH D) 500-200 MG-UNIT per tablet Take 1 tablet by mouth daily.      . cetirizine-pseudoephedrine (ZYRTEC-D) 5-120 MG per tablet Take 1 tablet by mouth daily.       Marland Kitchen docusate sodium (COLACE) 100 MG capsule Take 100 mg by mouth daily as needed for mild constipation.      Marland Kitchen estradiol (ESTRACE) 1 MG tablet       . fish oil-omega-3 fatty acids 1000 MG capsule Take 2 g by mouth daily.      . Flaxseed, Linseed, (FLAX SEED OIL) 1000 MG CAPS Take 1,000 mg by mouth daily.      . fluticasone (FLONASE) 50 MCG/ACT nasal spray Place 2 sprays into the nose as needed.       Marland Kitchen levothyroxine (SYNTHROID, LEVOTHROID) 75 MCG tablet Take 75 mcg by mouth daily before breakfast.      . LORazepam (  ATIVAN) 2 MG tablet Take 2 mg by mouth 2 (two) times daily as needed for anxiety.      . montelukast (SINGULAIR) 10 MG tablet Take 10 mg by mouth at bedtime.      . Multiple Vitamins-Minerals (MULTIVITAMIN WITH MINERALS) tablet Take 1 tablet by mouth daily.      . naproxen (EC NAPROSYN) 500 MG EC tablet Take 500 mg by mouth 2 (two) times daily with a meal.      . rOPINIRole (REQUIP) 0.5 MG tablet Take 0.5 mg by mouth at bedtime.      . sertraline (ZOLOFT) 100 MG tablet Take 100 mg by mouth daily.      Marland Kitchen zolmitriptan (ZOMIG) 5 MG tablet Take 5 mg by mouth as needed for migraine.       No current facility-administered medications for this visit.    Allergies:    Allergies  Allergen Reactions  . Dilaudid [Hydromorphone Hcl] Nausea And Vomiting    tachycardia    Social History:  The patient  reports that she quit smoking about 17 months ago. She does not have any smokeless tobacco history on file. She reports that she drinks alcohol. She reports that she does not use illicit drugs.    Family History:  The patient's family history includes Arrhythmia in her father; CAD in her mother; CVA in her father; Cancer - Colon in her paternal uncle and paternal uncle; Colon polyps in her mother; Parkinson's disease in her mother.   ROS:  Please see the history of present illness.      All other systems reviewed and negative.   PHYSICAL EXAM: VS:  BP 128/86  Pulse 62  Ht 5' (1.524 m)  Wt 169 lb (76.658 kg)  BMI 33.01 kg/m2 Well nourished, well developed, in no acute distress HEENT: normal Neck: no JVD Cardiac:  normal S1, S2; RRR; no murmur Lungs:  clear to auscultation bilaterally, no wheezing, rhonchi or rales Abd: soft, nontender, no hepatomegaly Ext: no edema Skin: warm and dry Neuro:  CNs 2-12 intact, no focal abnormalities noted  EKG:  NSR with no ST changes     ASSESSMENT AND PLAN:  1. OSA on CPAP - she will continue on her current CPAP settings 2. Restless legs syndrome - fairly well controlled on Requip - continue Requip  3. Obesity - I have encouraged her to get into a routine exercise program but she is limited currently due to foot pain.  She is  walking some.  I encouraged her to follow a low fat and low carb diet and watch her portions.   -Followup with me in 6 months   Signed, Fransico Him, MD [facility] 07/07/2014 1:58 PM

## 2014-07-07 NOTE — Patient Instructions (Signed)
Your physician wants you to follow-up in: 6 months with Dr. Turner. You will receive a reminder letter in the mail two months in advance. If you don't receive a letter, please call our office to schedule the follow-up appointment.  Your physician recommends that you continue on your current medications as directed. Please refer to the Current Medication list given to you today.  

## 2014-07-21 ENCOUNTER — Ambulatory Visit: Payer: 59 | Admitting: Cardiology

## 2014-07-21 ENCOUNTER — Telehealth: Payer: Self-pay | Admitting: Cardiology

## 2014-07-21 NOTE — Telephone Encounter (Signed)
New message     Want CPAP card information.  Pt had appt and [facility]  She did not get the reading on the card

## 2014-07-22 NOTE — Telephone Encounter (Signed)
Informed patient of Dr. Theodosia Blender CPAP reading results and recommendations to keep the same settings.

## 2014-08-28 ENCOUNTER — Ambulatory Visit (HOSPITAL_COMMUNITY): Admission: RE | Admit: 2014-08-28 | Payer: 59 | Source: Ambulatory Visit | Admitting: Gastroenterology

## 2014-08-28 ENCOUNTER — Encounter (HOSPITAL_COMMUNITY): Admission: RE | Payer: Self-pay | Source: Ambulatory Visit

## 2014-08-28 SURGERY — COLONOSCOPY WITH PROPOFOL
Anesthesia: Monitor Anesthesia Care

## 2014-10-17 ENCOUNTER — Encounter: Payer: Self-pay | Admitting: Cardiology

## 2014-10-24 ENCOUNTER — Encounter (HOSPITAL_COMMUNITY): Payer: Self-pay | Admitting: *Deleted

## 2014-11-04 ENCOUNTER — Encounter (HOSPITAL_COMMUNITY): Payer: Self-pay | Admitting: *Deleted

## 2014-11-04 ENCOUNTER — Ambulatory Visit (HOSPITAL_COMMUNITY): Payer: Medicare Other | Admitting: Anesthesiology

## 2014-11-04 ENCOUNTER — Ambulatory Visit (HOSPITAL_COMMUNITY)
Admission: RE | Admit: 2014-11-04 | Discharge: 2014-11-04 | Disposition: A | Discharge status: Home / Self Care | Payer: Medicare Other | Source: Ambulatory Visit | Attending: Gastroenterology | Admitting: Gastroenterology

## 2014-11-04 ENCOUNTER — Encounter (HOSPITAL_COMMUNITY)
Admission: RE | Disposition: A | Discharge status: Home / Self Care | Payer: Self-pay | Source: Ambulatory Visit | Attending: Gastroenterology

## 2014-11-04 ENCOUNTER — Other Ambulatory Visit: Payer: Self-pay | Admitting: Gastroenterology

## 2014-11-04 DIAGNOSIS — K219 Gastro-esophageal reflux disease without esophagitis: Secondary | ICD-10-CM | POA: Diagnosis not present

## 2014-11-04 DIAGNOSIS — Z79899 Other long term (current) drug therapy: Secondary | ICD-10-CM | POA: Diagnosis not present

## 2014-11-04 DIAGNOSIS — D124 Benign neoplasm of descending colon: Secondary | ICD-10-CM | POA: Diagnosis not present

## 2014-11-04 DIAGNOSIS — Z791 Long term (current) use of non-steroidal anti-inflammatories (NSAID): Secondary | ICD-10-CM | POA: Insufficient documentation

## 2014-11-04 DIAGNOSIS — E039 Hypothyroidism, unspecified: Secondary | ICD-10-CM | POA: Insufficient documentation

## 2014-11-04 DIAGNOSIS — Z7982 Long term (current) use of aspirin: Secondary | ICD-10-CM | POA: Insufficient documentation

## 2014-11-04 DIAGNOSIS — K5730 Diverticulosis of large intestine without perforation or abscess without bleeding: Secondary | ICD-10-CM | POA: Insufficient documentation

## 2014-11-04 DIAGNOSIS — G4733 Obstructive sleep apnea (adult) (pediatric): Secondary | ICD-10-CM | POA: Insufficient documentation

## 2014-11-04 DIAGNOSIS — Z85828 Personal history of other malignant neoplasm of skin: Secondary | ICD-10-CM | POA: Diagnosis not present

## 2014-11-04 DIAGNOSIS — Z87891 Personal history of nicotine dependence: Secondary | ICD-10-CM | POA: Insufficient documentation

## 2014-11-04 DIAGNOSIS — E785 Hyperlipidemia, unspecified: Secondary | ICD-10-CM | POA: Insufficient documentation

## 2014-11-04 DIAGNOSIS — M1990 Unspecified osteoarthritis, unspecified site: Secondary | ICD-10-CM | POA: Insufficient documentation

## 2014-11-04 DIAGNOSIS — Z09 Encounter for follow-up examination after completed treatment for conditions other than malignant neoplasm: Secondary | ICD-10-CM | POA: Diagnosis present

## 2014-11-04 DIAGNOSIS — F329 Major depressive disorder, single episode, unspecified: Secondary | ICD-10-CM | POA: Insufficient documentation

## 2014-11-04 DIAGNOSIS — G2581 Restless legs syndrome: Secondary | ICD-10-CM | POA: Insufficient documentation

## 2014-11-04 DIAGNOSIS — D123 Benign neoplasm of transverse colon: Secondary | ICD-10-CM | POA: Insufficient documentation

## 2014-11-04 DIAGNOSIS — Z800 Family history of malignant neoplasm of digestive organs: Secondary | ICD-10-CM | POA: Insufficient documentation

## 2014-11-04 DIAGNOSIS — G43909 Migraine, unspecified, not intractable, without status migrainosus: Secondary | ICD-10-CM | POA: Diagnosis not present

## 2014-11-04 DIAGNOSIS — F419 Anxiety disorder, unspecified: Secondary | ICD-10-CM | POA: Insufficient documentation

## 2014-11-04 DIAGNOSIS — Z8674 Personal history of sudden cardiac arrest: Secondary | ICD-10-CM | POA: Diagnosis not present

## 2014-11-04 DIAGNOSIS — Z86010 Personal history of colonic polyps: Secondary | ICD-10-CM | POA: Diagnosis not present

## 2014-11-04 DIAGNOSIS — Z9989 Dependence on other enabling machines and devices: Secondary | ICD-10-CM | POA: Diagnosis not present

## 2014-11-04 HISTORY — DX: Failed or difficult intubation, initial encounter: T88.4XXA

## 2014-11-04 HISTORY — PX: COLONOSCOPY WITH PROPOFOL: SHX5780

## 2014-11-04 HISTORY — DX: Gastro-esophageal reflux disease without esophagitis: K21.9

## 2014-11-04 HISTORY — DX: Other congenital malformations of diaphragm: Q79.1

## 2014-11-04 SURGERY — COLONOSCOPY WITH PROPOFOL
Anesthesia: Monitor Anesthesia Care

## 2014-11-04 MED ORDER — PROPOFOL INFUSION 10 MG/ML OPTIME
INTRAVENOUS | Status: DC | PRN
  Administered 2014-11-04: 80 ug/kg/min via INTRAVENOUS

## 2014-11-04 MED ORDER — SODIUM CHLORIDE 0.9 % IV SOLN: INTRAVENOUS | Status: DC

## 2014-11-04 MED ORDER — PROPOFOL 10 MG/ML IV BOLUS
INTRAVENOUS | Status: AC
  Filled 2014-11-04: qty 20

## 2014-11-04 MED ORDER — LACTATED RINGERS IV SOLN
INTRAVENOUS | Status: DC | PRN
  Administered 2014-11-04: 09:00:00 via INTRAVENOUS

## 2014-11-04 SURGICAL SUPPLY — 22 items

## 2014-11-04 NOTE — Transfer of Care (Signed)
Immediate Anesthesia Transfer of Care Note  Patient: [name]  Procedure(s) Performed: Procedure(s): COLONOSCOPY WITH PROPOFOL (N/A)  Patient Location: PACU  Anesthesia Type:MAC  Level of Consciousness: sedated  Airway & Oxygen Therapy: Patient Spontanous Breathing and Patient connected to nasal cannula oxygen  Post-op Assessment: Report given to RN and Post -op Vital signs reviewed and stable  Post vital signs: Reviewed and stable  Last Vitals:  Filed Vitals:   11/04/14 0925  BP: 139/58  Pulse: 62  Temp:   Resp: 21    Complications: No apparent anesthesia complications

## 2014-11-04 NOTE — Op Note (Signed)
[facility] [place], [address]   COLONOSCOPY PROCEDURE REPORT  PATIENT: [name], [name]  MR#: [id number] BIRTHDATE: October 26, 1950 , 29  yrs. old GENDER: female ENDOSCOPIST: Ronald Lobo, MD REFERRED BY:  Minette Brine PROCEDURE DATE:  11/04/2014 PROCEDURE:   colonoscopy with polypectomy and biopsy ASA CLASS:   III INDICATIONS:history of adenomatous polyps, for surveillance MEDICATIONS: monitored anesthesia care  DESCRIPTION OF PROCEDURE:   After the risks and benefits and of the procedure were explained, informed consent was obtained.        The Pentax Ped Colon [id number]  endoscope was introduced through the anus and advanced to the cecum      .  The quality of the prep was excellent      .  The instrument was then slowly withdrawn as the colon was fully examined.   the patient came as an outpatient to the [facility] long endoscopy unit.        this procedure was done at the hospital under monitored anesthesia care because of a past history of having sustained bradycardia and cardiac arrest requiring CPR during a previous operative procedure, when she was supine (subsequently found to have sleep apnea). She remained stable throughout today's procedure.  The Pentax adult video colonoscope was inserted and advanced quite easily to the area of the colon just above the ileal cecal valve, and then with considerable difficulty into the base of the cecum because of significant looping. The patient has a somewhat obese body habitus but with the help of external abdominal compression, we were able to reach the base of the cecum and pullback was then performed.  The quality of the prep was excellent and is felt that essentially all areas were well seen. An optimal control look at the cecum was probably not obtained but I do think an adequate view was obtained.  On the way in, probably in the sigmoid region, I encountered a small sessile polyp removed  by a single cold biopsy.  Just above the cecum in the proximal descending colon, there were 2 linear polyps, each approximately 3 x 6 mm across. Cold biopsies were used to sample these lesions because respiratory motion made it impossible for me to engage the polyps with a polypectomy snare. It is felt that much of the polyp tissue was excised although it is likely that small amounts of polyp tissue remained following these biopsies.  Near the hepatic flexure I encountered a 4 x 7 mm sessile polyp which was removed by a single cold snare and successfully retrieved.  In the transverse colon, there was a 3 mm sessile polyp removed by a couple of cold biopsies.  There was minimal diverticular change in the sigmoid region, with some small mouth diverticula.  retroflexion in the rectum and reinspection of the rectum were unremarkable.          The scope was then withdrawn from the patient and the procedure completed.  WITHDRAWAL TIME: 22 minutes  COMPLICATIONS: There were no immediate complications.  ENDOSCOPIC IMPRESSION: 1. Several small to medium size polyps removed as described above 2. Early diverticular change in the sigmoid region 3. Technically difficult examination due to looping 4. Remained stable throughout the examination despite previous history of cardiac arrest during an operative procedure  RECOMMENDATIONS: 1. Await pathology on polyps 2. If the tissue is adenomatous in character, anticipate surveillance colonoscopy in 3-5 years  REPEAT EXAM: to be arranged depending on pathology, probably 3-5 years  cc:  _______________________________ eSignedRonald Lobo, MD 11-10-14 10:26 AM   CPT CODES: ICD CODES:  The ICD and CPT codes recommended by this software are interpretations from the data that the clinical staff has captured with the software.  The verification of the translation of this report to the ICD and CPT codes and modifiers is the  sole responsibility of the health care institution and practicing physician where this report was generated.  [employer] will not be held responsible for the validity of the ICD and CPT codes included on this report.  AMA assumes no liability for data contained or not contained herein. CPT is a Designer, television/film set of the [employer].   PATIENT NAME:  [name], [name] MR#: [id number]

## 2014-11-04 NOTE — Anesthesia Postprocedure Evaluation (Signed)
  Anesthesia Post-op Note  Patient: [name]  Procedure(s) Performed: Procedure(s) (LRB): COLONOSCOPY WITH PROPOFOL (N/A)  Patient Location: PACU  Anesthesia Type: MAC  Level of Consciousness: awake and alert   Airway and Oxygen Therapy: Patient Spontanous Breathing  Post-op Pain: mild  Post-op Assessment: Post-op Vital signs reviewed, Patient's Cardiovascular Status Stable, Respiratory Function Stable, Patent Airway and No signs of Nausea or vomiting  Last Vitals:  Filed Vitals:   11/04/14 1042  BP: 102/48  Pulse: 56  Temp:   Resp: 19    Post-op Vital Signs: stable   Complications: No apparent anesthesia complications

## 2014-11-04 NOTE — H&P (Signed)
[name] is an 64 y.o. female.   Chief Complaint: History of colon polyps HPI: The patient is her surveillance colonoscopy, having had colonoscopy in past showing several diminutive adenomatous and there is also a family history of colon cancer in her mother at age 48 and in 2 paternal uncles. The patient's last colonoscopy was about 7 years ago. She does have a history of sleep apnea and also history of bradycardia and transient cardiac arrest, it sounds like, on an operating table when she was in a prone position approximately a year or so ago.  Past Medical History  Diagnosis Date  . Multiple allergies   . Anxiety   . Depression   . Hypothyroidism   . Chronic cough     occasional  . Wears glasses   . Adenomatous polyps   . Cancer 02/2010    basal cell skin cancer  . Hyperlipidemia   . RLS (restless legs syndrome)   . Tachycardia, paroxysmal     sinus tachycardia  . Bradycardia, sinus     with cardiac arrest in setting of respiratory arrest during anesthesia - cardiac workup with echo and nuclear showed no ischemia and normal LVF  . OSA (obstructive sleep apnea)     on CPAP setting of 13  . GERD (gastroesophageal reflux disease)   . Headache(784.0)     migraines occasional  . Arthritis     low back pain  . PONV (postoperative nausea and vomiting)     has to use scop patch, coded during 2012-06-194 surgery on left foot with dr hewitt, sleep apnea found  . Difficult intubation     difficult intubation week  before 03-07-2013 surgery, was not intubated for 03/07/2013 surgery, but laid flat on stomach   . Difficult intubation     and coded and saw dr Tressia Miners turner and was told she had osa and that caused code  . 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 Hemorrhoid surgery  2010    [facility]  . Strabismus surgery      age 47 x3  . Wisdom tooth extraction    . Elbow bursa surgery  2012     lt  . Colonoscopy      x3  . Gastrocnemius recession Left 03-07-2013    Procedure: GASTROCNEMIUS RECESSION;  Surgeon: Wylene Simmer, MD;  Location: [facility];  Service: Orthopedics;  Laterality: Left;  . Abdominal hysterectomy  1988    partial  . Appendectomy      with hyst    Family History  Problem Relation Age of Onset  . Colon polyps Mother   . Parkinson's disease Mother   . CAD Mother   . Arrhythmia Father     afib  . CVA Father   . Cancer - Colon Paternal Uncle   . Cancer - Colon Paternal Uncle    Social History:  reports that she quit smoking about 21 months ago. She has never used smokeless tobacco. She reports that she drinks alcohol. She reports that she does not use illicit drugs.  Allergies:  Allergies  Allergen Reactions  . Dilaudid [Hydromorphone Hcl] Nausea And Vomiting    tachycardia    Medications Prior to Admission  Medication Sig Dispense Refill  . aspirin 325 MG tablet Take 325 mg by mouth every morning.     Marland Kitchen atenolol (TENORMIN) 25 MG tablet Take 25  mg by mouth every morning.     Marland Kitchen azelastine (ASTELIN) 137 MCG/SPRAY nasal spray Place 1 spray into the nose as needed for rhinitis or allergies. Use in each nostril as directed    . b complex vitamins capsule Take 1 capsule by mouth every morning.     . calcium-vitamin D (OSCAL WITH D) 500-200 MG-UNIT per tablet Take 1 tablet by mouth every morning.     . cetirizine-pseudoephedrine (ZYRTEC-D) 5-120 MG per tablet Take 1 tablet by mouth every morning.     Marland Kitchen estradiol (ESTRACE) 1 MG tablet Take 1 mg by mouth every morning.     . fish oil-omega-3 fatty acids 1000 MG capsule Take 2 g by mouth every morning.     . Flaxseed, Linseed, (FLAX SEED OIL) 1000 MG CAPS Take 1,000 mg by mouth at bedtime.     . fluticasone (FLONASE) 50 MCG/ACT nasal spray Place 2 sprays into the nose as needed for allergies or rhinitis.     Marland Kitchen ibuprofen (ADVIL,MOTRIN) 200 MG tablet Take 600 mg by mouth every 6 (six) hours as  needed for headache or moderate pain.    Marland Kitchen levothyroxine (SYNTHROID, LEVOTHROID) 75 MCG tablet Take 75 mcg by mouth daily before breakfast.    . LORazepam (ATIVAN) 2 MG tablet Take 2 mg by mouth 2 (two) times daily as needed for anxiety.    . montelukast (SINGULAIR) 10 MG tablet Take 10 mg by mouth at bedtime.    . Multiple Vitamins-Minerals (MULTIVITAMIN WITH MINERALS) tablet Take 1 tablet by mouth every morning.     Marland Kitchen rOPINIRole (REQUIP) 0.5 MG tablet Take 0.5 mg by mouth at bedtime.    . sertraline (ZOLOFT) 100 MG tablet Take 100 mg by mouth every morning.     Marland Kitchen tiZANidine (ZANAFLEX) 4 MG tablet Take 4 mg by mouth 2 (two) times daily as needed for muscle spasms.    Marland Kitchen zolmitriptan (ZOMIG) 5 MG tablet Take 5 mg by mouth as needed for migraine.    Marland Kitchen albuterol (PROVENTIL HFA;VENTOLIN HFA) 108 (90 BASE) MCG/ACT inhaler Inhale 2 puffs into the lungs every 6 (six) hours as needed for wheezing.    . docusate sodium (COLACE) 100 MG capsule Take 100 mg by mouth daily as needed for mild constipation.      No results found for this or any previous visit (from the past 48 hour(s)). No results found.  ROS negative for active GI symptoms  Blood pressure 139/58, pulse 62, temperature 98.1 F (36.7 C), temperature source Oral, resp. rate 21, height 5' (1.524 m), weight 74.844 kg (165 lb), SpO2 96 %. Physical Exam pleasant, somewhat overweight Caucasian female, not anxious or depressed. Anicteric, no pallor. Chest is clear. Heart is without murmur or arrhythmia. Abdomen is without guarding, mass effect, or tenderness. Neurologically grossly intact.  Assessment/Plan History of colon polyps, for colonoscopy today with monitored anesthesia care. The anesthesia people are aware of her past anesthesia problems.  [place] V 11/04/2014, 9:41 AM

## 2014-11-04 NOTE — Anesthesia Preprocedure Evaluation (Addendum)
Anesthesia Evaluation  Patient identified by MRN, date of birth, ID band Patient awake and Patient unresponsive    Reviewed: Allergy & Precautions, H&P , NPO status , Patient's Chart, lab work & pertinent test results, reviewed documented beta blocker date and time   History of Anesthesia Complications (+) PONV and DIFFICULT AIRWAY  Airway Mallampati: II  TM Distance: >3 FB Neck ROM: Full    Dental  (+) Caps, Dental Advisory Given All front upper teeth are capped:   Pulmonary sleep apnea , former smoker,  breath sounds clear to auscultation  Pulmonary exam normal       Cardiovascular Exercise Tolerance: Good negative cardio ROS  Rhythm:regular Rate:Normal  Bradycardia with sinus arrest.  Cardiac work up normal. History tachycardia too.   Neuro/Psych  Headaches, PSYCHIATRIC DISORDERS Anxiety Depression negative neurological ROS  negative psych ROS   GI/Hepatic negative GI ROS, Neg liver ROS,   Endo/Other  negative endocrine ROSHypothyroidism   Renal/GU negative Renal ROS  negative genitourinary   Musculoskeletal   Abdominal   Peds  Hematology negative hematology ROS (+)   Anesthesia Other Findings   Reproductive/Obstetrics negative OB ROS                            Anesthesia Physical Anesthesia Plan  ASA: III  Anesthesia Plan: MAC   Post-op Pain Management:    Induction:   Airway Management Planned:   Additional Equipment:   Intra-op Plan:   Post-operative Plan:   Informed Consent: I have reviewed the patients History and Physical, chart, labs and discussed the procedure including the risks, benefits and alternatives for the proposed anesthesia with the patient or authorized representative who has indicated his/her understanding and acceptance.   Dental Advisory Given  Plan Discussed with: CRNA and Surgeon  Anesthesia Plan Comments:         Anesthesia Quick  Evaluation

## 2014-11-05 ENCOUNTER — Encounter (HOSPITAL_COMMUNITY): Payer: Self-pay | Admitting: Gastroenterology

## 2014-12-25 ENCOUNTER — Ambulatory Visit
Admission: RE | Admit: 2014-12-25 | Discharge: 2014-12-25 | Disposition: A | Discharge status: Home / Self Care | Payer: Medicare Other | Source: Ambulatory Visit | Attending: Family Medicine | Admitting: Family Medicine

## 2014-12-25 ENCOUNTER — Other Ambulatory Visit: Payer: Self-pay | Admitting: Family Medicine

## 2014-12-25 DIAGNOSIS — M5489 Other dorsalgia: Secondary | ICD-10-CM

## 2014-12-29 ENCOUNTER — Other Ambulatory Visit: Payer: Self-pay | Admitting: Obstetrics and Gynecology

## 2014-12-30 LAB — CYTOLOGY - PAP

## 2015-01-13 ENCOUNTER — Ambulatory Visit (INDEPENDENT_AMBULATORY_CARE_PROVIDER_SITE_OTHER): Payer: Medicare Other | Admitting: Cardiology

## 2015-01-13 ENCOUNTER — Encounter: Payer: Self-pay | Admitting: Cardiology

## 2015-01-13 VITALS — BP 98/64 | HR 68 | Ht 59.75 in | Wt 167.1 lb

## 2015-01-13 DIAGNOSIS — E669 Obesity, unspecified: Secondary | ICD-10-CM

## 2015-01-13 DIAGNOSIS — G4733 Obstructive sleep apnea (adult) (pediatric): Secondary | ICD-10-CM | POA: Diagnosis not present

## 2015-01-13 DIAGNOSIS — G2581 Restless legs syndrome: Secondary | ICD-10-CM | POA: Diagnosis not present

## 2015-01-13 IMAGING — CR DG CHEST 2V
2 series · 2 of 2 positions shown · non-contrast
Comparison: Chest radiograph 02/21/2013.

CLINICAL DATA: Cough.  Shortness of breath.

EXAM:
CHEST  2 VIEW

[view not recorded (1 of 2)]
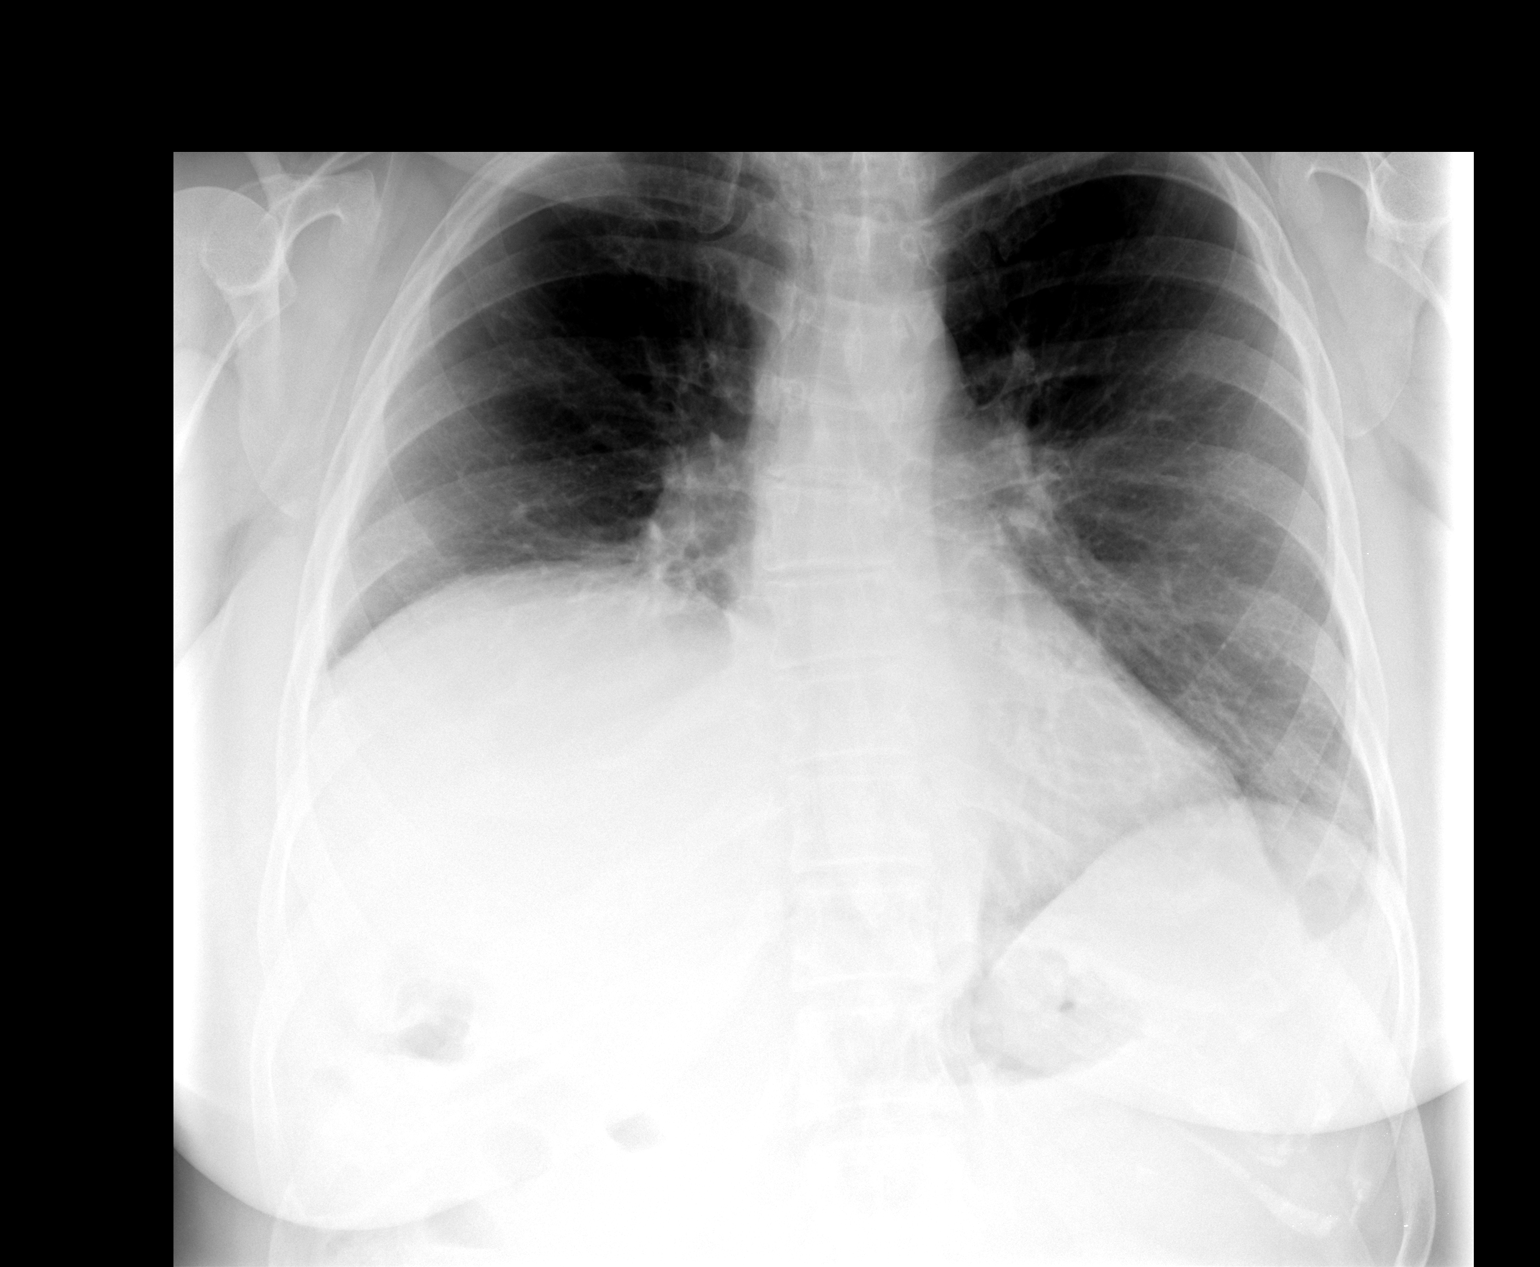

[view not recorded (2 of 2)]
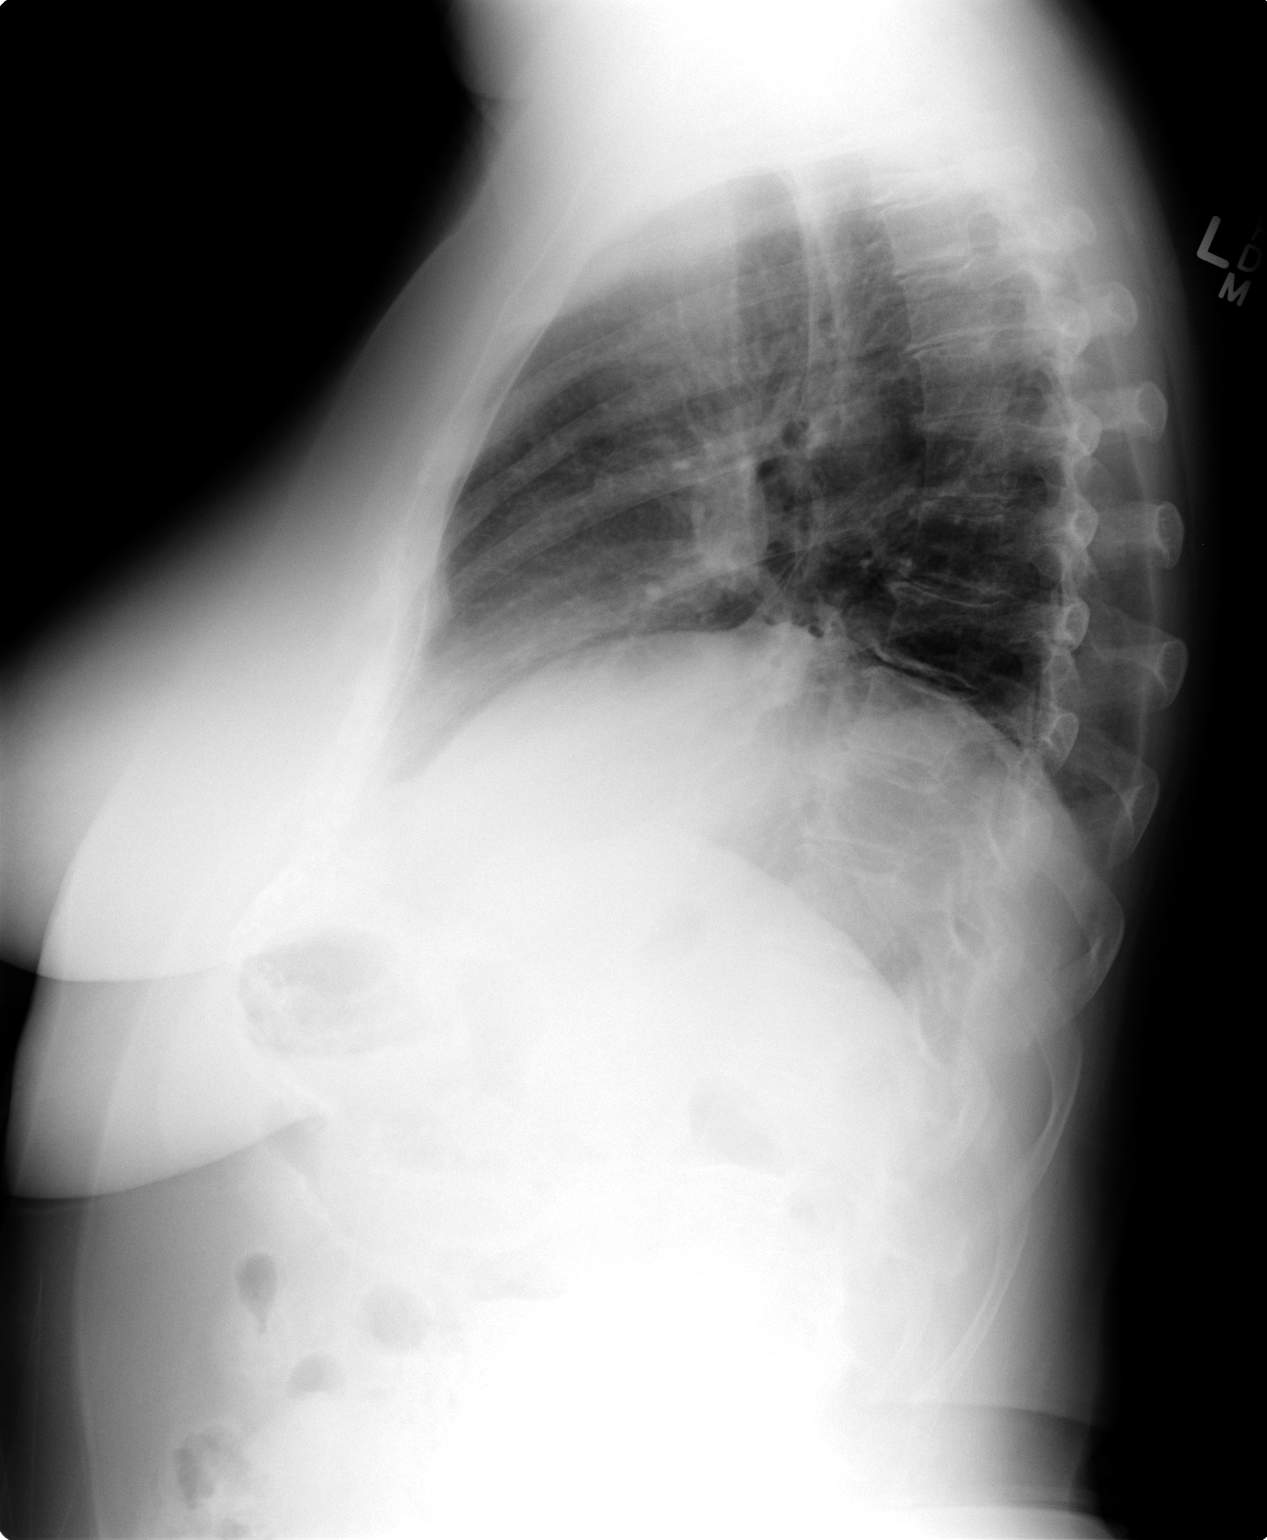

[2 of 2 positions shown; findings below may reference images not displayed]

FINDINGS: Stable cardiac and mediastinal contours. Persistent marked elevation
right hemidiaphragm minimal linear opacities right lung base. No
large consolidative pulmonary opacities. No pleural effusion or
pneumothorax.
IMPRESSION: Elevation right hemidiaphragm. Minimal right basilar opacities
likely represent atelectasis.

## 2015-01-13 NOTE — Patient Instructions (Signed)
Medication Instructions:  Your physician recommends that you continue on your current medications as directed. Please refer to the Current Medication list given to you today.   Labwork: NONE  Testing/Procedures: NONE  Follow-Up: Your physician wants you to follow-up in: [place]. You will receive a reminder letter in the mail two months in advance. If you don't receive a letter, please call our office to schedule the follow-up appointment.   Any Other Special Instructions Will Be Listed Below (If Applicable).

## 2015-01-13 NOTE — Progress Notes (Signed)
Cardiology Office Note   Date:  01/13/2015   ID:  [name], DOB 09-Oct-1950, MRN [id number]  PCP:  Kelly Frees, MD    Chief Complaint  Patient presents with  . Sleep Apnea  . Obesity      History of Present Illness: [name] is a 64 y.o. female with a history of OSA, obesity and RLS who presents today for followup. She is doing well. She tolerates her CPAP well. She tolerates her nasal mask and feels the pressure is adequate. She denies any nasal congestion or mouth dryness.  She does not snore when using it.  She feels rested in the am.  She has some daytime sleepiness if her back keeps her up at night. Her download today showed an AHI of 1/hr on 13cm H2O and 99% compliance in using more than 4 hours nightly. She says that she occasionally has some problems with restless legs which seems to have gotten worse.    Past Medical History  Diagnosis Date  . Multiple allergies   . Anxiety   . Depression   . Hypothyroidism   . Chronic cough     occasional  . Wears glasses   . Adenomatous polyps   . Cancer 02/2010    basal cell skin cancer  . Hyperlipidemia   . RLS (restless legs syndrome)   . Tachycardia, paroxysmal     sinus tachycardia  . Bradycardia, sinus     with cardiac arrest in setting of respiratory arrest during anesthesia - cardiac workup with echo and nuclear showed no ischemia and normal LVF  . OSA (obstructive sleep apnea)     on CPAP setting of 13  . GERD (gastroesophageal reflux disease)   . Headache(784.0)     migraines occasional  . Arthritis     low back pain  . PONV (postoperative nausea and vomiting)     has to use scop patch, coded during 2012/06/294 surgery on left foot with dr hewitt, sleep apnea found  . Difficult intubation     difficult intubation week  before March 17, 2013 surgery, was not intubated for 2013-03-17 surgery, but laid flat on stomach   . Difficult intubation     and coded and saw dr Tressia Miners turner and was told she had osa and  that caused code  . 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 Hemorrhoid surgery  2010    [facility]  . Strabismus surgery      age 59 x3  . Wisdom tooth extraction    . Elbow bursa surgery  2012    lt  . Colonoscopy      x3  . Gastrocnemius recession Left Mar 17, 2013    Procedure: GASTROCNEMIUS RECESSION;  Surgeon: Wylene Simmer, MD;  Location: [facility];  Service: Orthopedics;  Laterality: Left;  . Abdominal hysterectomy  1988    partial  . Appendectomy      with hyst  . Colonoscopy with propofol N/A 11/04/2014    Procedure: COLONOSCOPY WITH PROPOFOL;  Surgeon: Cleotis Nipper, MD;  Location: [facility];  Service: Endoscopy;  Laterality: N/A;     Current Outpatient Prescriptions  Medication Sig Dispense Refill  . albuterol (PROVENTIL HFA;VENTOLIN HFA) 108 (90 BASE) MCG/ACT inhaler Inhale 2 puffs into the lungs every 6 (six) hours as needed for wheezing.    Marland Kitchen aspirin 325 MG tablet Take 325 mg  by mouth every morning.     Marland Kitchen atenolol (TENORMIN) 25 MG tablet Take 25 mg by mouth every morning.     Marland Kitchen azelastine (ASTELIN) 137 MCG/SPRAY nasal spray Place 1 spray into the nose as needed for rhinitis or allergies. Use in each nostril as directed    . cetirizine-pseudoephedrine (ZYRTEC-D) 5-120 MG per tablet Take 1 tablet by mouth every morning.     . Cholecalciferol 4000 UNITS CAPS Take 4,000 Units by mouth daily.    Marland Kitchen docusate sodium (COLACE) 100 MG capsule Take 100 mg by mouth daily as needed for mild constipation.    Marland Kitchen estradiol (ESTRACE) 1 MG tablet Take 1 mg by mouth every morning.     . fish oil-omega-3 fatty acids 1000 MG capsule Take 2 g by mouth every morning.     . fluticasone (FLONASE) 50 MCG/ACT nasal spray Place 2 sprays into the nose as needed for allergies or rhinitis.     Marland Kitchen ibuprofen (ADVIL,MOTRIN) 200 MG tablet Take 600 mg by mouth every 6 (six) hours as needed for  headache or moderate pain.    Marland Kitchen levothyroxine (SYNTHROID, LEVOTHROID) 75 MCG tablet Take 75 mcg by mouth daily before breakfast.    . LORazepam (ATIVAN) 2 MG tablet Take 2 mg by mouth 2 (two) times daily as needed for anxiety.    . montelukast (SINGULAIR) 10 MG tablet Take 10 mg by mouth at bedtime.    . Multiple Vitamins-Minerals (MULTIVITAMIN WITH MINERALS) tablet Take 1 tablet by mouth every morning.     Marland Kitchen rOPINIRole (REQUIP) 0.5 MG tablet Take 0.5 mg by mouth at bedtime.    . sertraline (ZOLOFT) 100 MG tablet Take 100 mg by mouth every morning.     Marland Kitchen tiZANidine (ZANAFLEX) 4 MG tablet Take 4 mg by mouth 2 (two) times daily as needed for muscle spasms.    . traMADol (ULTRAM) 50 MG tablet Take 50 mg by mouth as needed. For pain    . zolmitriptan (ZOMIG) 5 MG tablet Take 5 mg by mouth as needed for migraine.     No current facility-administered medications for this visit.    Allergies:   Dilaudid    Social History:  The patient  reports that she quit smoking about 1 years ago. She has never used smokeless tobacco. She reports that she drinks alcohol. She reports that she does not use illicit drugs.   Family History:  The patient's family history includes Arrhythmia in her father; CAD in her mother; CVA in her father; Cancer - Colon in her paternal uncle and paternal uncle; Colon polyps in her mother; Parkinson's disease in her mother.    ROS:  Please see the history of present illness.   Otherwise, review of systems are positive for hip and back pain.   All other systems are reviewed and negative.    PHYSICAL EXAM: VS:  BP 98/64 mmHg  Pulse 68  Ht 4' 11.75" (1.518 m)  Wt 167 lb 1.9 oz (75.805 kg)  BMI 32.90 kg/m2  SpO2 95% , BMI Body mass index is 32.9 kg/(m^2). GEN: Well nourished, well developed, in no acute distress HEENT: normal Neck: no JVD, carotid bruits, or masses Cardiac: RRR; no murmurs, rubs, or gallops,no edema  Respiratory:  clear to auscultation bilaterally, normal  work of breathing GI: soft, nontender, nondistended, + BS MS: no deformity or atrophy Skin: warm and dry, no rash Neuro:  Strength and sensation are intact Psych: euthymic mood, full affect   EKG:  EKG is not ordered today.    Recent Labs: No results found for requested labs within last 365 days.    Lipid Panel No results found for: CHOL, TRIG, HDL, CHOLHDL, VLDL, LDLCALC, LDLDIRECT    Wt Readings from Last 3 Encounters:  01/13/15 167 lb 1.9 oz (75.805 kg)  11/04/14 165 lb (74.844 kg)  07/07/14 169 lb (76.658 kg)     ASSESSMENT AND PLAN:  1. OSA on CPAP - she will continue on her current CPAP settings 2. Restless legs syndrome - fairly well controlled on Requip but is having issues during the day.  Her PCP is managing her meds for this.   - continue Requip  3. Obesity - I have encouraged her to get into a routine exercise program but she is limited currently due to foot and ankle pain. I have encouraged her to use her stationary bike for exercise.  She is walking some. I encouraged her to follow a low fat and low carb diet and watch her portions.    Current medicines are reviewed at length with the patient today.  The patient does not have concerns regarding medicines.  The following changes have been made:  no change  Labs/ tests ordered today include: see above assessment and plan No orders of the defined types were placed in this encounter.     Disposition:   FU with me in 6 months   Signed, Sueanne Margarita, MD  01/13/2015 10:55 AM    [facility] [address], [place], [place]  [address] Phone: [phone]; Fax: [phone]

## 2015-01-15 IMAGING — RF DG SNIFF TEST
1 series · 1 of 1 positions shown · non-contrast
Comparison: Chest radiograph 10/15/2013

FLUOROSCOPY TIME:  1 min 38 seconds

CLINICAL DATA: Elevated diaphragm.

EXAM:
SNIFF TEST
TECHNIQUE: Realtime fluoroscopy was used to observed diaphragmatic movement in
the AP and lateral projections during quiet breathing, deep
breathing, and forceful mouth closed inspiration (sniffing).

[Series 1: run · 1 of 1 slices shown]
[im 1/1]
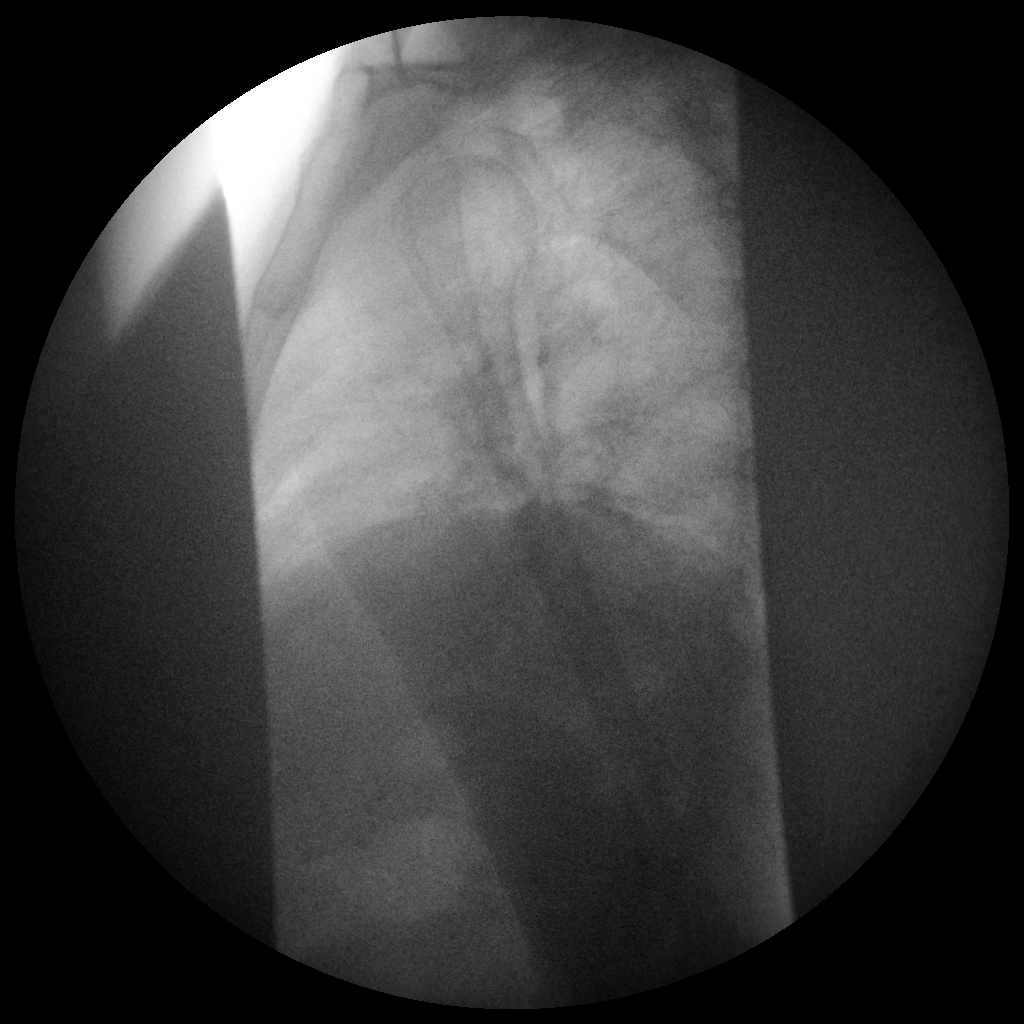

[1 of 1 positions shown; findings below may reference images not displayed]

FINDINGS: As seen on multiple prior chest radiographs, there is elevation of
the entire right hemidiaphragm.

With quiet and deep breathing, both hemidiaphragms moved downward
upon inspiration, however right diaphragmatic excursion was
diminished compared to the left. With sniffing, normal left
diaphragmatic movement was observed, however there was mild
paradoxical movement of the right hemidiaphragm.
IMPRESSION: Right diaphragm weakness without paralysis.

## 2015-02-12 ENCOUNTER — Encounter: Payer: Self-pay | Admitting: Cardiology

## 2015-03-16 ENCOUNTER — Other Ambulatory Visit: Payer: Self-pay

## 2015-04-23 ENCOUNTER — Encounter: Payer: Self-pay | Admitting: Cardiology

## 2015-06-17 ENCOUNTER — Telehealth: Payer: Self-pay | Admitting: Cardiology

## 2015-06-17 NOTE — Telephone Encounter (Signed)
Attempted to call patient. After several rings, unable to leave message. No VM set up.  Will try again later.

## 2015-06-17 NOTE — Telephone Encounter (Signed)
New message       Did we get the order for pt to try nasal mask for her cpap machine?  [facility] said [facility] sent it days ago.

## 2015-06-23 NOTE — Telephone Encounter (Signed)
Spoke with patient to let her know that I would contact [facility] and have them send their note again.   She appreciated the call.  I have left a message with Jonni Sanger to make sure I send what is needed for her to get her supplies.

## 2015-06-23 NOTE — Telephone Encounter (Signed)
Left message on home phone to call me back.   Called cell number - gentleman said he would try to remember to have her call me back.

## 2015-07-12 NOTE — Progress Notes (Signed)
Cardiology Office Note   Date:  07/13/2015   ID:  [name], DOB 01-18-1951, MRN [id number]  PCP:  Shirline Frees, MD    Chief Complaint  Patient presents with  . Sleep Apnea      History of Present Illness: [name] is a 64 y.o. female with a history of OSA, obesity and RLS who presents today for followup. She is doing well. She tolerates her CPAP well. She tolerates her nasal mask and feels the pressure is adequate. She is going to try a nasal pillow mask.  She denies any nasal congestion or mouth dryness. She does not snore when using it. She feels rested in the am. She has some daytime sleepiness. Her download today showed an AHI of 1.1/hr on 13cm H2O and 97% compliance in using more than 4 hours nightly.    Past Medical History  Diagnosis Date  . Multiple allergies   . Anxiety   . Depression   . Hypothyroidism   . Chronic cough     occasional  . Wears glasses   . Adenomatous polyps   . Cancer ([facility]) 02/2010    basal cell skin cancer  . Hyperlipidemia   . RLS (restless legs syndrome)   . Tachycardia, paroxysmal ([facility])     sinus tachycardia  . Bradycardia, sinus     with cardiac arrest in setting of respiratory arrest during anesthesia - cardiac workup with echo and nuclear showed no ischemia and normal LVF  . OSA (obstructive sleep apnea)     on CPAP setting of 13  . GERD (gastroesophageal reflux disease)   . Headache(784.0)     migraines occasional  . Arthritis     low back pain  . PONV (postoperative nausea and vomiting)     has to use scop patch, coded during 06-21-124 surgery on left foot with dr hewitt, sleep apnea found  . Difficult intubation     difficult intubation week  before 09-Mar-2013 surgery, was not intubated for March 09, 2013 surgery, but laid flat on stomach   . Difficult intubation     and coded and saw dr Tressia Miners Kalani Baray and was told she had osa and that caused code  . 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 Hemorrhoid surgery  2010    [facility]  . Strabismus surgery      age 15 x3  . Wisdom tooth extraction    . Elbow bursa surgery  2012    lt  . Colonoscopy      x3  . Gastrocnemius recession Left 2013/03/09    Procedure: GASTROCNEMIUS RECESSION;  Surgeon: Wylene Simmer, MD;  Location: [facility];  Service: Orthopedics;  Laterality: Left;  . Abdominal hysterectomy  1988    partial  . Appendectomy      with hyst  . Colonoscopy with propofol N/A 11/04/2014    Procedure: COLONOSCOPY WITH PROPOFOL;  Surgeon: Cleotis Nipper, MD;  [facility];  Service: Endoscopy;  Laterality: N/A;     Current Outpatient Prescriptions  Medication Sig Dispense Refill  . albuterol (PROVENTIL HFA;VENTOLIN HFA) 108 (90 BASE) MCG/ACT inhaler Inhale 2 puffs into the lungs every 6 (six) hours as needed for wheezing.    Marland Kitchen aspirin 325 MG tablet Take 325 mg by mouth every morning.     Marland Kitchen  atenolol (TENORMIN) 25 MG tablet Take 25 mg by mouth every morning.     Marland Kitchen azelastine (ASTELIN) 137 MCG/SPRAY nasal spray Place 1 spray into the nose as needed for rhinitis or allergies. Use in each nostril as directed    . cetirizine-pseudoephedrine (ZYRTEC-D) 5-120 MG per tablet Take 1 tablet by mouth every morning.     . Cholecalciferol 4000 UNITS CAPS Take 4,000 Units by mouth daily.    Marland Kitchen docusate sodium (COLACE) 100 MG capsule Take 100 mg by mouth daily as needed for mild constipation.    Marland Kitchen estradiol (ESTRACE) 1 MG tablet Take 1 mg by mouth every morning.     . fish oil-omega-3 fatty acids 1000 MG capsule Take 2 g by mouth every morning.     . fluticasone (FLONASE) 50 MCG/ACT nasal spray Place 2 sprays into the nose as needed for allergies or rhinitis.     Marland Kitchen ibuprofen (ADVIL,MOTRIN) 200 MG tablet Take 600 mg by mouth every 6 (six) hours as needed for headache or moderate pain.    Marland Kitchen levothyroxine (SYNTHROID, LEVOTHROID)  75 MCG tablet Take 75 mcg by mouth daily before breakfast.    . LORazepam (ATIVAN) 2 MG tablet Take 2 mg by mouth 2 (two) times daily as needed for anxiety.    . montelukast (SINGULAIR) 10 MG tablet Take 10 mg by mouth at bedtime.    . Multiple Vitamins-Minerals (MULTIVITAMIN WITH MINERALS) tablet Take 1 tablet by mouth every morning.     Marland Kitchen rOPINIRole (REQUIP) 0.5 MG tablet Take 0.5 mg by mouth at bedtime.    . sertraline (ZOLOFT) 100 MG tablet Take 100 mg by mouth every morning.     . traMADol (ULTRAM) 50 MG tablet Take 50 mg by mouth as needed. For pain    . zolmitriptan (ZOMIG) 5 MG tablet Take 5 mg by mouth as needed for migraine.     No current facility-administered medications for this visit.    Allergies:   Dilaudid    Social History:  The patient  reports that she quit smoking about 2 years ago. She has never used smokeless tobacco. She reports that she drinks alcohol. She reports that she does not use illicit drugs.   Family History:  The patient's family history includes Arrhythmia in her father; CAD in her mother; CVA in her father; Cancer - Colon in her paternal uncle and paternal uncle; Colon polyps in her mother; Parkinson's disease in her mother.    ROS:  Please see the history of present illness.   Otherwise, review of systems are positive for none.   All other systems are reviewed and negative.    PHYSICAL EXAM: VS:  BP 128/70 mmHg  Pulse 63  Ht 4' 11.5" (1.511 m)  Wt 159 lb (72.122 kg)  BMI 31.59 kg/m2  SpO2 94% , BMI Body mass index is 31.59 kg/(m^2). GEN: Well nourished, well developed, in no acute distress HEENT: normal Neck: no JVD, carotid bruits, or masses Cardiac: RRR; no murmurs, rubs, or gallops,no edema  Respiratory:  clear to auscultation bilaterally, normal work of breathing GI: soft, nontender, nondistended, + BS MS: no deformity or atrophy Skin: warm and dry, no rash Neuro:  Strength and sensation are intact Psych: euthymic mood, full  affect   EKG:  EKG is not ordered today.    Recent Labs: No results found for requested labs within last 365 days.    Lipid Panel No results found for: CHOL, TRIG, HDL, CHOLHDL, VLDL, LDLCALC, LDLDIRECT  Wt Readings from Last 3 Encounters:  07/13/15 159 lb (72.122 kg)  01/13/15 167 lb 1.9 oz (75.805 kg)  11/04/14 165 lb (74.844 kg)    ASSESSMENT AND PLAN:  1. OSA on CPAP - she will continue on her current CPAP settings 2. Restless legs syndrome - fairly well controlled on Requip. Her PCP is managing her meds for this. - continue Requip  3. Obesity - I have encouraged her to get into a routine exercise program but she is limited currently due to foot and ankle pain. She is walking more. I encouraged her to follow a low fat and low carb diet and watch her portions.   Current medicines are reviewed at length with the patient today.  The patient does not have concerns regarding medicines.  The following changes have been made:  no change  Labs/ tests ordered today: See above Assessment and Plan No orders of the defined types were placed in this encounter.     Disposition:   FU with me in 1 year  Signed, Sueanne Margarita, MD  07/13/2015 8:47 AM    [facility] [address], [place], [place]  [address] Phone: [phone]; Fax: [phone]

## 2015-07-13 ENCOUNTER — Encounter: Payer: Self-pay | Admitting: Cardiology

## 2015-07-13 ENCOUNTER — Ambulatory Visit (INDEPENDENT_AMBULATORY_CARE_PROVIDER_SITE_OTHER): Payer: Medicare Other | Admitting: Cardiology

## 2015-07-13 VITALS — BP 128/70 | HR 63 | Ht 59.5 in | Wt 159.0 lb

## 2015-07-13 DIAGNOSIS — G4733 Obstructive sleep apnea (adult) (pediatric): Secondary | ICD-10-CM

## 2015-07-13 DIAGNOSIS — G2581 Restless legs syndrome: Secondary | ICD-10-CM

## 2015-07-13 DIAGNOSIS — E669 Obesity, unspecified: Secondary | ICD-10-CM | POA: Diagnosis not present

## 2015-07-13 NOTE — Patient Instructions (Signed)

## 2016-07-07 ENCOUNTER — Encounter: Payer: Self-pay | Admitting: Cardiology

## 2016-07-15 ENCOUNTER — Encounter: Payer: Self-pay | Admitting: Cardiology

## 2016-07-15 ENCOUNTER — Ambulatory Visit (INDEPENDENT_AMBULATORY_CARE_PROVIDER_SITE_OTHER): Payer: Medicare Other | Admitting: Cardiology

## 2016-07-15 VITALS — BP 132/60 | HR 70 | Ht 59.5 in | Wt 170.8 lb

## 2016-07-15 DIAGNOSIS — G2581 Restless legs syndrome: Secondary | ICD-10-CM

## 2016-07-15 DIAGNOSIS — G4733 Obstructive sleep apnea (adult) (pediatric): Secondary | ICD-10-CM

## 2016-07-15 DIAGNOSIS — E669 Obesity, unspecified: Secondary | ICD-10-CM | POA: Diagnosis not present

## 2016-07-15 NOTE — Patient Instructions (Signed)

## 2016-07-15 NOTE — Progress Notes (Signed)
Cardiology Office Note    Date:  07/15/2016   ID:  [name], DOB 12/29/1950, MRN [id number]  PCP:  Shirline Frees, MD  Cardiologist:  Fransico Him, MD   Chief Complaint  Patient presents with  . Sleep Apnea    History of Present Illness:  [name] is a 65 y.o. female  with a history of OSA, obesity and RLS who presents today for followup. She is doing well. She tolerates her CPAP well. She tolerates her nasal mask and feels the pressure is adequate. She is going to try a nasal pillow mask.  She denies any significant nasal congestion  But occasionally has some mouth dryness. She sleeps well with it. She does not snore when using it. She feels rested in the am. She has some daytime sleepiness. She still has problems with RLS but is much improved with Requip.    Past Medical History:  Diagnosis Date  . Adenomatous polyps   . Anxiety   . Arthritis    low back pain  . Bradycardia, sinus    with cardiac arrest in setting of respiratory arrest during anesthesia - cardiac workup with echo and nuclear showed no ischemia and normal LVF  . Cancer ([place]) 02/2010   basal cell skin cancer  . Chronic cough    occasional  . Depression   . 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 Difficult intubation    difficult intubation week  before 03/01/13 surgery, was not intubated for 03/01/13 surgery, but laid flat on stomach   . Difficult intubation    and coded and saw dr Tressia Miners Presley Gora and was told she had osa and that caused code  . GERD (gastroesophageal reflux disease)   . Headache(784.0)    migraines occasional  . Hyperlipidemia   . Hypothyroidism   . Multiple allergies   . OSA (obstructive sleep apnea)    on CPAP setting of 13  . PONV (postoperative nausea and vomiting)    has to use scop patch, coded during 06/13/20124 surgery on left foot with dr hewitt, sleep apnea found  . RLS (restless legs syndrome)   . Tachycardia,  paroxysmal (HCC)    sinus tachycardia  . Wears glasses     Past Surgical History:  Procedure Laterality Date  . ABDOMINAL HYSTERECTOMY  1988   partial  . APPENDECTOMY     with hyst  . COLONOSCOPY     x3  . COLONOSCOPY WITH PROPOFOL N/A 11/04/2014   Procedure: COLONOSCOPY WITH PROPOFOL;  Surgeon: Cleotis Nipper, MD;  Location: [facility];  Service: Endoscopy;  Laterality: N/A;  . ELBOW BURSA SURGERY  2012   lt  . GASTROCNEMIUS RECESSION Left 03-01-13   Procedure: GASTROCNEMIUS RECESSION;  Surgeon: Wylene Simmer, MD;  Location: [facility];  Service: Orthopedics;  Laterality: Left;  . [place]  2010   [facility]  . STRABISMUS SURGERY     age 44 x3  . WISDOM TOOTH EXTRACTION      Current Medications: Outpatient Medications Prior to Visit  Medication Sig Dispense Refill  . albuterol (PROVENTIL HFA;VENTOLIN HFA) 108 (90 BASE) MCG/ACT inhaler Inhale 2 puffs into the lungs every 6 (six) hours as needed for wheezing.    Marland Kitchen aspirin 325 MG tablet Take 325 mg by mouth every morning.     Marland Kitchen atenolol (TENORMIN) 25 MG tablet Take 25 mg by mouth every morning.     Marland Kitchen azelastine (ASTELIN) 137  MCG/SPRAY nasal spray Place 1 spray into the nose as needed for rhinitis or allergies. Use in each nostril as directed    . cetirizine-pseudoephedrine (ZYRTEC-D) 5-120 MG per tablet Take 1 tablet by mouth every morning.     . Cholecalciferol 4000 UNITS CAPS Take 4,000 Units by mouth daily.    Marland Kitchen docusate sodium (COLACE) 100 MG capsule Take 100 mg by mouth daily as needed for mild constipation.    Marland Kitchen estradiol (ESTRACE) 1 MG tablet Take 1 mg by mouth every morning.     . fish oil-omega-3 fatty acids 1000 MG capsule Take 2 g by mouth every morning.     . fluticasone (FLONASE) 50 MCG/ACT nasal spray Place 2 sprays into the nose as needed for allergies or rhinitis.     Marland Kitchen ibuprofen (ADVIL,MOTRIN) 200 MG tablet Take 600 mg by mouth every 6 (six) hours as needed for headache or moderate pain.    Marland Kitchen  levothyroxine (SYNTHROID, LEVOTHROID) 75 MCG tablet Take 112 mcg by mouth daily before breakfast.     . LORazepam (ATIVAN) 2 MG tablet Take 2 mg by mouth 2 (two) times daily as needed for anxiety.    . montelukast (SINGULAIR) 10 MG tablet Take 10 mg by mouth at bedtime.    . Multiple Vitamins-Minerals (MULTIVITAMIN WITH MINERALS) tablet Take 1 tablet by mouth every morning.     Marland Kitchen rOPINIRole (REQUIP) 0.5 MG tablet Take 0.5 mg by mouth at bedtime.    . sertraline (ZOLOFT) 100 MG tablet Take 100 mg by mouth every morning.     . traMADol (ULTRAM) 50 MG tablet Take 50 mg by mouth as needed. For pain    . zolmitriptan (ZOMIG) 5 MG tablet Take 5 mg by mouth as needed for migraine.     No facility-administered medications prior to visit.      Allergies:   Dilaudid [hydromorphone hcl]   Social History   Social History  . Marital status: Married    Spouse name: N/A  . Number of children: N/A  . Years of education: N/A   Occupational History  . retired    Social History Main Topics  . Smoking status: Current Some Day Smoker    Packs/day: 0.50    Years: 20.00    Last attempt to quit: 01/21/2013  . Smokeless tobacco: Never Used  . Alcohol use Yes     Comment: rare  . Drug use: No  . Sexual activity: Not Asked     Comment: trying to cut down   Other Topics Concern  . None   Social History Narrative  . None     Family History:  The patient's family history includes Arrhythmia in her father; CAD in her mother; CVA in her father; Cancer - Colon in her paternal uncle and paternal uncle; Colon polyps in her mother; Parkinson's disease in her mother.   ROS:   Please see the history of present illness.    ROS All other systems reviewed and are negative.  No flowsheet data found.     PHYSICAL EXAM:   VS:  BP 132/60   Pulse 70   Ht 4' 11.5" (1.511 m)   Wt 170 lb 12.8 oz (77.5 kg)   SpO2 95%   BMI 33.92 kg/m    GEN: Well nourished, well developed, in no acute distress  HEENT:  normal  Neck: no JVD, carotid bruits, or masses Cardiac: RRR; no murmurs, rubs, or gallops,no edema.  Intact distal pulses bilaterally.  Respiratory:  clear to auscultation bilaterally, normal work of breathing GI: soft, nontender, nondistended, + BS MS: no deformity or atrophy  Skin: warm and dry, no rash Neuro:  Alert and Oriented x 3, Strength and sensation are intact Psych: euthymic mood, full affect  Wt Readings from Last 3 Encounters:  07/15/16 170 lb 12.8 oz (77.5 kg)  07/13/15 159 lb (72.1 kg)  01/13/15 167 lb 1.9 oz (75.8 kg)      Studies/Labs Reviewed:   EKG:  EKG is not ordered today.    Recent Labs: No results found for requested labs within last 8760 hours.   Lipid Panel No results found for: CHOL, TRIG, HDL, CHOLHDL, VLDL, LDLCALC, LDLDIRECT  Additional studies/ records that were reviewed today include:  CPAP download    ASSESSMENT:    1. OSA (obstructive sleep apnea)   2. Obesity (BMI 30-39.9)   3. RLS (restless legs syndrome)      PLAN:  In order of problems listed above:  OSA - the patient is tolerating PAP therapy well without any problems. The PAP download was reviewed today and showed an AHI of 1/hr on 13 cm H2O with 100% compliance in using more than 4 hours nightly.  The patient has been using and benefiting from CPAP use and will continue to benefit from therapy.  Obesity - I have encouraged him to get into a routine exercise program and cut back on carbs and portions.  Restless Legs syndrome controlled on Requip.       Medication Adjustments/Labs and Tests Ordered: Current medicines are reviewed at length with the patient today.  Concerns regarding medicines are outlined above.  Medication changes, Labs and Tests ordered today are listed in the Patient Instructions below.  There are no Patient Instructions on file for this visit.   Signed, Fransico Him, MD  07/15/2016 8:15 AM    [facility] [address],  [place], [place]  [address] Phone: [phone]; Fax: [phone]

## 2016-07-20 ENCOUNTER — Ambulatory Visit: Payer: Medicare Other | Admitting: Cardiology

## 2017-07-24 ENCOUNTER — Encounter: Payer: Self-pay | Admitting: Cardiology

## 2017-07-25 ENCOUNTER — Ambulatory Visit (INDEPENDENT_AMBULATORY_CARE_PROVIDER_SITE_OTHER): Payer: Medicare Other | Admitting: Cardiology

## 2017-07-25 ENCOUNTER — Encounter: Payer: Self-pay | Admitting: Cardiology

## 2017-07-25 VITALS — BP 138/64 | HR 74 | Ht 59.5 in | Wt 170.0 lb

## 2017-07-25 DIAGNOSIS — R600 Localized edema: Secondary | ICD-10-CM | POA: Diagnosis not present

## 2017-07-25 DIAGNOSIS — G2581 Restless legs syndrome: Secondary | ICD-10-CM | POA: Diagnosis not present

## 2017-07-25 DIAGNOSIS — E669 Obesity, unspecified: Secondary | ICD-10-CM

## 2017-07-25 DIAGNOSIS — Z23 Encounter for immunization: Secondary | ICD-10-CM | POA: Diagnosis not present

## 2017-07-25 DIAGNOSIS — G4733 Obstructive sleep apnea (adult) (pediatric): Secondary | ICD-10-CM

## 2017-07-25 NOTE — Patient Instructions (Signed)
Medication Instructions:   Your physician recommends that you continue on your current medications as directed. Please refer to the Current Medication list given to you today.    Testing/Procedures:  Your physician has requested that you have an echocardiogram. Echocardiography is a painless test that uses sound waves to create images of your heart. It provides your doctor with information about the size and shape of your heart and how well your heart's chambers and valves are working. This procedure takes approximately one hour. There are no restrictions for this procedure.     Follow-Up:  Your physician wants you to follow-up in: [place] will receive a reminder letter in the mail two months in advance. If you don't receive a letter, please call our office to schedule the follow-up appointment.         If you need a refill on your cardiac medications before your next appointment, please call your pharmacy.

## 2017-07-25 NOTE — Progress Notes (Signed)
Cardiology Office Note:    Date:  07/25/2017   ID:  [name], DOB 06/18/1951, MRN [id number]  PCP:  Shirline Frees, MD  Cardiologist:  Fransico Him, MD   Referring MD: Shirline Frees, MD   Chief Complaint  Patient presents with  . Sleep Apnea    History of Present Illness:    [name] is a 66 y.o. female with a hx of OSA, obesity and RLS.  She is doing well with her CPAP device and thinks that she has gotten used to it.  She tolerates the nasal mask and feels the pressure is adequate.  Since going on CPAP she feels rested in the am and has no significant daytime sleepiness.  She denies any significant mouth or nasal dryness or nasal congestion.  She does not think that he snores.  Her RLS is fairly well controlled as long as she is taking her Requip.   Past Medical History:  Diagnosis Date  . Adenomatous polyps   . Anxiety   . Arthritis    low back pain  . Bradycardia, sinus    with cardiac arrest in setting of respiratory arrest during anesthesia - cardiac workup with echo and nuclear showed no ischemia and normal LVF  . Cancer ([place]) 02/2010   basal cell skin cancer  . Chronic cough    occasional  . Depression   . 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 Difficult intubation    difficult intubation week  before February 28, 2013 surgery, was not intubated for 02-28-2013 surgery, but laid flat on stomach   . Difficult intubation    and coded and saw dr Tressia Miners Sharonne Ricketts and was told she had osa and that caused code  . GERD (gastroesophageal reflux disease)   . Headache(784.0)    migraines occasional  . Hyperlipidemia   . Hypothyroidism   . Multiple allergies   . OSA (obstructive sleep apnea)    on CPAP setting of 13  . PONV (postoperative nausea and vomiting)    has to use scop patch, coded during 06-12-20124 surgery on left foot with dr hewitt, sleep apnea found  . RLS (restless legs syndrome)   . Tachycardia, paroxysmal  (HCC)    sinus tachycardia  . Wears glasses     Past Surgical History:  Procedure Laterality Date  . ABDOMINAL HYSTERECTOMY  1988   partial  . APPENDECTOMY     with hyst  . COLONOSCOPY     x3  . ELBOW BURSA SURGERY  2012   lt  . [place]  2010   [facility]  . STRABISMUS SURGERY     age 26 x3  . WISDOM TOOTH EXTRACTION      Current Medications: Current Meds  Medication Sig  . albuterol (PROVENTIL HFA;VENTOLIN HFA) 108 (90 BASE) MCG/ACT inhaler Inhale 2 puffs into the lungs every 6 (six) hours as needed for wheezing.  Marland Kitchen aspirin 325 MG tablet Take 325 mg by mouth every morning.   Marland Kitchen atenolol (TENORMIN) 25 MG tablet Take 25 mg by mouth every morning.   Marland Kitchen azelastine (ASTELIN) 137 MCG/SPRAY nasal spray Place 1 spray into the nose as needed for rhinitis or allergies. Use in each nostril as directed  . cetirizine-pseudoephedrine (ZYRTEC-D) 5-120 MG per tablet Take 1 tablet as needed by mouth.   . Cholecalciferol 4000 UNITS CAPS Take 4,000 Units by mouth daily.  Marland Kitchen docusate sodium (COLACE) 100 MG capsule Take 100 mg by mouth daily  as needed for mild constipation.  Marland Kitchen estradiol (ESTRACE) 1 MG tablet Take 1 mg by mouth every morning.   . fish oil-omega-3 fatty acids 1000 MG capsule Take 2 g by mouth every morning.   . fluticasone (FLONASE) 50 MCG/ACT nasal spray Place 2 sprays into the nose as needed for allergies or rhinitis.   Marland Kitchen ibuprofen (ADVIL,MOTRIN) 200 MG tablet Take 600 mg by mouth every 6 (six) hours as needed for headache or moderate pain.  Marland Kitchen levothyroxine (SYNTHROID, LEVOTHROID) 75 MCG tablet Take 112 mcg by mouth daily before breakfast.   . LORazepam (ATIVAN) 2 MG tablet Take 2 mg by mouth 2 (two) times daily as needed for anxiety.  . montelukast (SINGULAIR) 10 MG tablet Take 10 mg by mouth at bedtime.  . Multiple Vitamins-Minerals (MULTIVITAMIN WITH MINERALS) tablet Take 1 tablet by mouth every morning.   Marland Kitchen rOPINIRole (REQUIP) 0.5 MG tablet Take 0.5 mg by mouth at bedtime.  .  sertraline (ZOLOFT) 100 MG tablet Take 100 mg by mouth every morning.   . traMADol (ULTRAM) 50 MG tablet Take 50 mg by mouth as needed. For pain  . zolmitriptan (ZOMIG) 5 MG tablet Take 5 mg by mouth as needed for migraine.     Allergies:   Dilaudid  [hydromorphone hcl] and Dilaudid [hydromorphone hcl]   Social History   Socioeconomic History  . Marital status: Married    Spouse name: None  . Number of children: None  . Years of education: None  . Highest education level: None  Social Needs  . Financial resource strain: None  . Food insecurity - worry: None  . Food insecurity - inability: None  . Transportation needs - medical: None  . Transportation needs - non-medical: None  Occupational History  . Occupation: retired  Tobacco Use  . Smoking status: Current Some Day Smoker    Packs/day: 0.50    Years: 20.00    Pack years: 10.00    Last attempt to quit: 01/21/2013    Years since quitting: 4.5  . Smokeless tobacco: Never Used  Substance and Sexual Activity  . Alcohol use: Yes    Comment: rare  . Drug use: No  . Sexual activity: None    Comment: trying to cut down  Other Topics Concern  . None  Social History Narrative  . None     Family History: The patient's family history includes Arrhythmia in her father; CAD in her mother; CVA in her father; Cancer - Colon in her paternal uncle and paternal uncle; Colon polyps in her mother; Parkinson's disease in her mother.  ROS:   Please see the history of present illness.    ROS  All other systems reviewed and negative.   EKGs/Labs/Other Studies Reviewed:    The following studies were reviewed today: CPAP download  EKG:  EKG is not ordered today.   Recent Labs: No results found for requested labs within last 8760 hours.   Recent Lipid Panel No results found for: CHOL, TRIG, HDL, CHOLHDL, VLDL, LDLCALC, LDLDIRECT  Physical Exam:    VS:  BP 138/64   Pulse 74   Ht 4' 11.5" (1.511 m)   Wt 170 lb (77.1 kg)   SpO2  96%   BMI 33.76 kg/m     Wt Readings from Last 3 Encounters:  07/25/17 170 lb (77.1 kg)  07/15/16 170 lb 12.8 oz (77.5 kg)  07/13/15 159 lb (72.1 kg)     GEN:  Well nourished, well developed in no  acute distress HEENT: Normal NECK: No JVD; No carotid bruits LYMPHATICS: No lymphadenopathy CARDIAC: RRR, no murmurs, rubs, gallops RESPIRATORY:  Clear to auscultation without rales, wheezing or rhonchi  ABDOMEN: Soft, non-tender, non-distended MUSCULOSKELETAL:  No edema; No deformity  SKIN: Warm and dry NEUROLOGIC:  Alert and oriented x 3 PSYCHIATRIC:  Normal affect   ASSESSMENT:    1. OSA (obstructive sleep apnea)   2. RLS (restless legs syndrome)   3. Obesity (BMI 30-39.9)    PLAN:    In order of problems listed above:  1.  OSA - the patient is tolerating PAP therapy well without any problems. The PAP download was reviewed today and showed an AHI of 0.5/hr on 13 cm H2O with 97% compliance in using more than 4 hours nightly.  The patient has been using and benefiting from CPAP use and will continue to benefit from therapy.   She is concerned about having hip surgery done in January given her prior history of cardiac arrest in the setting of twilight anesthesia.  She has a history of a respiratory arrest during surgery years ago under twilight anesthesia which resulted in significant bradycardia and asystole requiring transient CPR.  With her history of obstructive sleep apnea she is at increased risk from a respiratory standpoint and may want to consider doing her hip replacement under general anesthesia.  I have encouraged her to discuss this with the anesthesiologist prior to the surgery.  I have also encouraged her to bring her CPAP with her to use in the recovery room.   2.  Restless leg syndrome-she will continue on Ropinirole 0.5 mg at bedtime  3.  Obesity-I have encouraged her to get into a routine exercise program and cut back on carbs and portions.    Medication  Adjustments/Labs and Tests Ordered: Current medicines are reviewed at length with the patient today.  Concerns regarding medicines are outlined above.  No orders of the defined types were placed in this encounter.  No orders of the defined types were placed in this encounter.   Signed, Fransico Him, MD  07/25/2017 1:04 PM    [place]

## 2017-07-28 ENCOUNTER — Telehealth: Payer: Self-pay

## 2017-07-28 NOTE — Telephone Encounter (Signed)
   Chart reviewed as part of pre-operative protocol coverage.  Given past medical history and time since last visit, based on ACC/AHA guidelines, [name] would be at acceptable risk for the planned procedure without further cardiovascular testing. Patient previously cleared by Dr. Radford Pax on 07/25/2017 with the following recommendation:   "With her history of obstructive sleep apnea she is at increased risk from a respiratory standpoint and may want to consider doing her hip replacement under general anesthesia.  I have encouraged her to discuss this with the anesthesiologist prior to the surgery.  I have also encouraged her to bring her CPAP with her to use in the recovery room."  Murray Hodgkins, NP 07/28/2017, 4:17 PM

## 2017-07-28 NOTE — Telephone Encounter (Signed)
   [facility] Pre-operative Risk Assessment    Request for surgical clearance:  1. What type of surgery is being performed? Right Hip:  THA w/wo autograft/allograft   2. When is this surgery scheduled? 09/20/2017   3. Are there any medications that need to be held prior to surgery and how long? None   4. Practice name and name of physician performing surgery? [facility]   5. What is your office phone and fax number? [phone]   6. Anesthesia type (None, local, MAC, general) ? None specified   Frederik Schmidt 07/28/2017, 4:00 PM  _________________________________________________________________   (provider comments below)

## 2017-08-02 ENCOUNTER — Other Ambulatory Visit: Payer: Self-pay

## 2017-08-02 ENCOUNTER — Ambulatory Visit (HOSPITAL_COMMUNITY): Payer: Medicare Other | Attending: Cardiology

## 2017-08-02 DIAGNOSIS — Z8249 Family history of ischemic heart disease and other diseases of the circulatory system: Secondary | ICD-10-CM | POA: Insufficient documentation

## 2017-08-02 DIAGNOSIS — Z720 Tobacco use: Secondary | ICD-10-CM | POA: Diagnosis not present

## 2017-08-02 DIAGNOSIS — Z6833 Body mass index (BMI) 33.0-33.9, adult: Secondary | ICD-10-CM | POA: Insufficient documentation

## 2017-08-02 DIAGNOSIS — G4733 Obstructive sleep apnea (adult) (pediatric): Secondary | ICD-10-CM | POA: Diagnosis not present

## 2017-08-02 DIAGNOSIS — R600 Localized edema: Secondary | ICD-10-CM | POA: Insufficient documentation

## 2017-08-02 DIAGNOSIS — E669 Obesity, unspecified: Secondary | ICD-10-CM | POA: Diagnosis not present

## 2017-08-02 DIAGNOSIS — E785 Hyperlipidemia, unspecified: Secondary | ICD-10-CM | POA: Diagnosis not present

## 2017-08-02 DIAGNOSIS — I10 Essential (primary) hypertension: Secondary | ICD-10-CM | POA: Diagnosis not present

## 2017-08-27 ENCOUNTER — Other Ambulatory Visit: Payer: Self-pay | Admitting: Orthopedic Surgery

## 2017-08-27 ENCOUNTER — Ambulatory Visit: Payer: Self-pay | Admitting: Orthopedic Surgery

## 2017-08-27 NOTE — H&P (Signed)
[name] DOB: [birth year] Married / Language: English / Race: White Female Date of Admission:  09/20/2017 CC:  Right hip pain History of Present Illness  The patient is a 66 year old female who comes in  for a preoperative History and Physical. The patient is scheduled for a right total hip arthroplasty (anterior) to be performed by Dr. Dione Plover. [name], MD at [facility] on 09-20-2017. The patient is a 66 year old female who presented with a hip problem. The patient reports right hip and right posterior hip (buttocks) problems including pain and catching symptoms that have been present for month(s). The symptoms began without any known injury. Symptoms reported include pain with weightbearing. The patient reports symptoms radiating to the: right groin, right thigh posteriorly and right knee. The patient describes the hip problem as dull and aching. Onset of symptoms was gradual. Current treatment includes application of ice, application of heat and nonsteroidal anti-inflammatory drugs (Ibuprofen). Prior to being seen today the patient was previously evaluated by a primary physician. She is also having groin pain associated with this. Those radiate down her anterior thigh. She has noted some loss of motion also. Symptoms of stabilize but she still is having discomfort. She is not having any left-sided hip pain. She is not having any lower extremity weakness or paresthesia. AP pelvis and lateral of the RIGHT hip shows she does have some degenerative change in the hip. He is not fully bone-on-bone with degenerative changes are present. 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 She got some relief from the injection done earlier this year but it wore off very quickly. We did discuss options and she would like to move forward with total hip replacement. They have been treated conservatively in the past for the above stated  problem and despite conservative measures, they continue to have progressive pain and severe functional limitations and dysfunction. They have failed non-operative management including home exercise, medications, and injections. It is felt that they would benefit from undergoing total joint replacement. Risks and benefits of the procedure have been discussed with the patient and they elect to proceed with surgery. There are no active contraindications to surgery such as ongoing infection or rapidly progressive neurological disease.   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 current every day smoker; smoke(d) 1 pack(s) per day Living situation  live with spouse Current work status  retired Illicit drug use  no Number of flights of stairs before winded  2-3 Children  2 Alcohol use  current drinker; drinks wine; only occasionally per week Exercise  Exercises weekly; does running / walking Exercises monthly; does other Copy of Drug/Alcohol Rehab (Previously)  no Drug/Alcohol Rehab (Currently)  no Post-Surgical Plans  Home With Family, Home with HHPT.  Medication History ZyrTEC-D Allergy & Congestion (5-120MG  Tablet ER 12HR, Oral) Active. Synthroid (Oral) Specific strength unknown - Active. VESIcare (10MG  Tablet, Oral) Active. Atenolol (25MG  Tablet, Oral) Active. Estradiol (1MG  Tablet, Oral)  Active. Zoloft (Oral) Specific strength unknown - Active. Singulair (10MG   Tablet, Oral) Active. ROPINIRole HCl (0.5MG  Tablet, Oral) Active. Vitamin D-3 Active. Potassium Active. Citracal + D (250-200MG -UNIT Tablet, Oral) Active. One-A-Day Multivitamin Active. Fish Oil (1360MG  Capsule, Oral) Active. B Complex 1 (Oral) Active. Flovent (110MCG/ACT Aerosol, Inhalation) Active. Pro-Air Inhaler Active. Zomig Active. TraMADol HCl (50MG  Tablet, Oral) Active. Norco (5-325MG  Tablet, 1 (one) Tablet Oral PO QHS PRN pain, Taken starting 08/17/2017) Active. LORazepam (2MG  Tablet, Oral as needed) Active. Aspirin 325 mg Active. Advil / Ibuprofen Active.   Past Surgical History  Hysterectomy  partial (non-cancerous) Colon Polyp Removal - Colonoscopy  Skin Cancer Excision Left Leg    Review of Systems General Not Present- Chills, Fatigue, Fever, Memory Loss, Night Sweats, Weight Gain and Weight Loss. Skin Not Present- Eczema, Hives, Itching, Lesions and Rash. HEENT Not Present- Dentures, Double Vision, Headache, Hearing Loss, Tinnitus and Visual Loss. Respiratory Present- Allergies and Shortness of breath with exertion. Not Present- Chronic Cough, Coughing up blood and Shortness of breath at rest. Cardiovascular Not Present- Chest Pain, Difficulty Breathing Lying Down, Murmur, Palpitations, Racing/skipping heartbeats and Swelling. Gastrointestinal Present- Constipation. Not Present- Abdominal Pain, Bloody Stool, Diarrhea, Difficulty Swallowing, Heartburn, Jaundice, Loss of appetitie, Nausea and Vomiting. Female Genitourinary Not Present- Blood in Urine, Discharge, Flank Pain, Incontinence, Painful Urination, Urgency, Urinary frequency, Urinary Retention, Urinating at Night and Weak urinary stream. Musculoskeletal Present- Joint Pain (Right Hip Pain). Not Present- Back Pain, Joint Swelling, Morning Stiffness, Muscle Pain, Muscle Weakness and Spasms. Neurological Not Present- Blackout spells, Difficulty with balance, Dizziness, Paralysis, Tremor and  Weakness. Psychiatric Not Present- Insomnia.  Vitals Weight: 165 lb Height: 60in Weight was reported by patient. Height was reported by patient. Body Surface Area: 1.72 m Body Mass Index: 32.22 kg/m  Pulse: 80 (Regular)  Resp.: 20 (Unlabored)  BP: 118/62 (Sitting, Left Arm, Standard)   Physical Exam General Mental Status -Alert, cooperative and good historian. General Appearance-pleasant, Not in acute distress. Orientation-Oriented X3. Build & Nutrition-Well nourished and Well developed.  Head and Neck Head-normocephalic, atraumatic . Neck Global Assessment - supple, no bruit auscultated on the right, no bruit auscultated on the left.  Eye Pupil - Bilateral-Regular and Round. Motion - Bilateral-EOMI.  Chest and Lung Exam Auscultation Breath sounds - clear at anterior chest wall and clear at posterior chest wall. Adventitious sounds - No Adventitious sounds.  Cardiovascular Auscultation Rhythm - Regular rate and rhythm. Heart Sounds - S1 WNL and S2 WNL. Murmurs & Other Heart Sounds - Auscultation of the heart reveals - No Murmurs.  Abdomen Inspection Contour - Generalized mild distention. Palpation/Percussion Tenderness - Abdomen is non-tender to palpation. Rigidity (guarding) - Abdomen is soft. Auscultation Auscultation of the abdomen reveals - Bowel sounds normal.  Female Genitourinary Note: Not done, not pertinent to present illness   Musculoskeletal Note: She is tender along the right greater trochanter. She has pain with passive and active motion of the right hip. Flexion 1120 degrees. Internal rotation 10 degrees. External rotation and abduction 20 degrees. Sensation and motor function intact in LE. Distal pulses 2+.   Assessment & Plan Primary osteoarthritis of right hip (M16.11)  Note:Surgical Plans: Right Total Hip Replacement - Anterior Approach  Disposition: Home  PCP: Dr. Azalia Bilis Cards: Dr. Fransico Him  IV TXA  Anesthesia Issues: Previous Cardiac Arrest during Achilles Surgery in 2014  Patient was instructed on what medications to stop prior to surgery.  Signed electronically by Joelene Millin, III PA-C

## 2017-08-27 NOTE — H&P (View-Only) (Signed)
[name] DOB: 1951/06/28 Married / Language: English / Race: White Female Date of Admission:  09/20/2017 CC:  Right hip pain History of Present Illness  The patient is a 66 year old female who comes in  for a preoperative History and Physical. The patient is scheduled for a right total hip arthroplasty (anterior) to be performed by Dr. Dione Plover. [name], MD at [facility] on 09-20-2017. The patient is a 66 year old female who presented with a hip problem. The patient reports right hip and right posterior hip (buttocks) problems including pain and catching symptoms that have been present for month(s). The symptoms began without any known injury. Symptoms reported include pain with weightbearing. The patient reports symptoms radiating to the: right groin, right thigh posteriorly and right knee. The patient describes the hip problem as dull and aching. Onset of symptoms was gradual. Current treatment includes application of ice, application of heat and nonsteroidal anti-inflammatory drugs (Ibuprofen). Prior to being seen today the patient was previously evaluated by a primary physician. She is also having groin pain associated with this. Those radiate down her anterior thigh. She has noted some loss of motion also. Symptoms of stabilize but she still is having discomfort. She is not having any left-sided hip pain. She is not having any lower extremity weakness or paresthesia. AP pelvis and lateral of the RIGHT hip shows she does have some degenerative change in the hip. He is not fully bone-on-bone with degenerative changes are present. 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 She got some relief from the injection done earlier this year but it wore off very quickly. We did discuss options and she would like to move forward with total hip replacement. They have been treated conservatively in the past for the above stated  problem and despite conservative measures, they continue to have progressive pain and severe functional limitations and dysfunction. They have failed non-operative management including home exercise, medications, and injections. It is felt that they would benefit from undergoing total joint replacement. Risks and benefits of the procedure have been discussed with the patient and they elect to proceed with surgery. There are no active contraindications to surgery such as ongoing infection or rapidly progressive neurological disease.   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 current every day smoker; smoke(d) 1 pack(s) per day Living situation  live with spouse Current work status  retired Illicit drug use  no Number of flights of stairs before winded  2-3 Children  2 Alcohol use  current drinker; drinks wine; only occasionally per week Exercise  Exercises weekly; does running / walking Exercises monthly; does other Copy of Drug/Alcohol Rehab (Previously)  no Drug/Alcohol Rehab (Currently)  no Post-Surgical Plans  Home With Family, Home with HHPT.  Medication History ZyrTEC-D Allergy & Congestion (5-120MG  Tablet ER 12HR, Oral) Active. Synthroid (Oral) Specific strength unknown - Active. VESIcare (10MG  Tablet, Oral) Active. Atenolol (25MG  Tablet, Oral) Active. Estradiol (1MG  Tablet, Oral)  Active. Zoloft (Oral) Specific strength unknown - Active. Singulair (10MG   Tablet, Oral) Active. ROPINIRole HCl (0.5MG  Tablet, Oral) Active. Vitamin D-3 Active. Potassium Active. Citracal + D (250-200MG -UNIT Tablet, Oral) Active. One-A-Day Multivitamin Active. Fish Oil (1360MG  Capsule, Oral) Active. B Complex 1 (Oral) Active. Flovent (110MCG/ACT Aerosol, Inhalation) Active. Pro-Air Inhaler Active. Zomig Active. TraMADol HCl (50MG  Tablet, Oral) Active. Norco (5-325MG  Tablet, 1 (one) Tablet Oral PO QHS PRN pain, Taken starting 08/17/2017) Active. LORazepam (2MG  Tablet, Oral as needed) Active. Aspirin 325 mg Active. Advil / Ibuprofen Active.   Past Surgical History  Hysterectomy  partial (non-cancerous) Colon Polyp Removal - Colonoscopy  Skin Cancer Excision Left Leg    Review of Systems General Not Present- Chills, Fatigue, Fever, Memory Loss, Night Sweats, Weight Gain and Weight Loss. Skin Not Present- Eczema, Hives, Itching, Lesions and Rash. HEENT Not Present- Dentures, Double Vision, Headache, Hearing Loss, Tinnitus and Visual Loss. Respiratory Present- Allergies and Shortness of breath with exertion. Not Present- Chronic Cough, Coughing up blood and Shortness of breath at rest. Cardiovascular Not Present- Chest Pain, Difficulty Breathing Lying Down, Murmur, Palpitations, Racing/skipping heartbeats and Swelling. Gastrointestinal Present- Constipation. Not Present- Abdominal Pain, Bloody Stool, Diarrhea, Difficulty Swallowing, Heartburn, Jaundice, Loss of appetitie, Nausea and Vomiting. Female Genitourinary Not Present- Blood in Urine, Discharge, Flank Pain, Incontinence, Painful Urination, Urgency, Urinary frequency, Urinary Retention, Urinating at Night and Weak urinary stream. Musculoskeletal Present- Joint Pain (Right Hip Pain). Not Present- Back Pain, Joint Swelling, Morning Stiffness, Muscle Pain, Muscle Weakness and Spasms. Neurological Not Present- Blackout spells, Difficulty with balance, Dizziness, Paralysis, Tremor and  Weakness. Psychiatric Not Present- Insomnia.  Vitals Weight: 165 lb Height: 60in Weight was reported by patient. Height was reported by patient. Body Surface Area: 1.72 m Body Mass Index: 32.22 kg/m  Pulse: 80 (Regular)  Resp.: 20 (Unlabored)  BP: 118/62 (Sitting, Left Arm, Standard)   Physical Exam General Mental Status -Alert, cooperative and good historian. General Appearance-pleasant, Not in acute distress. Orientation-Oriented X3. Build & Nutrition-Well nourished and Well developed.  Head and Neck Head-normocephalic, atraumatic . Neck Global Assessment - supple, no bruit auscultated on the right, no bruit auscultated on the left.  Eye Pupil - Bilateral-Regular and Round. Motion - Bilateral-EOMI.  Chest and Lung Exam Auscultation Breath sounds - clear at anterior chest wall and clear at posterior chest wall. Adventitious sounds - No Adventitious sounds.  Cardiovascular Auscultation Rhythm - Regular rate and rhythm. Heart Sounds - S1 WNL and S2 WNL. Murmurs & Other Heart Sounds - Auscultation of the heart reveals - No Murmurs.  Abdomen Inspection Contour - Generalized mild distention. Palpation/Percussion Tenderness - Abdomen is non-tender to palpation. Rigidity (guarding) - Abdomen is soft. Auscultation Auscultation of the abdomen reveals - Bowel sounds normal.  Female Genitourinary Note: Not done, not pertinent to present illness   Musculoskeletal Note: She is tender along the right greater trochanter. She has pain with passive and active motion of the right hip. Flexion 1120 degrees. Internal rotation 10 degrees. External rotation and abduction 20 degrees. Sensation and motor function intact in LE. Distal pulses 2+.   Assessment & Plan Primary osteoarthritis of right hip (M16.11)  Note:Surgical Plans: Right Total Hip Replacement - Anterior Approach  Disposition: Home  PCP: Dr. Azalia Bilis Cards: Dr. Fransico Him  IV TXA  Anesthesia Issues: Previous Cardiac Arrest during Achilles Surgery in 2014  Patient was instructed on what medications to stop prior to surgery.  Signed electronically by Joelene Millin, III PA-C

## 2017-09-12 NOTE — H&P (Signed)
[name] DOB: [birth year] Married / Language: English / Race: White Female Date of Admission:  09/20/2017 CC:  Right hip pain History of Present Illness  The patient is a 66 year old female who comes in  for a preoperative History and Physical. The patient is scheduled for a right total hip arthroplasty (anterior) to be performed by Dr. Dione Plover. [name], MD at [facility] on 09-20-2017. The patient is a 66 year old female who presented with a hip problem. The patient reports right hip and right posterior hip (buttocks) problems including pain and catching symptoms that have been present for month(s). The symptoms began without any known injury. Symptoms reported include pain with weightbearing. The patient reports symptoms radiating to the: right groin, right thigh posteriorly and right knee. The patient describes the hip problem as dull and aching. Onset of symptoms was gradual. Current treatment includes application of ice, application of heat and nonsteroidal anti-inflammatory drugs (Ibuprofen). Prior to being seen today the patient was previously evaluated by a primary physician. She is also having groin pain associated with this. Those radiate down her anterior thigh. She has noted some loss of motion also. Symptoms of stabilize but she still is having discomfort. She is not having any left-sided hip pain. She is not having any lower extremity weakness or paresthesia. AP pelvis and lateral of the RIGHT hip shows she does have some degenerative change in the hip. He is not fully bone-on-bone with degenerative changes are present. 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 She got some relief from the injection done earlier this year but it wore off very quickly. We did discuss options and she would like to move forward with total hip replacement. They have been treated conservatively in the past for the above stated  problem and despite conservative measures, they continue to have progressive pain and severe functional limitations and dysfunction. They have failed non-operative management including home exercise, medications, and injections. It is felt that they would benefit from undergoing total joint replacement. Risks and benefits of the procedure have been discussed with the patient and they elect to proceed with surgery. There are no active contraindications to surgery such as ongoing infection or rapidly progressive neurological disease.   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 current every day smoker; smoke(d) 1 pack(s) per day Living situation  live with spouse Current work status  retired Illicit drug use  no Number of flights of stairs before winded  2-3 Children  2 Alcohol use  current drinker; drinks wine; only occasionally per week Exercise  Exercises weekly; does running / walking Exercises monthly; does other Copy of Drug/Alcohol Rehab (Previously)  no Drug/Alcohol Rehab (Currently)  no Post-Surgical Plans  Home With Family, Home with HHPT.  Medication History ZyrTEC-D Allergy & Congestion (5-120MG  Tablet ER 12HR, Oral) Active. Synthroid (Oral) Specific strength unknown - Active. VESIcare (10MG  Tablet, Oral) Active. Atenolol (25MG  Tablet, Oral) Active. Estradiol (1MG  Tablet, Oral)  Active. Zoloft (Oral) Specific strength unknown - Active. Singulair (10MG   Tablet, Oral) Active. ROPINIRole HCl (0.5MG  Tablet, Oral) Active. Vitamin D-3 Active. Potassium Active. Citracal + D (250-200MG -UNIT Tablet, Oral) Active. One-A-Day Multivitamin Active. Fish Oil (1360MG  Capsule, Oral) Active. B Complex 1 (Oral) Active. Flovent (110MCG/ACT Aerosol, Inhalation) Active. Pro-Air Inhaler Active. Zomig Active. TraMADol HCl (50MG  Tablet, Oral) Active. Norco (5-325MG  Tablet, 1 (one) Tablet Oral PO QHS PRN pain, Taken starting 08/17/2017) Active. LORazepam (2MG  Tablet, Oral as needed) Active. Aspirin 325 mg Active. Advil / Ibuprofen Active.   Past Surgical History  Hysterectomy  partial (non-cancerous) Colon Polyp Removal - Colonoscopy  Skin Cancer Excision Left Leg    Review of Systems General Not Present- Chills, Fatigue, Fever, Memory Loss, Night Sweats, Weight Gain and Weight Loss. Skin Not Present- Eczema, Hives, Itching, Lesions and Rash. HEENT Not Present- Dentures, Double Vision, Headache, Hearing Loss, Tinnitus and Visual Loss. Respiratory Present- Allergies and Shortness of breath with exertion. Not Present- Chronic Cough, Coughing up blood and Shortness of breath at rest. Cardiovascular Not Present- Chest Pain, Difficulty Breathing Lying Down, Murmur, Palpitations, Racing/skipping heartbeats and Swelling. Gastrointestinal Present- Constipation. Not Present- Abdominal Pain, Bloody Stool, Diarrhea, Difficulty Swallowing, Heartburn, Jaundice, Loss of appetitie, Nausea and Vomiting. Female Genitourinary Not Present- Blood in Urine, Discharge, Flank Pain, Incontinence, Painful Urination, Urgency, Urinary frequency, Urinary Retention, Urinating at Night and Weak urinary stream. Musculoskeletal Present- Joint Pain (Right Hip Pain). Not Present- Back Pain, Joint Swelling, Morning Stiffness, Muscle Pain, Muscle Weakness and Spasms. Neurological Not Present- Blackout spells, Difficulty with balance, Dizziness, Paralysis, Tremor and  Weakness. Psychiatric Not Present- Insomnia.  Vitals Weight: 165 lb Height: 60in Weight was reported by patient. Height was reported by patient. Body Surface Area: 1.72 m Body Mass Index: 32.22 kg/m  Pulse: 80 (Regular)  Resp.: 20 (Unlabored)  BP: 118/62 (Sitting, Left Arm, Standard)   Physical Exam General Mental Status -Alert, cooperative and good historian. General Appearance-pleasant, Not in acute distress. Orientation-Oriented X3. Build & Nutrition-Well nourished and Well developed.  Head and Neck Head-normocephalic, atraumatic . Neck Global Assessment - supple, no bruit auscultated on the right, no bruit auscultated on the left.  Eye Pupil - Bilateral-Regular and Round. Motion - Bilateral-EOMI.  Chest and Lung Exam Auscultation Breath sounds - clear at anterior chest wall and clear at posterior chest wall. Adventitious sounds - No Adventitious sounds.  Cardiovascular Auscultation Rhythm - Regular rate and rhythm. Heart Sounds - S1 WNL and S2 WNL. Murmurs & Other Heart Sounds - Auscultation of the heart reveals - No Murmurs.  Abdomen Inspection Contour - Generalized mild distention. Palpation/Percussion Tenderness - Abdomen is non-tender to palpation. Rigidity (guarding) - Abdomen is soft. Auscultation Auscultation of the abdomen reveals - Bowel sounds normal.  Female Genitourinary Note: Not done, not pertinent to present illness   Musculoskeletal Note: She is tender along the right greater trochanter. She has pain with passive and active motion of the right hip. Flexion 1120 degrees. Internal rotation 10 degrees. External rotation and abduction 20 degrees. Sensation and motor function intact in LE. Distal pulses 2+.   Assessment & Plan Primary osteoarthritis of right hip (M16.11)  Note:Surgical Plans: Right Total Hip Replacement - Anterior Approach  Disposition: Home  PCP: Dr. Azalia Bilis Cards: Dr.  Fransico Him  IV TXA  Anesthesia Issues: Previous Cardiac Arrest during Achilles Surgery in 2014  Patient was instructed on what medications to stop prior to surgery.  Signed electronically by Joelene Millin, III PA-C

## 2017-09-13 NOTE — Progress Notes (Signed)
07-28-17 (Epic) Cardiac clearance via telephone encounter per Murray Hodgkins, NP  08-02-17 (Epic) ECHO

## 2017-09-13 NOTE — Patient Instructions (Addendum)
[name]  09/13/2017   Your procedure is scheduled on: 09-20-17   Report to [facility] Main  Entrance Report to Admitting at 6:15 AM.    Call this number if you have problems the morning of surgery  [phone]   Remember: ONLY 1 PERSON MAY GO WITH YOU TO SHORT STAY TO GET  READY MORNING OF [place].  Do not eat food or drink liquids :After Midnight.     Take these medicines the morning of surgery with A SIP OF WATER: Atenolol (Tenormin), Levothyroxine (Synthroid), and Sertraline (Zoloft). You may also bring and use your inhaler as needed.                                You may not have any metal on your body including hair pins and              piercings  Do not wear jewelry, make-up, lotions, powders or perfumes, deodorant             Do not wear nail polish.  Do not shave  48 hours prior to surgery.     Do not bring valuables to the hospital. [place].  Contacts, dentures or bridgework may not be worn into surgery.  Leave suitcase in the car. After surgery it may be brought to your room.   Please bring your mask and tubing for your CPAP               Please read over the following fact sheets you were given: _____________________________________________________________________             [facility] - Preparing for Surgery Before surgery, you can play an important role.  Because skin is not sterile, your skin needs to be as free of germs as possible.  You can reduce the number of germs on your skin by washing with CHG (chlorahexidine gluconate) soap before surgery.  CHG is an antiseptic cleaner which kills germs and bonds with the skin to continue killing germs even after washing. Please DO NOT use if you have an allergy to CHG or antibacterial soaps.  If your skin becomes reddened/irritated stop using the CHG and inform your nurse when you arrive at Short Stay. Do not shave (including legs  and underarms) for at least 48 hours prior to the first CHG shower.  You may shave your face/neck. Please follow these instructions carefully:  1.  Shower with CHG Soap the night before surgery and the  morning of Surgery.  2.  If you choose to wash your hair, wash your hair first as usual with your  normal  shampoo.  3.  After you shampoo, rinse your hair and body thoroughly to remove the  shampoo.                           4.  Use CHG as you would any other liquid soap.  You can apply chg directly  to the skin and wash                       Gently with a scrungie or clean washcloth.  5.  Apply  the CHG Soap to your body ONLY FROM THE NECK DOWN.   Do not use on face/ open                           Wound or open sores. Avoid contact with eyes, ears mouth and genitals (private parts).                       Wash face,  Genitals (private parts) with your normal soap.             6.  Wash thoroughly, paying special attention to the area where your surgery  will be performed.  7.  Thoroughly rinse your body with warm water from the neck down.  8.  DO NOT shower/wash with your normal soap after using and rinsing off  the CHG Soap.                9.  Pat yourself dry with a clean towel.            10.  Wear clean pajamas.            11.  Place clean sheets on your bed the night of your first shower and do not  sleep with pets. Day of Surgery : Do not apply any lotions/deodorants the morning of surgery.  Please wear clean clothes to the hospital/surgery center.  FAILURE TO FOLLOW THESE INSTRUCTIONS MAY RESULT IN THE CANCELLATION OF YOUR SURGERY PATIENT SIGNATURE_________________________________  NURSE SIGNATURE__________________________________  ________________________________________________________________________   Adam Phenix  An incentive spirometer is a tool that can help keep your lungs clear and active. This tool measures how well you are filling your lungs with each breath.  Taking long deep breaths may help reverse or decrease the chance of developing breathing (pulmonary) problems (especially infection) following:  A long period of time when you are unable to move or be active. BEFORE THE PROCEDURE   If the spirometer includes an indicator to show your best effort, your nurse or respiratory therapist will set it to a desired goal.  If possible, sit up straight or lean slightly forward. Try not to slouch.  Hold the incentive spirometer in an upright position. INSTRUCTIONS FOR USE  1. Sit on the edge of your bed if possible, or sit up as far as you can in bed or on a chair. 2. Hold the incentive spirometer in an upright position. 3. Breathe out normally. 4. Place the mouthpiece in your mouth and seal your lips tightly around it. 5. Breathe in slowly and as deeply as possible, raising the piston or the ball toward the top of the column. 6. Hold your breath for 3-5 seconds or for as long as possible. Allow the piston or ball to fall to the bottom of the column. 7. Remove the mouthpiece from your mouth and breathe out normally. 8. Rest for a few seconds and repeat Steps 1 through 7 at least 10 times every 1-2 hours when you are awake. Take your time and take a few normal breaths between deep breaths. 9. The spirometer may include an indicator to show your best effort. Use the indicator as a goal to work toward during each repetition. 10. After each set of 10 deep breaths, practice coughing to be sure your lungs are clear. If you have an incision (the cut made at the time of surgery), support your incision when coughing by placing a pillow or rolled  up towels firmly against it. Once you are able to get out of bed, walk around indoors and cough well. You may stop using the incentive spirometer when instructed by your caregiver.  RISKS AND COMPLICATIONS  Take your time so you do not get dizzy or light-headed.  If you are in pain, you may need to take or ask for pain  medication before doing incentive spirometry. It is harder to take a deep breath if you are having pain. AFTER USE  Rest and breathe slowly and easily.  It can be helpful to keep track of a log of your progress. Your caregiver can provide you with a simple table to help with this. If you are using the spirometer at home, follow these instructions: [place] IF:   You are having difficultly using the spirometer.  You have trouble using the spirometer as often as instructed.  Your pain medication is not giving enough relief while using the spirometer.  You develop fever of 100.5 F (38.1 C) or higher. SEEK IMMEDIATE MEDICAL CARE IF:   You cough up bloody sputum that had not been present before.  You develop fever of 102 F (38.9 C) or greater.  You develop worsening pain at or near the incision site. MAKE SURE YOU:   Understand these instructions.  Will watch your condition.  Will get help right away if you are not doing well or get worse. Document Released: 01/16/2007 Document Revised: 11/28/2011 Document Reviewed: 03/19/2007 ExitCare Patient Information 2014 Exit[employer], [place].   ________________________________________________________________________  WHAT IS A BLOOD TRANSFUSION? Blood Transfusion Information  A transfusion is the replacement of blood or some of its parts. Blood is made up of multiple cells which provide different functions.  Red blood cells carry oxygen and are used for blood loss replacement.  White blood cells fight against infection.  Platelets control bleeding.  Plasma helps clot blood.  Other blood products are available for specialized needs, such as hemophilia or other clotting disorders. BEFORE THE TRANSFUSION  Who gives blood for transfusions?   Healthy volunteers who are fully evaluated to make sure their blood is safe. This is blood bank blood. Transfusion therapy is the safest it has ever been in the practice of medicine.  Before blood is taken from a donor, a complete history is taken to make sure that person has no history of diseases nor engages in risky social behavior (examples are intravenous drug use or sexual activity with multiple partners). The donor's travel history is screened to minimize risk of transmitting infections, such as malaria. The donated blood is tested for signs of infectious diseases, such as HIV and hepatitis. The blood is then tested to be sure it is compatible with you in order to minimize the chance of a transfusion reaction. If you or a relative donates blood, this is often done in anticipation of surgery and is not appropriate for emergency situations. It takes many days to process the donated blood. RISKS AND COMPLICATIONS Although transfusion therapy is very safe and saves many lives, the main dangers of transfusion include:   Getting an infectious disease.  Developing a transfusion reaction. This is an allergic reaction to something in the blood you were given. Every precaution is taken to prevent this. The decision to have a blood transfusion has been considered carefully by your caregiver before blood is given. Blood is not given unless the benefits outweigh the risks. AFTER THE TRANSFUSION  Right after receiving a blood transfusion, you will usually feel  much better and more energetic. This is especially true if your red blood cells have gotten low (anemic). The transfusion raises the level of the red blood cells which carry oxygen, and this usually causes an energy increase.  The nurse administering the transfusion will monitor you carefully for complications. HOME CARE INSTRUCTIONS  No special instructions are needed after a transfusion. You may find your energy is better. Speak with your caregiver about any limitations on activity for underlying diseases you may have. SEEK MEDICAL CARE IF:   Your condition is not improving after your transfusion.  You develop redness or  irritation at the intravenous (IV) site. SEEK IMMEDIATE MEDICAL CARE IF:  Any of the following symptoms occur over the next 12 hours:  Shaking chills.  You have a temperature by mouth above 102 F (38.9 C), not controlled by medicine.  Chest, back, or muscle pain.  People around you feel you are not acting correctly or are confused.  Shortness of breath or difficulty breathing.  Dizziness and fainting.  You get a rash or develop hives.  You have a decrease in urine output.  Your urine turns a dark color or changes to pink, red, or brown. Any of the following symptoms occur over the next 10 days:  You have a temperature by mouth above 102 F (38.9 C), not controlled by medicine.  Shortness of breath.  Weakness after normal activity.  The white part of the eye turns yellow (jaundice).  You have a decrease in the amount of urine or are urinating less often.  Your urine turns a dark color or changes to pink, red, or brown. Document Released: 09/02/2000 Document Revised: 11/28/2011 Document Reviewed: 04/21/2008 [facility] Patient Information 2014 [place], [place].  _______________________________________________________________________

## 2017-09-14 ENCOUNTER — Other Ambulatory Visit: Payer: Self-pay

## 2017-09-14 ENCOUNTER — Encounter (HOSPITAL_COMMUNITY)
Admission: RE | Admit: 2017-09-14 | Discharge: 2017-09-14 | Disposition: A | Discharge status: Home / Self Care | Payer: Medicare Other | Source: Ambulatory Visit | Attending: Orthopedic Surgery | Admitting: Orthopedic Surgery

## 2017-09-14 ENCOUNTER — Encounter (HOSPITAL_COMMUNITY): Payer: Self-pay

## 2017-09-14 DIAGNOSIS — M1611 Unilateral primary osteoarthritis, right hip: Secondary | ICD-10-CM | POA: Insufficient documentation

## 2017-09-14 DIAGNOSIS — Z01812 Encounter for preprocedural laboratory examination: Secondary | ICD-10-CM | POA: Diagnosis not present

## 2017-09-14 DIAGNOSIS — Z01810 Encounter for preprocedural cardiovascular examination: Secondary | ICD-10-CM | POA: Insufficient documentation

## 2017-09-14 LAB — COMPREHENSIVE METABOLIC PANEL
ALBUMIN: 3.6 g/dL (ref 3.5–5.0)
ALT: 14 U/L (ref 14–54)
ANION GAP: 7 (ref 5–15)
AST: 15 U/L (ref 15–41)
Alkaline Phosphatase: 75 U/L (ref 38–126)
BUN: <5 mg/dL — ABNORMAL LOW (ref 6–20)
CHLORIDE: 100 mmol/L — AB (ref 101–111)
CO2: 26 mmol/L (ref 22–32)
Calcium: 9 mg/dL (ref 8.9–10.3)
Creatinine, Ser: 0.6 mg/dL (ref 0.44–1.00)
GFR calc Af Amer: >60 mL/min (ref >60)
GFR calc non Af Amer: >60 mL/min (ref >60)
GLUCOSE: 103 mg/dL — AB (ref 65–99)
POTASSIUM: 4.3 mmol/L (ref 3.5–5.1)
SODIUM: 133 mmol/L — AB (ref 135–145)
TOTAL PROTEIN: 6.9 g/dL (ref 6.5–8.1)
Total Bilirubin: 0.5 mg/dL (ref 0.3–1.2)

## 2017-09-14 LAB — PROTIME-INR
INR: 1.03
Prothrombin Time: 13.4 seconds (ref 11.4–15.2)

## 2017-09-14 LAB — CBC
HCT: 40.4 % (ref 36.0–46.0)
Hemoglobin: 13.5 g/dL (ref 12.0–15.0)
MCH: 28.4 pg (ref 26.0–34.0)
MCHC: 33.4 g/dL (ref 30.0–36.0)
MCV: 85.1 fL (ref 78.0–100.0)
PLATELETS: 353 10*3/uL (ref 150–400)
RBC: 4.75 MIL/uL (ref 3.87–5.11)
RDW: 13.1 % (ref 11.5–15.5)
WBC: 8.4 10*3/uL (ref 4.0–10.5)

## 2017-09-14 LAB — SURGICAL PCR SCREEN
MRSA, PCR: NEGATIVE
Staphylococcus aureus: NEGATIVE

## 2017-09-14 LAB — APTT: APTT: 30 s (ref 24–36)

## 2017-09-14 LAB — ABO/RH: ABO/RH(D): B NEG

## 2017-09-14 NOTE — Progress Notes (Signed)
09-14-17 CMP result routed to Dr. Wynelle Link for review

## 2017-09-19 NOTE — Anesthesia Preprocedure Evaluation (Addendum)
Anesthesia Evaluation  Patient identified by MRN, date of birth, ID band Patient awake and Patient unresponsive    Reviewed: Allergy & Precautions, H&P , NPO status , Patient's Chart, lab work & pertinent test results, reviewed documented beta blocker date and time   History of Anesthesia Complications (+) PONV, DIFFICULT AIRWAY and history of anesthetic complications  Airway Mallampati: II  TM Distance: >3 FB Neck ROM: Full    Dental  (+) Caps, Dental Advisory Given All front upper teeth are capped:   Pulmonary sleep apnea , Current Smoker, former smoker,    Pulmonary exam normal breath sounds clear to auscultation       Cardiovascular Exercise Tolerance: Good negative cardio ROS Normal cardiovascular exam Rhythm:regular Rate:Normal  Bradycardia with sinus arrest.  Cardiac work up normal. History tachycardia too.   Neuro/Psych  Headaches, PSYCHIATRIC DISORDERS Anxiety Depression negative neurological ROS  negative psych ROS   GI/Hepatic negative GI ROS, Neg liver ROS,   Endo/Other  negative endocrine ROSHypothyroidism   Renal/GU negative Renal ROS  negative genitourinary   Musculoskeletal   Abdominal   Peds  Hematology negative hematology ROS (+)   Anesthesia Other Findings History of cardiac arrest shortly after induction in 2014     ECHO 18  Impressions:  - Normal LV size with mild LV hypertrophy. EF 60-65%. Normal RV   size and systolic function. No significant valvular   abnormalities.   Reproductive/Obstetrics negative OB ROS                            Anesthesia Physical  Anesthesia Plan  ASA: III  Anesthesia Plan: Spinal   Post-op Pain Management:    Induction:   PONV Risk Score and Plan: 3 and Ondansetron, Treatment may vary due to age or medical condition and Propofol infusion  Airway Management Planned: Natural Airway and Nasal Cannula  Additional Equipment:    Intra-op Plan:   Post-operative Plan:   Informed Consent: I have reviewed the patients History and Physical, chart, labs and discussed the procedure including the risks, benefits and alternatives for the proposed anesthesia with the patient or authorized representative who has indicated his/her understanding and acceptance.   Dental Advisory Given  Plan Discussed with: CRNA, Surgeon and Anesthesiologist  Anesthesia Plan Comments: (  )        Anesthesia Quick Evaluation

## 2017-09-20 ENCOUNTER — Inpatient Hospital Stay (HOSPITAL_COMMUNITY)
Admission: RE | Admit: 2017-09-20 | Discharge: 2017-09-21 | DRG: 470 | Disposition: A | Discharge status: Home Health | Payer: Medicare Other | Source: Ambulatory Visit | Attending: Orthopedic Surgery | Admitting: Orthopedic Surgery

## 2017-09-20 ENCOUNTER — Inpatient Hospital Stay (HOSPITAL_COMMUNITY): Payer: Medicare Other | Admitting: Anesthesiology

## 2017-09-20 ENCOUNTER — Other Ambulatory Visit: Payer: Self-pay

## 2017-09-20 ENCOUNTER — Encounter (HOSPITAL_COMMUNITY)
Admission: RE | Disposition: A | Discharge status: Home Health | Payer: Self-pay | Source: Ambulatory Visit | Attending: Orthopedic Surgery

## 2017-09-20 ENCOUNTER — Encounter (HOSPITAL_COMMUNITY): Payer: Self-pay

## 2017-09-20 ENCOUNTER — Inpatient Hospital Stay (HOSPITAL_COMMUNITY): Payer: Medicare Other

## 2017-09-20 DIAGNOSIS — Z8674 Personal history of sudden cardiac arrest: Secondary | ICD-10-CM

## 2017-09-20 DIAGNOSIS — K219 Gastro-esophageal reflux disease without esophagitis: Secondary | ICD-10-CM | POA: Diagnosis present

## 2017-09-20 DIAGNOSIS — Z79899 Other long term (current) drug therapy: Secondary | ICD-10-CM | POA: Diagnosis not present

## 2017-09-20 DIAGNOSIS — E039 Hypothyroidism, unspecified: Secondary | ICD-10-CM | POA: Diagnosis present

## 2017-09-20 DIAGNOSIS — M1611 Unilateral primary osteoarthritis, right hip: Secondary | ICD-10-CM | POA: Diagnosis present

## 2017-09-20 DIAGNOSIS — Z85828 Personal history of other malignant neoplasm of skin: Secondary | ICD-10-CM

## 2017-09-20 DIAGNOSIS — M169 Osteoarthritis of hip, unspecified: Secondary | ICD-10-CM | POA: Diagnosis present

## 2017-09-20 DIAGNOSIS — G4733 Obstructive sleep apnea (adult) (pediatric): Secondary | ICD-10-CM | POA: Diagnosis present

## 2017-09-20 DIAGNOSIS — E669 Obesity, unspecified: Secondary | ICD-10-CM | POA: Diagnosis present

## 2017-09-20 DIAGNOSIS — F329 Major depressive disorder, single episode, unspecified: Secondary | ICD-10-CM | POA: Diagnosis present

## 2017-09-20 DIAGNOSIS — Z7982 Long term (current) use of aspirin: Secondary | ICD-10-CM

## 2017-09-20 DIAGNOSIS — G2581 Restless legs syndrome: Secondary | ICD-10-CM | POA: Diagnosis present

## 2017-09-20 DIAGNOSIS — Z6833 Body mass index (BMI) 33.0-33.9, adult: Secondary | ICD-10-CM

## 2017-09-20 DIAGNOSIS — F17200 Nicotine dependence, unspecified, uncomplicated: Secondary | ICD-10-CM | POA: Diagnosis present

## 2017-09-20 DIAGNOSIS — E785 Hyperlipidemia, unspecified: Secondary | ICD-10-CM | POA: Diagnosis present

## 2017-09-20 DIAGNOSIS — Z96649 Presence of unspecified artificial hip joint: Secondary | ICD-10-CM

## 2017-09-20 HISTORY — PX: TOTAL HIP ARTHROPLASTY: SHX124

## 2017-09-20 LAB — TYPE AND SCREEN
ABO/RH(D): B NEG
ANTIBODY SCREEN: NEGATIVE

## 2017-09-20 SURGERY — ARTHROPLASTY, HIP, TOTAL, ANTERIOR APPROACH
Anesthesia: Spinal | Site: Hip | Laterality: Right

## 2017-09-20 MED ORDER — BUPIVACAINE IN DEXTROSE 0.75-8.25 % IT SOLN
INTRATHECAL | Status: DC | PRN
  Administered 2017-09-20: 1.6 mL via INTRATHECAL

## 2017-09-20 MED ORDER — OXYCODONE HCL 5 MG PO TABS
5.0000 mg | ORAL_TABLET | ORAL | Status: DC | PRN
  Administered 2017-09-20 – 2017-09-21 (×2): 5 mg via ORAL
  Filled 2017-09-20 (×2): qty 1

## 2017-09-20 MED ORDER — METOCLOPRAMIDE HCL 5 MG/ML IJ SOLN: 5.0000 mg | Freq: Three times a day (TID) | INTRAMUSCULAR | Status: DC | PRN

## 2017-09-20 MED ORDER — BUPIVACAINE HCL (PF) 0.25 % IJ SOLN
INTRAMUSCULAR | Status: DC | PRN
  Administered 2017-09-20: 30 mL

## 2017-09-20 MED ORDER — MORPHINE SULFATE (PF) 4 MG/ML IV SOLN
1.0000 mg | INTRAVENOUS | Status: DC | PRN
  Administered 2017-09-20: 1 mg via INTRAVENOUS
  Filled 2017-09-20: qty 1

## 2017-09-20 MED ORDER — SERTRALINE HCL 100 MG PO TABS
100.0000 mg | ORAL_TABLET | Freq: Every morning | ORAL | Status: DC
  Administered 2017-09-21: 09:00:00 100 mg via ORAL
  Filled 2017-09-20: qty 1

## 2017-09-20 MED ORDER — METOCLOPRAMIDE HCL 5 MG PO TABS: 5.0000 mg | ORAL_TABLET | Freq: Three times a day (TID) | ORAL | Status: DC | PRN

## 2017-09-20 MED ORDER — FENTANYL CITRATE (PF) 100 MCG/2ML IJ SOLN: 25.0000 ug | INTRAMUSCULAR | Status: DC | PRN

## 2017-09-20 MED ORDER — ONDANSETRON HCL 4 MG PO TABS: 4.0000 mg | ORAL_TABLET | Freq: Four times a day (QID) | ORAL | Status: DC | PRN

## 2017-09-20 MED ORDER — TRAMADOL HCL 50 MG PO TABS: 50.0000 mg | ORAL_TABLET | Freq: Four times a day (QID) | ORAL | 0 refills | Status: DC | PRN

## 2017-09-20 MED ORDER — ROPINIROLE HCL 0.5 MG PO TABS
0.5000 mg | ORAL_TABLET | Freq: Every day | ORAL | Status: DC
  Administered 2017-09-20: 0.5 mg via ORAL
  Filled 2017-09-20: qty 1

## 2017-09-20 MED ORDER — METHOCARBAMOL 1000 MG/10ML IJ SOLN
500.0000 mg | Freq: Four times a day (QID) | INTRAVENOUS | Status: DC | PRN
  Filled 2017-09-20: qty 5

## 2017-09-20 MED ORDER — EPHEDRINE SULFATE 50 MG/ML IJ SOLN
INTRAMUSCULAR | Status: DC | PRN
  Administered 2017-09-20: 10 mg via INTRAVENOUS

## 2017-09-20 MED ORDER — MEPERIDINE HCL 50 MG/ML IJ SOLN: 6.2500 mg | INTRAMUSCULAR | Status: DC | PRN

## 2017-09-20 MED ORDER — LACTATED RINGERS IV SOLN
INTRAVENOUS | Status: DC
  Administered 2017-09-20: 1000 mL via INTRAVENOUS
  Administered 2017-09-20: 09:00:00 via INTRAVENOUS

## 2017-09-20 MED ORDER — METHOCARBAMOL 500 MG PO TABS
500.0000 mg | ORAL_TABLET | Freq: Four times a day (QID) | ORAL | Status: DC | PRN
  Administered 2017-09-20 – 2017-09-21 (×3): 500 mg via ORAL
  Filled 2017-09-20 (×3): qty 1

## 2017-09-20 MED ORDER — PROPOFOL 500 MG/50ML IV EMUL
INTRAVENOUS | Status: DC | PRN
  Administered 2017-09-20: 50 ug/kg/min via INTRAVENOUS

## 2017-09-20 MED ORDER — SUMATRIPTAN SUCCINATE 50 MG PO TABS
100.0000 mg | ORAL_TABLET | ORAL | Status: DC | PRN
  Filled 2017-09-20: qty 2

## 2017-09-20 MED ORDER — ACETAMINOPHEN 500 MG PO TABS
1000.0000 mg | ORAL_TABLET | Freq: Four times a day (QID) | ORAL | Status: AC
  Administered 2017-09-20 – 2017-09-21 (×4): 1000 mg via ORAL
  Filled 2017-09-20 (×4): qty 2

## 2017-09-20 MED ORDER — CEFAZOLIN SODIUM-DEXTROSE 2-4 GM/100ML-% IV SOLN
2.0000 g | Freq: Four times a day (QID) | INTRAVENOUS | Status: AC
  Administered 2017-09-20 (×2): 2 g via INTRAVENOUS
  Filled 2017-09-20 (×2): qty 100

## 2017-09-20 MED ORDER — CEFAZOLIN SODIUM-DEXTROSE 2-4 GM/100ML-% IV SOLN
INTRAVENOUS | Status: AC
  Filled 2017-09-20: qty 100

## 2017-09-20 MED ORDER — BISACODYL 10 MG RE SUPP: 10.0000 mg | Freq: Every day | RECTAL | Status: DC | PRN

## 2017-09-20 MED ORDER — PROPOFOL 10 MG/ML IV BOLUS
INTRAVENOUS | Status: AC
  Filled 2017-09-20: qty 40

## 2017-09-20 MED ORDER — POTASSIUM CHLORIDE CRYS ER 20 MEQ PO TBCR
20.0000 meq | EXTENDED_RELEASE_TABLET | Freq: Every day | ORAL | Status: DC
  Administered 2017-09-21: 09:00:00 20 meq via ORAL
  Filled 2017-09-20: qty 1

## 2017-09-20 MED ORDER — METHOCARBAMOL 500 MG PO TABS: 500.0000 mg | ORAL_TABLET | Freq: Four times a day (QID) | ORAL | 0 refills | Status: DC | PRN

## 2017-09-20 MED ORDER — ONDANSETRON HCL 4 MG/2ML IJ SOLN
INTRAMUSCULAR | Status: DC | PRN
  Administered 2017-09-20: 4 mg via INTRAVENOUS

## 2017-09-20 MED ORDER — TEMAZEPAM 15 MG PO CAPS: 30.0000 mg | ORAL_CAPSULE | Freq: Every evening | ORAL | Status: DC | PRN

## 2017-09-20 MED ORDER — 0.9 % SODIUM CHLORIDE (POUR BTL) OPTIME
TOPICAL | Status: DC | PRN
  Administered 2017-09-20: 1000 mL

## 2017-09-20 MED ORDER — OXYCODONE HCL 5 MG PO TABS: 5.0000 mg | ORAL_TABLET | ORAL | 0 refills | Status: DC | PRN

## 2017-09-20 MED ORDER — CHLORHEXIDINE GLUCONATE 4 % EX LIQD: 60.0000 mL | Freq: Once | CUTANEOUS | Status: DC

## 2017-09-20 MED ORDER — LEVOTHYROXINE SODIUM 125 MCG PO TABS
125.0000 ug | ORAL_TABLET | Freq: Every day | ORAL | Status: DC
  Administered 2017-09-21: 125 ug via ORAL
  Filled 2017-09-20: qty 1

## 2017-09-20 MED ORDER — ACETAMINOPHEN 160 MG/5ML PO SOLN: 325.0000 mg | ORAL | Status: DC | PRN

## 2017-09-20 MED ORDER — ONDANSETRON HCL 4 MG/2ML IJ SOLN: 4.0000 mg | Freq: Four times a day (QID) | INTRAMUSCULAR | Status: DC | PRN

## 2017-09-20 MED ORDER — PHENYLEPHRINE HCL 10 MG/ML IJ SOLN
INTRAMUSCULAR | Status: AC
  Filled 2017-09-20: qty 1

## 2017-09-20 MED ORDER — DEXAMETHASONE SODIUM PHOSPHATE 10 MG/ML IJ SOLN
10.0000 mg | Freq: Once | INTRAMUSCULAR | Status: AC
  Administered 2017-09-20: 10 mg via INTRAVENOUS

## 2017-09-20 MED ORDER — ACETAMINOPHEN 10 MG/ML IV SOLN
INTRAVENOUS | Status: AC
  Filled 2017-09-20: qty 100

## 2017-09-20 MED ORDER — TRANEXAMIC ACID 1000 MG/10ML IV SOLN
1000.0000 mg | Freq: Once | INTRAVENOUS | Status: AC
  Administered 2017-09-20: 1000 mg via INTRAVENOUS
  Filled 2017-09-20: qty 1100

## 2017-09-20 MED ORDER — TRAMADOL HCL 50 MG PO TABS: 50.0000 mg | ORAL_TABLET | Freq: Four times a day (QID) | ORAL | Status: DC | PRN

## 2017-09-20 MED ORDER — PHENOL 1.4 % MT LIQD
1.0000 | OROMUCOSAL | Status: DC | PRN
  Filled 2017-09-20: qty 177

## 2017-09-20 MED ORDER — TRANEXAMIC ACID 1000 MG/10ML IV SOLN
1000.0000 mg | INTRAVENOUS | Status: AC
  Administered 2017-09-20: 1000 mg via INTRAVENOUS
  Filled 2017-09-20: qty 1100

## 2017-09-20 MED ORDER — ONDANSETRON HCL 4 MG/2ML IJ SOLN: 4.0000 mg | Freq: Once | INTRAMUSCULAR | Status: DC | PRN

## 2017-09-20 MED ORDER — MIDAZOLAM HCL 5 MG/5ML IJ SOLN
INTRAMUSCULAR | Status: DC | PRN
  Administered 2017-09-20 (×2): 1 mg via INTRAVENOUS

## 2017-09-20 MED ORDER — BUDESONIDE 0.25 MG/2ML IN SUSP: 0.2500 mg | Freq: Two times a day (BID) | RESPIRATORY_TRACT | Status: DC | PRN

## 2017-09-20 MED ORDER — DARIFENACIN HYDROBROMIDE ER 15 MG PO TB24
15.0000 mg | ORAL_TABLET | Freq: Every day | ORAL | Status: DC
  Administered 2017-09-21: 15 mg via ORAL
  Filled 2017-09-20: qty 1

## 2017-09-20 MED ORDER — DIPHENHYDRAMINE HCL 12.5 MG/5ML PO ELIX: 12.5000 mg | ORAL_SOLUTION | ORAL | Status: DC | PRN

## 2017-09-20 MED ORDER — CEFAZOLIN SODIUM-DEXTROSE 2-4 GM/100ML-% IV SOLN
2.0000 g | INTRAVENOUS | Status: AC
  Administered 2017-09-20: 2 g via INTRAVENOUS

## 2017-09-20 MED ORDER — MENTHOL 3 MG MT LOZG: 1.0000 | LOZENGE | OROMUCOSAL | Status: DC | PRN

## 2017-09-20 MED ORDER — SODIUM CHLORIDE 0.9 % IV SOLN
INTRAVENOUS | Status: DC
  Administered 2017-09-20: 12:00:00 via INTRAVENOUS

## 2017-09-20 MED ORDER — DOCUSATE SODIUM 100 MG PO CAPS
100.0000 mg | ORAL_CAPSULE | Freq: Two times a day (BID) | ORAL | Status: DC
  Administered 2017-09-20 – 2017-09-21 (×2): 100 mg via ORAL
  Filled 2017-09-20 (×2): qty 1

## 2017-09-20 MED ORDER — PHENYLEPHRINE HCL 10 MG/ML IJ SOLN
INTRAVENOUS | Status: DC | PRN
  Administered 2017-09-20: 50 ug/min via INTRAVENOUS

## 2017-09-20 MED ORDER — KETOROLAC TROMETHAMINE 30 MG/ML IJ SOLN: 30.0000 mg | Freq: Once | INTRAMUSCULAR | Status: DC | PRN

## 2017-09-20 MED ORDER — ACETAMINOPHEN 325 MG PO TABS: 650.0000 mg | ORAL_TABLET | ORAL | Status: DC | PRN

## 2017-09-20 MED ORDER — OXYCODONE HCL 5 MG PO TABS: 5.0000 mg | ORAL_TABLET | Freq: Once | ORAL | Status: DC | PRN

## 2017-09-20 MED ORDER — OXYCODONE HCL 5 MG/5ML PO SOLN
5.0000 mg | Freq: Once | ORAL | Status: DC | PRN
  Filled 2017-09-20: qty 5

## 2017-09-20 MED ORDER — ACETAMINOPHEN 10 MG/ML IV SOLN
1000.0000 mg | Freq: Once | INTRAVENOUS | Status: AC
  Administered 2017-09-20: 1000 mg via INTRAVENOUS

## 2017-09-20 MED ORDER — ATENOLOL 25 MG PO TABS
25.0000 mg | ORAL_TABLET | Freq: Every morning | ORAL | Status: DC
  Administered 2017-09-21: 25 mg via ORAL
  Filled 2017-09-20: qty 1

## 2017-09-20 MED ORDER — DEXAMETHASONE SODIUM PHOSPHATE 10 MG/ML IJ SOLN
10.0000 mg | Freq: Once | INTRAMUSCULAR | Status: AC
  Administered 2017-09-21: 10 mg via INTRAVENOUS
  Filled 2017-09-20: qty 1

## 2017-09-20 MED ORDER — ACETAMINOPHEN 650 MG RE SUPP: 650.0000 mg | RECTAL | Status: DC | PRN

## 2017-09-20 MED ORDER — LACTATED RINGERS IV SOLN: INTRAVENOUS | Status: DC

## 2017-09-20 MED ORDER — MONTELUKAST SODIUM 10 MG PO TABS
10.0000 mg | ORAL_TABLET | Freq: Every day | ORAL | Status: DC
  Administered 2017-09-20: 10 mg via ORAL
  Filled 2017-09-20: qty 1

## 2017-09-20 MED ORDER — BUPIVACAINE-EPINEPHRINE (PF) 0.5% -1:200000 IJ SOLN
INTRAMUSCULAR | Status: AC
  Filled 2017-09-20: qty 30

## 2017-09-20 MED ORDER — FLEET ENEMA 7-19 GM/118ML RE ENEM: 1.0000 | ENEMA | Freq: Once | RECTAL | Status: DC | PRN

## 2017-09-20 MED ORDER — ALBUTEROL SULFATE (2.5 MG/3ML) 0.083% IN NEBU: 3.0000 mL | INHALATION_SOLUTION | Freq: Four times a day (QID) | RESPIRATORY_TRACT | Status: DC | PRN

## 2017-09-20 MED ORDER — STERILE WATER FOR IRRIGATION IR SOLN
Status: DC | PRN
  Administered 2017-09-20: 2000 mL

## 2017-09-20 MED ORDER — PHENYLEPHRINE HCL 10 MG/ML IJ SOLN
INTRAMUSCULAR | Status: DC | PRN
  Administered 2017-09-20 (×2): 80 ug via INTRAVENOUS

## 2017-09-20 MED ORDER — BUPIVACAINE HCL 0.25 % IJ SOLN
INTRAMUSCULAR | Status: AC
  Filled 2017-09-20: qty 1

## 2017-09-20 MED ORDER — POLYETHYLENE GLYCOL 3350 17 G PO PACK: 17.0000 g | PACK | Freq: Every day | ORAL | Status: DC | PRN

## 2017-09-20 MED ORDER — MIDAZOLAM HCL 2 MG/2ML IJ SOLN
INTRAMUSCULAR | Status: AC
  Filled 2017-09-20: qty 2

## 2017-09-20 MED ORDER — PHENYLEPHRINE 40 MCG/ML (10ML) SYRINGE FOR IV PUSH (FOR BLOOD PRESSURE SUPPORT)
PREFILLED_SYRINGE | INTRAVENOUS | Status: AC
  Filled 2017-09-20: qty 10

## 2017-09-20 MED ORDER — LORAZEPAM 1 MG PO TABS: 2.0000 mg | ORAL_TABLET | Freq: Two times a day (BID) | ORAL | Status: DC | PRN

## 2017-09-20 MED ORDER — RIVAROXABAN 10 MG PO TABS: 10.0000 mg | ORAL_TABLET | Freq: Every day | ORAL | 0 refills | Status: DC

## 2017-09-20 MED ORDER — ACETAMINOPHEN 325 MG PO TABS: 325.0000 mg | ORAL_TABLET | ORAL | Status: DC | PRN

## 2017-09-20 MED ORDER — EPHEDRINE 5 MG/ML INJ
INTRAVENOUS | Status: AC
  Filled 2017-09-20: qty 10

## 2017-09-20 MED ORDER — OXYCODONE HCL 5 MG PO TABS
10.0000 mg | ORAL_TABLET | ORAL | Status: DC | PRN
  Administered 2017-09-20 – 2017-09-21 (×4): 10 mg via ORAL
  Filled 2017-09-20 (×4): qty 2

## 2017-09-20 MED ORDER — RIVAROXABAN 10 MG PO TABS
10.0000 mg | ORAL_TABLET | Freq: Every day | ORAL | Status: DC
  Administered 2017-09-21: 10 mg via ORAL
  Filled 2017-09-20: qty 1

## 2017-09-20 SURGICAL SUPPLY — 34 items
BAG SPEC THK2 15X12 ZIP CLS (MISCELLANEOUS)
BAG ZIPLOCK 12X15 (MISCELLANEOUS) IMPLANT
BLADE SAG 18X100X1.27 (BLADE) ×3 IMPLANT
CAPT HIP TOTAL 2 ×2 IMPLANT
CLOSURE WOUND 1/2 X4 (GAUZE/BANDAGES/DRESSINGS) ×2
CLOTH BEACON ORANGE TIMEOUT ST (SAFETY) ×3 IMPLANT
COVER PERINEAL POST (MISCELLANEOUS) ×3 IMPLANT
COVER SURGICAL LIGHT HANDLE (MISCELLANEOUS) ×3 IMPLANT
DECANTER SPIKE VIAL GLASS SM (MISCELLANEOUS) ×3 IMPLANT
DRAPE STERI IOBAN 125X83 (DRAPES) ×3 IMPLANT
DRAPE U-SHAPE 47X51 STRL (DRAPES) ×6 IMPLANT
DRSG ADAPTIC 3X8 NADH LF (GAUZE/BANDAGES/DRESSINGS) ×3 IMPLANT
DRSG MEPILEX BORDER 4X4 (GAUZE/BANDAGES/DRESSINGS) ×3 IMPLANT
DRSG MEPILEX BORDER 4X8 (GAUZE/BANDAGES/DRESSINGS) ×3 IMPLANT
DURAPREP 26ML APPLICATOR (WOUND CARE) ×3 IMPLANT
ELECT REM PT RETURN 15FT ADLT (MISCELLANEOUS) ×3 IMPLANT
EVACUATOR 1/8 PVC DRAIN (DRAIN) ×3 IMPLANT
GLOVE BIO SURGEON STRL SZ7.5 (GLOVE) ×3 IMPLANT
GLOVE BIO SURGEON STRL SZ8 (GLOVE) ×6 IMPLANT
GLOVE BIOGEL PI IND STRL 8 (GLOVE) ×2 IMPLANT
GLOVE BIOGEL PI INDICATOR 8 (GLOVE) ×4
GOWN STRL REUS W/TWL LRG LVL3 (GOWN DISPOSABLE) ×3 IMPLANT
GOWN STRL REUS W/TWL XL LVL3 (GOWN DISPOSABLE) ×3 IMPLANT
PACK ANTERIOR HIP CUSTOM (KITS) ×3 IMPLANT
STRIP CLOSURE SKIN 1/2X4 (GAUZE/BANDAGES/DRESSINGS) ×3 IMPLANT
SUT ETHIBOND NAB CT1 #1 30IN (SUTURE) ×3 IMPLANT
SUT MNCRL AB 4-0 PS2 18 (SUTURE) ×3 IMPLANT
SUT STRATAFIX 0 PDS 27 VIOLET (SUTURE) ×3
SUT VIC AB 2-0 CT1 27 (SUTURE) ×9
SUT VIC AB 2-0 CT1 TAPERPNT 27 (SUTURE) ×2 IMPLANT
SUTURE STRATFX 0 PDS 27 VIOLET (SUTURE) ×1 IMPLANT
SYR 50ML LL SCALE MARK (SYRINGE) IMPLANT
TRAY FOLEY CATH 14FR (SET/KITS/TRAYS/PACK) ×2 IMPLANT
YANKAUER SUCT BULB TIP 10FT TU (MISCELLANEOUS) ×3 IMPLANT

## 2017-09-20 NOTE — Anesthesia Procedure Notes (Signed)
Spinal  Patient location during procedure: OR Start time: 09/20/2017 8:17 AM End time: 09/20/2017 8:23 AM Staffing Anesthesiologist: Janeece Riggers, MD Resident/CRNA: Glory Buff, CRNA Performed: resident/CRNA  Preanesthetic Checklist Completed: patient identified, site marked, surgical consent, pre-op evaluation, timeout performed, IV checked, risks and benefits discussed and monitors and equipment checked Spinal Block Patient position: sitting Prep: ChloraPrep Patient monitoring: heart rate, continuous pulse ox and blood pressure Approach: midline Location: L2-3 Injection technique: single-shot Needle Needle type: Pencan  Needle gauge: 24 G Needle length: 9 cm Needle insertion depth: 6 cm Assessment Sensory level: T6 Additional Notes Kit expiration date checked and verified.  Stick x 2, -heme, -paraesthesia, +CSF pre and post injection, patient tolerated well, sensory level T6.

## 2017-09-20 NOTE — Discharge Instructions (Addendum)
° °Dr. Frank [name] °Total Joint Specialist °[facility] °[address] °[place], [place] [address] °[phone] ° °ANTERIOR APPROACH TOTAL HIP REPLACEMENT POSTOPERATIVE DIRECTIONS ° ° °Hip Rehabilitation, Guidelines Following Surgery  °The results of a hip operation are greatly improved after range of motion and muscle strengthening exercises. Follow all safety measures which are given to protect your hip. If any of these exercises cause increased pain or swelling in your joint, decrease the amount until you are comfortable again. Then slowly increase the exercises. Call your caregiver if you have problems or questions.  ° °HOME CARE INSTRUCTIONS  °Remove items at home which could result in a fall. This includes throw rugs or furniture in walking pathways.  °· ICE to the affected hip every three hours for 30 minutes at a time and then as needed for pain and swelling.  Continue to use ice on the hip for pain and swelling from surgery. You may notice swelling that will progress down to the foot and ankle.  This is normal after surgery.  Elevate the leg when you are not up walking on it.   °· Continue to use the breathing machine which will help keep your temperature down.  It is common for your temperature to cycle up and down following surgery, especially at night when you are not up moving around and exerting yourself.  The breathing machine keeps your lungs expanded and your temperature down. ° ° °DIET °You may resume your previous home diet once your are discharged from the hospital. ° °DRESSING / WOUND CARE / SHOWERING °You may shower 3 days after surgery, but keep the wounds dry during showering.  You may use an occlusive plastic wrap (Press'n Seal for example), NO SOAKING/SUBMERGING IN THE BATHTUB.  If the bandage gets wet, change with a clean dry gauze.  If the incision gets wet, pat the wound dry with a clean towel. °You may start showering once you are discharged home but do not  submerge the incision under water. Just pat the incision dry and apply a dry gauze dressing on daily. °Change the surgical dressing daily and reapply a dry dressing each time. ° °ACTIVITY °Walk with your walker as instructed. °Use walker as long as suggested by your caregivers. °Avoid periods of inactivity such as sitting longer than an hour when not asleep. This helps prevent blood clots.  °You may resume a sexual relationship in one month or when given the OK by your doctor.  °You may return to work once you are cleared by your doctor.  °Do not drive a car for 6 weeks or until released by you surgeon.  °Do not drive while taking narcotics. ° °WEIGHT BEARING °Weight bearing as tolerated with assist device (walker, cane, etc) as directed, use it as long as suggested by your surgeon or therapist, typically at least 4-6 weeks. ° °POSTOPERATIVE CONSTIPATION PROTOCOL °Constipation - defined medically as fewer than three stools per week and severe constipation as less than one stool per week. ° °One of the most common issues patients have following surgery is constipation.  Even if you have a regular bowel pattern at home, your normal regimen is likely to be disrupted due to multiple reasons following surgery.  Combination of anesthesia, postoperative narcotics, change in appetite and fluid intake all can affect your bowels.  In order to avoid complications following surgery, here are some recommendations in order to help you during your recovery period. ° °Colace (docusate) - Pick up an over-the-counter   form of Colace or another stool softener and take twice a day as long as you are requiring postoperative pain medications.  Take with a full glass of water daily.  If you experience loose stools or diarrhea, hold the colace until you stool forms back up.  If your symptoms do not get better within 1 week or if they get worse, check with your doctor. ° °Dulcolax (bisacodyl) - Pick up over-the-counter and take as directed  by the product packaging as needed to assist with the movement of your bowels.  Take with a full glass of water.  Use this product as needed if not relieved by Colace only.  ° °MiraLax (polyethylene glycol) - Pick up over-the-counter to have on hand.  MiraLax is a solution that will increase the amount of water in your bowels to assist with bowel movements.  Take as directed and can mix with a glass of water, juice, soda, coffee, or tea.  Take if you go more than two days without a movement. °Do not use MiraLax more than once per day. Call your doctor if you are still constipated or irregular after using this medication for 7 days in a row. ° °If you continue to have problems with postoperative constipation, please contact the office for further assistance and recommendations.  If you experience "the worst abdominal pain ever" or develop nausea or vomiting, please contact the office immediatly for further recommendations for treatment. ° °ITCHING ° If you experience itching with your medications, try taking only a single pain pill, or even half a pain pill at a time.  You can also use Benadryl over the counter for itching or also to help with sleep.  ° °TED HOSE STOCKINGS °Wear the elastic stockings on both legs for three weeks following surgery during the day but you may remove then at night for sleeping. ° °MEDICATIONS °See your medication summary on the “After Visit Summary” that the nursing staff will review with you prior to discharge.  You may have some home medications which will be placed on hold until you complete the course of blood thinner medication.  It is important for you to complete the blood thinner medication as prescribed by your surgeon.  Continue your approved medications as instructed at time of discharge. ° °PRECAUTIONS °If you experience chest pain or shortness of breath - call 911 immediately for transfer to the hospital emergency department.  °If you develop a fever greater that 101 F,  purulent drainage from wound, increased redness or drainage from wound, foul odor from the wound/dressing, or calf pain - CONTACT YOUR SURGEON.   °                                                °FOLLOW-UP APPOINTMENTS °Make sure you keep all of your appointments after your operation with your surgeon and caregivers. You should call the office at the above phone number and make an appointment for approximately two weeks after the date of your surgery or on the date instructed by your surgeon outlined in the "After Visit Summary". ° °RANGE OF MOTION AND STRENGTHENING EXERCISES  °These exercises are designed to help you keep full movement of your hip joint. Follow your caregiver's or physical therapist's instructions. Perform all exercises about fifteen times, three times per day or as directed. Exercise both hips, even if you   have had only one joint replacement. These exercises can be done on a training (exercise) mat, on the floor, on a table or on a bed. Use whatever works the best and is most comfortable for you. Use music or television while you are exercising so that the exercises are a pleasant break in your day. This will make your life better with the exercises acting as a break in routine you can look forward to.  Lying on your back, slowly slide your foot toward your buttocks, raising your knee up off the floor. Then slowly slide your foot back down until your leg is straight again.  Lying on your back spread your legs as far apart as you can without causing discomfort.  Lying on your side, raise your upper leg and foot straight up from the floor as far as is comfortable. Slowly lower the leg and repeat.  Lying on your back, tighten up the muscle in the front of your thigh (quadriceps muscles). You can do this by keeping your leg straight and trying to raise your heel off the floor. This helps strengthen the largest muscle supporting your knee.  Lying on your back, tighten up the muscles of your  buttocks both with the legs straight and with the knee bent at a comfortable angle while keeping your heel on the floor.   IF YOU ARE TRANSFERRED TO A SKILLED REHAB FACILITY If the patient is transferred to a skilled rehab facility following release from the hospital, a list of the current medications will be sent to the facility for the patient to continue.  When discharged from the skilled rehab facility, please have the facility set up the patient's [place] prior to being released. Also, the skilled facility will be responsible for providing the patient with their medications at time of release from the facility to include their pain medication, the muscle relaxants, and their blood thinner medication. If the patient is still at the rehab facility at time of the two week follow up appointment, the skilled rehab facility will also need to assist the patient in arranging follow up appointment in our office and any transportation needs.  MAKE SURE YOU:  Understand these instructions.  Get help right away if you are not doing well or get worse.    Pick up stool softner and laxative for home use following surgery while on pain medications. Do not submerge incision under water. Please use good hand washing techniques while changing dressing each day. May shower starting three days after surgery. Please use a clean towel to pat the incision dry following showers. Continue to use ice for pain and swelling after surgery. Do not use any lotions or creams on the incision until instructed by your surgeon.  Take Xarelto for two and a half more weeks following discharge from the hospital, then discontinue Xarelto. Once the patient has completed the Xarelto, they may resume the 325 mg Aspirin.    Information on my medicine - XARELTO (Rivaroxaban) Why was Xarelto prescribed for you? Xarelto was prescribed for you to reduce the risk of blood clots forming after orthopedic surgery.  The medical term for these abnormal blood clots is venous thromboembolism (VTE).  What do you need to know about xarelto ? Take your Xarelto ONCE DAILY at the same time every day. You may take it either with or without food.  If you have difficulty swallowing the tablet whole, you may crush it and mix in applesauce just prior to  taking your dose.  Take Xarelto exactly as prescribed by your doctor and DO NOT stop taking Xarelto without talking to the doctor who prescribed the medication.  Stopping without other VTE prevention medication to take the place of Xarelto may increase your risk of developing a clot.  After discharge, you should have regular check-up appointments with your healthcare provider that is prescribing your Xarelto.    What do you do if you miss a dose? If you miss a dose, take it as soon as you remember on the same day then continue your regularly scheduled once daily regimen the next day. Do not take two doses of Xarelto on the same day.   Important Safety Information A possible side effect of Xarelto is bleeding. You should call your healthcare provider right away if you experience any of the following: ? Bleeding from an injury or your nose that does not stop. ? Unusual colored urine (red or dark brown) or unusual colored stools (red or Worst). ? Unusual bruising for unknown reasons. ? A serious fall or if you hit your head (even if there is no bleeding).  Some medicines may interact with Xarelto and might increase your risk of bleeding while on Xarelto. To help avoid this, consult your healthcare provider or pharmacist prior to using any new prescription or non-prescription medications, including herbals, vitamins, non-steroidal anti-inflammatory drugs (NSAIDs) and supplements.  This website has more information on Xarelto: [email or web address]

## 2017-09-20 NOTE — Anesthesia Postprocedure Evaluation (Signed)
Anesthesia Post Note  Patient: [name]  Procedure(s) Performed: RIGHT TOTAL HIP ARTHROPLASTY ANTERIOR APPROACH (Right Hip)     Patient location during evaluation: PACU Anesthesia Type: Spinal Level of consciousness: oriented and awake and alert Pain management: pain level controlled Vital Signs Assessment: post-procedure vital signs reviewed and stable Respiratory status: spontaneous breathing, respiratory function stable and patient connected to nasal cannula oxygen Cardiovascular status: blood pressure returned to baseline and stable Postop Assessment: no headache, no backache and no apparent nausea or vomiting Anesthetic complications: no    Last Vitals:  Vitals:   09/20/17 0633 09/20/17 1007  BP: (!) 135/55 (!) 123/48  Pulse: 66 60  Resp: 18 16  Temp: 37.2 C 36.4 C  SpO2: 96% 100%    Last Pain:  Vitals:   09/20/17 1007  TempSrc:   PainSc: 0-No pain                 Latria Mccarron

## 2017-09-20 NOTE — Op Note (Signed)
OPERATIVE REPORT- TOTAL HIP ARTHROPLASTY   PREOPERATIVE DIAGNOSIS: Osteoarthritis of the Right hip.   POSTOPERATIVE DIAGNOSIS: Osteoarthritis of the Right  hip.   PROCEDURE: Right total hip arthroplasty, anterior approach.   SURGEON: Gaynelle Arabian, MD   ASSISTANT: Arlee Muslim, PA-C  ANESTHESIA:  Spinal  ESTIMATED BLOOD LOSS:-400 mL    DRAINS: Hemovac x1.   COMPLICATIONS: None   CONDITION: PACU - hemodynamically stable.   BRIEF CLINICAL NOTE: [name] is a 67 y.o. female who has advanced end-  stage arthritis of their Right  hip with progressively worsening pain and  dysfunction.The patient has failed nonoperative management and presents for  total hip arthroplasty.   PROCEDURE IN DETAIL: After successful administration of spinal  anesthetic, the traction boots for the [facility] bed were placed on both  feet and the patient was placed onto the [facility] bed, boots placed into the leg  holders. The Right hip was then isolated from the perineum with plastic  drapes and prepped and draped in the usual sterile fashion. ASIS and  greater trochanter were marked and a oblique incision was made, starting  at about 1 cm lateral and 2 cm distal to the ASIS and coursing towards  the anterior cortex of the femur. The skin was cut with a 10 blade  through subcutaneous tissue to the level of the fascia overlying the  tensor fascia lata muscle. The fascia was then incised in line with the  incision at the junction of the anterior third and posterior 2/3rd. The  muscle was teased off the fascia and then the interval between the TFL  and the rectus was developed. The Hohmann retractor was then placed at  the top of the femoral neck over the capsule. The vessels overlying the  capsule were cauterized and the fat on top of the capsule was removed.  A Hohmann retractor was then placed anterior underneath the rectus  femoris to give exposure to the entire anterior capsule. A T-shaped   capsulotomy was performed. The edges were tagged and the femoral head  was identified.       Osteophytes are removed off the superior acetabulum.  The femoral neck was then cut in situ with an oscillating saw. Traction  was then applied to the left lower extremity utilizing the [facility]  traction. The femoral head was then removed. Retractors were placed  around the acetabulum and then circumferential removal of the labrum was  performed. Osteophytes were also removed. Reaming starts at 45 mm to  medialize and  Increased in 2 mm increments to 49 mm. We reamed in  approximately 40 degrees of abduction, 20 degrees anteversion. A 50 mm  pinnacle acetabular shell was then impacted in anatomic position under  fluoroscopic guidance with excellent purchase. We did not need to place  any additional dome screws. A 32 mm neutral + 4 marathon liner was then  placed into the acetabular shell.       The femoral lift was then placed along the lateral aspect of the femur  just distal to the vastus ridge. The leg was  externally rotated and capsule  was stripped off the inferior aspect of the femoral neck down to the  level of the lesser trochanter, this was done with electrocautery. The femur was lifted after this was performed. The  leg was then placed in an extended and adducted position essentially delivering the femur. We also removed the capsule superiorly and the piriformis from the piriformis  fossa to gain excellent exposure of the  proximal femur. Rongeur was used to remove some cancellous bone to get  into the lateral portion of the proximal femur for placement of the  initial starter reamer. The starter broaches was placed  the starter broach  and was shown to go down the center of the canal. Broaching  with the  Corail system was then performed starting at size 8, coursing  Up to size 9. A size 9 had excellent torsional and rotational  and axial stability. The trial high offset neck was then placed   with a 32 + 1 trial head. The hip was then reduced. We confirmed that  the stem was in the canal both on AP and lateral x-rays. It also has excellent sizing. The hip was reduced with outstanding stability through full extension and full external rotation.. AP pelvis was taken and the leg lengths were measured and found to be equal. Hip was then dislocated again and the femoral head and neck removed. The  femoral broach was removed. Size 9 Corail stem with a high offset  neck was then impacted into the femur following native anteversion. Has  excellent purchase in the canal. Excellent torsional and rotational and  axial stability. It is confirmed to be in the canal on AP and lateral  fluoroscopic views. The 32 + 1 ceramic head was placed and the hip  reduced with outstanding stability. Again AP pelvis was taken and it  confirmed that the leg lengths were equal. The wound was then copiously  irrigated with saline solution and the capsule reattached and repaired  with Ethibond suture. 30 ml of .25% Bupivicaine was  injected into the capsule and into the edge of the tensor fascia lata as well as subcutaneous tissue. The fascia overlying the tensor fascia lata was then closed with a running #1 V-Loc. Subcu was closed with interrupted 2-0 Vicryl and subcuticular running 4-0 Monocryl. Incision was cleaned  and dried. Steri-Strips and a bulky sterile dressing applied. Hemovac  drain was hooked to suction and then the patient was awakened and transported to  recovery in stable condition.        Please note that a surgical assistant was a medical necessity for this procedure to perform it in a safe and expeditious manner. Assistant was necessary to provide appropriate retraction of vital neurovascular structures and to prevent femoral fracture and allow for anatomic placement of the prosthesis.  Gaynelle Arabian, M.D.

## 2017-09-20 NOTE — Progress Notes (Signed)
Physical Therapy Treatment Patient Details Name: [name] MRN: [id number] DOB: 16-Dec-1950 Today's Date: 09/20/2017    History of Present Illness Pt s/p R THR    PT Comments    Pt c/o fatigue after up in chair for short time and assisted back to bed.  Pt very cooperative and should progress well.   Follow Up Recommendations  DC plan and follow up therapy as arranged by surgeon     Equipment Recommendations  Other (comment)    Recommendations for Other Services       Precautions / Restrictions Precautions Precautions: Fall Restrictions Weight Bearing Restrictions: No Other Position/Activity Restrictions: WBAT    Mobility  Bed Mobility Overal bed mobility: Needs Assistance Bed Mobility: Sit to Supine     Supine to sit: Min assist Sit to supine: Min assist   General bed mobility comments: Increased time with cues for sequence and use of L LE to self assist  Transfers Overall transfer level: Needs assistance Equipment used: Rolling walker (2 wheeled) Transfers: Sit to/from Stand Sit to Stand: Min assist         General transfer comment: cues for LE management and use of UEs to self assist  Ambulation/Gait Ambulation/Gait assistance: Min assist Ambulation Distance (Feet): 5 Feet Assistive device: Rolling walker (2 wheeled) Gait Pattern/deviations: Step-to pattern;Decreased step length - right;Decreased step length - left;Shuffle;Trunk flexed Gait velocity: decr Gait velocity interpretation: Below normal speed for age/gender General Gait Details: cues for posture, position from RW and initial sequence   Stairs            Wheelchair Mobility    Modified Rankin (Stroke Patients Only)       Balance                                            Cognition Arousal/Alertness: Awake/alert Behavior During Therapy: WFL for tasks assessed/performed Overall Cognitive Status: Within Functional Limits for tasks assessed                                         Exercises Total Joint Exercises Ankle Circles/Pumps: AROM;Both;15 reps;Supine    General Comments        Pertinent Vitals/Pain Pain Assessment: 0-10 Pain Score: 3  Pain Location: R hip Pain Descriptors / Indicators: Aching;Sore Pain Intervention(s): Limited activity within patient's tolerance;Monitored during session;Premedicated before session;Ice applied    Home Living Family/patient expects to be discharged to:: Private residence Living Arrangements: Spouse/significant other Available Help at Discharge: Family Type of Home: House Home Access: Stairs to enter Entrance Stairs-Rails: Right Home Layout: One level        Prior Function Level of Independence: Independent with assistive device(s)      Comments: Using 4 wh RW   PT Goals (current goals can now be found in the care plan section) Acute Rehab PT Goals Patient Stated Goal: HOME PT Goal Formulation: With patient Time For Goal Achievement: 09/27/17 Potential to Achieve Goals: Good Progress towards PT goals: Progressing toward goals    Frequency    7X/week      PT Plan Current plan remains appropriate    Co-evaluation              AM-PAC PT "6 Clicks" Daily Activity  Outcome Measure  Difficulty turning over  in bed (including adjusting bedclothes, sheets and blankets)?: Unable Difficulty moving from lying on back to sitting on the side of the bed? : Unable Difficulty sitting down on and standing up from a chair with arms (e.g., wheelchair, bedside commode, etc,.)?: Unable Help needed moving to and from a bed to chair (including a wheelchair)?: A Little Help needed walking in hospital room?: A Little Help needed climbing 3-5 steps with a railing? : A Lot 6 Click Score: 11    End of Session Equipment Utilized During Treatment: Gait belt Activity Tolerance: Patient tolerated treatment well Patient left: in bed;with call bell/phone within reach;with bed  alarm set;with family/visitor present Nurse Communication: Mobility status PT Visit Diagnosis: Difficulty in walking, not elsewhere classified (R26.2)     Time: 2841-3244 PT Time Calculation (min) (ACUTE ONLY): 9 min  Charges:  $Gait Training: 8-22 mins $Therapeutic Activity: 8-22 mins                    G Codes:       Pg [phone]    Keiandra Sullenger 09/20/2017, 5:06 PM

## 2017-09-20 NOTE — Evaluation (Signed)
Physical Therapy Evaluation Patient Details Name: [name] MRN: [id number] DOB: [birth year] Today's Date: 09/20/2017   History of Present Illness  Pt s/p R THR  Clinical Impression  Pt s/p R THR and presents with decreased R LE strength/ROM and post op pain limiting functional mobility.  Pt should progress to dc home with family assist.    Follow Up Recommendations DC plan and follow up therapy as arranged by surgeon    Equipment Recommendations  Other (comment)(TBD)    Recommendations for Other Services       Precautions / Restrictions Precautions Precautions: Fall Restrictions Weight Bearing Restrictions: No Other Position/Activity Restrictions: WBAT      Mobility  Bed Mobility Overal bed mobility: Needs Assistance Bed Mobility: Supine to Sit     Supine to sit: Min assist     General bed mobility comments: Increased time with cues for sequence and use of L LE to self assist  Transfers Overall transfer level: Needs assistance Equipment used: Rolling walker (2 wheeled) Transfers: Sit to/from Stand Sit to Stand: Min assist         General transfer comment: cues for LE management and use of UEs to self assist  Ambulation/Gait Ambulation/Gait assistance: Min assist Ambulation Distance (Feet): 48 Feet Assistive device: Rolling walker (2 wheeled) Gait Pattern/deviations: Step-to pattern;Decreased step length - right;Decreased step length - left;Shuffle;Trunk flexed Gait velocity: decr Gait velocity interpretation: Below normal speed for age/gender General Gait Details: cues for posture, position from RW and initial sequence  Stairs            Wheelchair Mobility    Modified Rankin (Stroke Patients Only)       Balance                                             Pertinent Vitals/Pain Pain Assessment: 0-10 Pain Score: 2  Pain Location: R hip Pain Descriptors / Indicators: Aching;Sore Pain Intervention(s): Limited  activity within patient's tolerance;Monitored during session;Premedicated before session;Ice applied    Home Living Family/patient expects to be discharged to:: Private residence Living Arrangements: Spouse/significant other Available Help at Discharge: Family Type of Home: House Home Access: Stairs to enter Entrance Stairs-Rails: Right Entrance Stairs-Number of Steps: [place]: One level        Prior Function Level of Independence: Independent with assistive device(s)         Comments: Using 4 wh RW     Hand Dominance        Extremity/Trunk Assessment   Upper Extremity Assessment Upper Extremity Assessment: Overall WFL for tasks assessed    Lower Extremity Assessment Lower Extremity Assessment: RLE deficits/detail    Cervical / Trunk Assessment Cervical / Trunk Assessment: Normal  Communication   Communication: No difficulties  Cognition Arousal/Alertness: Awake/alert Behavior During Therapy: WFL for tasks assessed/performed Overall Cognitive Status: Within Functional Limits for tasks assessed                                        General Comments      Exercises Total Joint Exercises Ankle Circles/Pumps: AROM;Both;15 reps;Supine   Assessment/Plan    PT Assessment Patient needs continued PT services  PT Problem List Decreased strength;Decreased range of motion;Decreased activity tolerance;Decreased mobility;Decreased knowledge of use of DME;Pain;Obesity  PT Treatment Interventions DME instruction;Gait training;Stair training;Functional mobility training;Therapeutic activities;Therapeutic exercise;Patient/family education    PT Goals (Current goals can be found in the Care Plan section)  Acute Rehab PT Goals Patient Stated Goal: HOME PT Goal Formulation: With patient Time For Goal Achievement: 09/27/17 Potential to Achieve Goals: Good    Frequency 7X/week   Barriers to discharge        Co-evaluation                AM-PAC PT "6 Clicks" Daily Activity  Outcome Measure Difficulty turning over in bed (including adjusting bedclothes, sheets and blankets)?: Unable Difficulty moving from lying on back to sitting on the side of the bed? : Unable Difficulty sitting down on and standing up from a chair with arms (e.g., wheelchair, bedside commode, etc,.)?: Unable Help needed moving to and from a bed to chair (including a wheelchair)?: A Little Help needed walking in hospital room?: A Little Help needed climbing 3-5 steps with a railing? : A Lot 6 Click Score: 11    End of Session Equipment Utilized During Treatment: Gait belt Activity Tolerance: Patient tolerated treatment well Patient left: in chair;with call bell/phone within reach;with chair alarm set;with family/visitor present Nurse Communication: Mobility status PT Visit Diagnosis: Difficulty in walking, not elsewhere classified (R26.2)    Time: 5852-7782 PT Time Calculation (min) (ACUTE ONLY): 23 min   Charges:   PT Evaluation $PT Eval Low Complexity: 1 Low PT Treatments $Gait Training: 8-22 mins   PT G Codes:        Pg [phone]   Evola Hollis 09/20/2017, 5:00 PM

## 2017-09-20 NOTE — Plan of Care (Signed)
Plan of care discussed with patient  D Mateo Flow RN

## 2017-09-20 NOTE — Interval H&P Note (Signed)
History and Physical Interval Note:  09/20/2017 7:04 AM  [name]  has presented today for surgery, with the diagnosis of Primary osteoarthritis Right hip/hip dysplasia  The various methods of treatment have been discussed with the patient and family. After consideration of risks, benefits and other options for treatment, the patient has consented to  Procedure(s): RIGHT TOTAL HIP ARTHROPLASTY ANTERIOR APPROACH (Right) as a surgical intervention .  The patient's history has been reviewed, patient examined, no change in status, stable for surgery.  I have reviewed the patient's chart and labs.  Questions were answered to the patient's satisfaction.     Pilar Plate Shanine Kreiger

## 2017-09-20 NOTE — Transfer of Care (Signed)
Immediate Anesthesia Transfer of Care Note  Patient: [name]  Procedure(s) Performed: RIGHT TOTAL HIP ARTHROPLASTY ANTERIOR APPROACH (Right Hip)  Patient Location: PACU  Anesthesia Type:MAC and Spinal  Level of Consciousness: awake, alert  and oriented  Airway & Oxygen Therapy: Patient Spontanous Breathing and Patient connected to face mask oxygen  Post-op Assessment: Report given to RN and Post -op Vital signs reviewed and stable  Post vital signs: Reviewed and stable  Last Vitals:  Vitals:   09/20/17 0633  BP: (!) 135/55  Pulse: 66  Resp: 18  Temp: 37.2 C  SpO2: 96%    Last Pain:  Vitals:   09/20/17 0633  TempSrc: Oral      Patients Stated Pain Goal: 4 (53/97/67 [id number])  Complications: No apparent anesthesia complications

## 2017-09-21 LAB — CBC
HCT: 29.6 % — ABNORMAL LOW (ref 36.0–46.0)
Hemoglobin: 10.1 g/dL — ABNORMAL LOW (ref 12.0–15.0)
MCH: 28.7 pg (ref 26.0–34.0)
MCHC: 34.1 g/dL (ref 30.0–36.0)
MCV: 84.1 fL (ref 78.0–100.0)
PLATELETS: 278 10*3/uL (ref 150–400)
RBC: 3.52 MIL/uL — AB (ref 3.87–5.11)
RDW: 13.2 % (ref 11.5–15.5)
WBC: 17.9 10*3/uL — AB (ref 4.0–10.5)

## 2017-09-21 LAB — BASIC METABOLIC PANEL
ANION GAP: 6 (ref 5–15)
BUN: 5 mg/dL — ABNORMAL LOW (ref 6–20)
CALCIUM: 8.4 mg/dL — AB (ref 8.9–10.3)
CO2: 27 mmol/L (ref 22–32)
Chloride: 99 mmol/L — ABNORMAL LOW (ref 101–111)
Creatinine, Ser: 0.53 mg/dL (ref 0.44–1.00)
Glucose, Bld: 117 mg/dL — ABNORMAL HIGH (ref 65–99)
POTASSIUM: 3.7 mmol/L (ref 3.5–5.1)
Sodium: 132 mmol/L — ABNORMAL LOW (ref 135–145)

## 2017-09-21 MED ORDER — TRAMADOL HCL 50 MG PO TABS: 50.0000 mg | ORAL_TABLET | Freq: Four times a day (QID) | ORAL | Status: DC | PRN

## 2017-09-21 NOTE — Progress Notes (Signed)
Physical Therapy Treatment Patient Details Name: [name] MRN: [id number] DOB: July 22, 1951 Today's Date: 09/21/2017    History of Present Illness Pt s/p R THR    PT Comments    Pt continues to progress well with mobility.  Spouse present and reviewed stairs, car transfers, home therex program.   Follow Up Recommendations  DC plan and follow up therapy as arranged by surgeon     Equipment Recommendations  None recommended by PT    Recommendations for Other Services       Precautions / Restrictions Precautions Precautions: Fall Restrictions Weight Bearing Restrictions: No Other Position/Activity Restrictions: WBAT    Mobility  Bed Mobility Overal bed mobility: Needs Assistance Bed Mobility: Sit to Supine     Supine to sit: Min assist Sit to supine: Min assist   General bed mobility comments: Increased time with cues for sequence and use of L LE to self assist  Transfers Overall transfer level: Needs assistance Equipment used: Rolling walker (2 wheeled) Transfers: Sit to/from Stand Sit to Stand: Supervision         General transfer comment: cues for LE management and use of UEs to self assist  Ambulation/Gait Ambulation/Gait assistance: Min guard;Supervision Ambulation Distance (Feet): 200 Feet Assistive device: 4-wheeled walker Gait Pattern/deviations: Step-to pattern;Decreased step length - right;Decreased step length - left;Shuffle;Trunk flexed Gait velocity: decr Gait velocity interpretation: Below normal speed for age/gender General Gait Details: min cues for posture, position from RW and initial sequence   Stairs Stairs: Yes   Stair Management: One rail Right;Step to pattern;Forwards;No rails;Backwards;With walker Number of Stairs: 6 General stair comments: single step twice bkwd with RW (stool into high bed).  2 steps twice with R rail and hand held assist on L vs both hands on R rail  Wheelchair Mobility    Modified Rankin (Stroke  Patients Only)       Balance                                            Cognition Arousal/Alertness: Awake/alert Behavior During Therapy: WFL for tasks assessed/performed Overall Cognitive Status: Within Functional Limits for tasks assessed                                        Exercises Total Joint Exercises Ankle Circles/Pumps: AROM;Both;15 reps;Supine Quad Sets: AROM;Both;10 reps;Supine Heel Slides: AAROM;Right;20 reps;Supine Hip ABduction/ADduction: AAROM;Right;15 reps;Supine    General Comments        Pertinent Vitals/Pain Pain Assessment: 0-10 Pain Score: 3  Pain Location: R hip Pain Descriptors / Indicators: Aching;Sore Pain Intervention(s): Limited activity within patient's tolerance;Monitored during session;Premedicated before session;Ice applied    Home Living                      Prior Function            PT Goals (current goals can now be found in the care plan section) Acute Rehab PT Goals Patient Stated Goal: HOME PT Goal Formulation: With patient Time For Goal Achievement: 09/27/17 Potential to Achieve Goals: Good Progress towards PT goals: Progressing toward goals    Frequency    7X/week      PT Plan Current plan remains appropriate    Co-evaluation  AM-PAC PT "6 Clicks" Daily Activity  Outcome Measure  Difficulty turning over in bed (including adjusting bedclothes, sheets and blankets)?: A Lot Difficulty moving from lying on back to sitting on the side of the bed? : A Lot Difficulty sitting down on and standing up from a chair with arms (e.g., wheelchair, bedside commode, etc,.)?: A Lot Help needed moving to and from a bed to chair (including a wheelchair)?: A Little Help needed walking in hospital room?: A Little Help needed climbing 3-5 steps with a railing? : A Little 6 Click Score: 15    End of Session Equipment Utilized During Treatment: Gait belt Activity  Tolerance: Patient tolerated treatment well Patient left: in bed;with call bell/phone within reach;with family/visitor present Nurse Communication: Mobility status PT Visit Diagnosis: Difficulty in walking, not elsewhere classified (R26.2)     Time: 9480-1655 PT Time Calculation (min) (ACUTE ONLY): 30 min  Charges:  $Gait Training: 8-22 mins $Therapeutic Exercise: 8-22 mins $Therapeutic Activity: 8-22 mins                    G Codes:       Pg [phone]    Cyleigh Massaro 09/21/2017, 1:13 PM

## 2017-09-21 NOTE — Discharge Summary (Signed)
Physician Discharge Summary   Patient ID: [name] MRN: [id number] DOB/AGE: 69-Apr-1952 69 y.o.  Admit date: 09/20/2017 Discharge date: 09/21/2017  Primary Diagnosis:  Osteoarthritis of the Right  hip.    Admission Diagnoses:  Past Medical History:  Diagnosis Date  . Adenomatous polyps   . Anxiety   . Arthritis    low back pain  . Bradycardia, sinus    with cardiac arrest in setting of respiratory arrest during anesthesia - cardiac workup with echo and nuclear showed no ischemia and normal LVF  . Cancer ([place]) 02/2010   basal cell skin cancer  . Chronic cough    occasional  . Depression   . 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 Difficult intubation    difficult intubation week  before 03/11/2013 surgery, was not intubated for 03-11-2013 surgery, but laid flat on stomach   . Difficult intubation    and coded and saw dr Tressia Miners turner and was told she had osa and that caused code  . GERD (gastroesophageal reflux disease)   . Headache(784.0)    migraines occasional  . Hyperlipidemia   . Hypothyroidism   . Multiple allergies   . OSA (obstructive sleep apnea)    on CPAP setting of 13  . PONV (postoperative nausea and vomiting)    has to use scop patch, coded during 23-Jun-20124 surgery on left foot with dr hewitt, sleep apnea found  . RLS (restless legs syndrome)   . Tachycardia, paroxysmal (HCC)    sinus tachycardia  . Wears glasses    Discharge Diagnoses:   Principal Problem:   OA (osteoarthritis) of hip  Estimated body mass index is 33.53 kg/m as calculated from the following:   Height as of this encounter: 4' 11"  (1.499 m).   Weight as of this encounter: 75.3 kg (166 lb).  Procedure(s) (LRB): RIGHT TOTAL HIP ARTHROPLASTY ANTERIOR APPROACH (Right)   Consults: None  HPI: [name] is a 67 y.o. female who has advanced end-  stage arthritis of their Right  hip with progressively worsening pain and    dysfunction.The patient has failed nonoperative management and presents for  total hip arthroplasty.    Laboratory Data: Admission on 09/20/2017  Component Date Value Ref Range Status  . WBC 09/21/2017 17.9* 4.0 - 10.5 K/uL Final  . RBC 09/21/2017 3.52* 3.87 - 5.11 MIL/uL Final  . Hemoglobin 09/21/2017 10.1* 12.0 - 15.0 g/dL Final  . HCT 09/21/2017 29.6* 36.0 - 46.0 % Final  . MCV 09/21/2017 84.1  78.0 - 100.0 fL Final  . MCH 09/21/2017 28.7  26.0 - 34.0 pg Final  . MCHC 09/21/2017 34.1  30.0 - 36.0 g/dL Final  . RDW 09/21/2017 13.2  11.5 - 15.5 % Final  . Platelets 09/21/2017 278  150 - 400 K/uL Final  . Sodium 09/21/2017 132* 135 - 145 mmol/L Final  . Potassium 09/21/2017 3.7  3.5 - 5.1 mmol/L Final  . Chloride 09/21/2017 99* 101 - 111 mmol/L Final  . CO2 09/21/2017 27  22 - 32 mmol/L Final  . Glucose, Bld 09/21/2017 117* 65 - 99 mg/dL Final  . BUN 09/21/2017 5* 6 - 20 mg/dL Final  . Creatinine, Ser 09/21/2017 0.53  0.44 - 1.00 mg/dL Final  . Calcium 09/21/2017 8.4* 8.9 - 10.3 mg/dL Final  . GFR calc non Af Amer 09/21/2017 >60  >60 mL/min Final  . GFR calc Af Amer 09/21/2017 >60  >60 mL/min Final  Comment: (NOTE) The eGFR has been calculated using the CKD EPI equation. This calculation has not been validated in all clinical situations. eGFR's persistently <60 mL/min signify possible Chronic Kidney Disease.   Georgiann Hahn gap 09/21/2017 6  5 - 15 Final  Hospital Outpatient Visit on 09/14/2017  Component Date Value Ref Range Status  . aPTT 09/14/2017 30  24 - 36 seconds Final  . WBC 09/14/2017 8.4  4.0 - 10.5 K/uL Final  . RBC 09/14/2017 4.75  3.87 - 5.11 MIL/uL Final  . Hemoglobin 09/14/2017 13.5  12.0 - 15.0 g/dL Final  . HCT 09/14/2017 40.4  36.0 - 46.0 % Final  . MCV 09/14/2017 85.1  78.0 - 100.0 fL Final  . MCH 09/14/2017 28.4  26.0 - 34.0 pg Final  . MCHC 09/14/2017 33.4  30.0 - 36.0 g/dL Final  . RDW 09/14/2017 13.1  11.5 - 15.5 % Final  . Platelets 09/14/2017 353   150 - 400 K/uL Final  . Sodium 09/14/2017 133* 135 - 145 mmol/L Final  . Potassium 09/14/2017 4.3  3.5 - 5.1 mmol/L Final  . Chloride 09/14/2017 100* 101 - 111 mmol/L Final  . CO2 09/14/2017 26  22 - 32 mmol/L Final  . Glucose, Bld 09/14/2017 103* 65 - 99 mg/dL Final  . BUN 09/14/2017 <5* 6 - 20 mg/dL Final  . Creatinine, Ser 09/14/2017 0.60  0.44 - 1.00 mg/dL Final  . Calcium 09/14/2017 9.0  8.9 - 10.3 mg/dL Final  . Total Protein 09/14/2017 6.9  6.5 - 8.1 g/dL Final  . Albumin 09/14/2017 3.6  3.5 - 5.0 g/dL Final  . AST 09/14/2017 15  15 - 41 U/L Final  . ALT 09/14/2017 14  14 - 54 U/L Final  . Alkaline Phosphatase 09/14/2017 75  38 - 126 U/L Final  . Total Bilirubin 09/14/2017 0.5  0.3 - 1.2 mg/dL Final  . GFR calc non Af Amer 09/14/2017 >60  >60 mL/min Final  . GFR calc Af Amer 09/14/2017 >60  >60 mL/min Final   Comment: (NOTE) The eGFR has been calculated using the CKD EPI equation. This calculation has not been validated in all clinical situations. eGFR's persistently <60 mL/min signify possible Chronic Kidney Disease.   . Anion gap 09/14/2017 7  5 - 15 Final  . Prothrombin Time 09/14/2017 13.4  11.4 - 15.2 seconds Final  . INR 09/14/2017 1.03   Final  . ABO/RH(D) 09/14/2017 B NEG   Final  . Antibody Screen 09/14/2017 NEG   Final  . Sample Expiration 09/14/2017 09/23/2017   Final  . Extend sample reason 09/14/2017 NO TRANSFUSIONS OR PREGNANCY IN THE PAST 3 MONTHS   Final  . MRSA, PCR 09/14/2017 NEGATIVE  NEGATIVE Final  . Staphylococcus aureus 09/14/2017 NEGATIVE  NEGATIVE Final   Comment: (NOTE) The Xpert SA Assay (FDA approved for NASAL specimens in patients 26 years of age and older), is one component of a comprehensive surveillance program. It is not intended to diagnose infection nor to guide or monitor treatment.   . ABO/RH(D) 09/14/2017 B NEG   Final     X-Rays:Dg Pelvis Portable  Result Date: 09/20/2017 CLINICAL DATA:  67 year old female status post right total  hip replacement, anterior approach. EXAM: PORTABLE PELVIS 1-2 VIEWS COMPARISON:  Intraoperative images 0928 hours today. FINDINGS: Portable AP view at 1008 hours. Bipolar type right hip arthroplasty hardware in place. Postoperative drain in place. Hardware appears intact, and normally aligned in the AP view. No unexpected osseous changes identified. IMPRESSION: Right  hip arthroplasty with no adverse features. Electronically Signed   By: Genevie Ann M.D.   On: 09/20/2017 10:35   Dg C-arm 1-60 Min-no Report  Result Date: 09/20/2017 Fluoroscopy was utilized by the requesting physician.  No radiographic interpretation.    EKG: Orders placed or performed during the hospital encounter of 09/14/17  . EKG 12 lead  . EKG 12 lead     Hospital Course: Patient was admitted to [facility] and taken to the OR and underwent the above state procedure without complications.  Patient tolerated the procedure well and was later transferred to the recovery room and then to the orthopaedic floor for postoperative care.  They were given PO and IV analgesics for pain control following their surgery.  They were given 24 hours of postoperative antibiotics of  Anti-infectives (From admission, onward)   Start     Dose/Rate Route Frequency Ordered Stop   09/20/17 1500  ceFAZolin (ANCEF) IVPB 2g/100 mL premix     2 g 200 mL/hr over 30 Minutes Intravenous Every 6 hours 09/20/17 1141 09/20/17 2220   09/20/17 0624  ceFAZolin (ANCEF) 2-4 GM/100ML-% IVPB    Comments:  [place] Session   : cabinet override      09/20/17 7353 09/20/17 0824   09/20/17 0620  ceFAZolin (ANCEF) IVPB 2g/100 mL premix     2 g 200 mL/hr over 30 Minutes Intravenous On call to O.R. 09/20/17 2992 09/20/17 0854     and started on DVT prophylaxis in the form of Xarelto.   PT and OT were ordered for total hip protocol.  The patient was allowed to be WBAT with therapy. Discharge planning was consulted to help with postop disposition and equipment needs.   Patient had a good night on the evening of surgery. They walked over 40 feet with therapy the day of surgery. They started to get up OOB again with therapy on day one.  Hemovac drain was pulled without difficulty.  Dressing was checked and looked good.  Patient was seen in rounds on day one and was ready to go home later that afternoon following therapy goals.  Diet - Cardiac diet Follow up - in 2 weeks Activity - WBAT Disposition - Home Condition Upon Discharge - Stable D/C Meds - See DC Summary DVT Prophylaxis - Xarelto     Discharge Instructions    Call MD / Call 911   Complete by:  As directed    If you experience chest pain or shortness of breath, CALL 911 and be transported to the hospital emergency room.  If you develope a fever above 101 F, pus (white drainage) or increased drainage or redness at the wound, or calf pain, call your surgeon's office.   Change dressing   Complete by:  As directed    You may change your dressing dressing daily with sterile 4 x 4 inch gauze dressing and paper tape.  Do not submerge the incision under water.   Constipation Prevention   Complete by:  As directed    Drink plenty of fluids.  Prune juice may be helpful.  You may use a stool softener, such as Colace (over the counter) 100 mg twice a day.  Use MiraLax (over the counter) for constipation as needed.   Diet - low sodium heart healthy   Complete by:  As directed    Discharge instructions   Complete by:  As directed    Take Xarelto for two and a half more weeks, then discontinue  Xarelto. Once the patient has completed the Xarelto, they may resume the 325 mg Aspirin.   Pick up stool softner and laxative for home use following surgery while on pain medications. Do not submerge incision under water. Please use good hand washing techniques while changing dressing each day. May shower starting three days after surgery. Please use a clean towel to pat the incision dry following showers. Continue  to use ice for pain and swelling after surgery. Do not use any lotions or creams on the incision until instructed by your surgeon.  Wear both TED hose on both legs during the day every day for three weeks, but may remove the TED hose at night at home.  Postoperative Constipation Protocol  Constipation - defined medically as fewer than three stools per week and severe constipation as less than one stool per week.  One of the most common issues patients have following surgery is constipation.  Even if you have a regular bowel pattern at home, your normal regimen is likely to be disrupted due to multiple reasons following surgery.  Combination of anesthesia, postoperative narcotics, change in appetite and fluid intake all can affect your bowels.  In order to avoid complications following surgery, here are some recommendations in order to help you during your recovery period.  Colace (docusate) - Pick up an over-the-counter form of Colace or another stool softener and take twice a day as long as you are requiring postoperative pain medications.  Take with a full glass of water daily.  If you experience loose stools or diarrhea, hold the colace until you stool forms back up.  If your symptoms do not get better within 1 week or if they get worse, check with your doctor.  Dulcolax (bisacodyl) - Pick up over-the-counter and take as directed by the product packaging as needed to assist with the movement of your bowels.  Take with a full glass of water.  Use this product as needed if not relieved by Colace only.   MiraLax (polyethylene glycol) - Pick up over-the-counter to have on hand.  MiraLax is a solution that will increase the amount of water in your bowels to assist with bowel movements.  Take as directed and can mix with a glass of water, juice, soda, coffee, or tea.  Take if you go more than two days without a movement. Do not use MiraLax more than once per day. Call your doctor if you are still  constipated or irregular after using this medication for 7 days in a row.  If you continue to have problems with postoperative constipation, please contact the office for further assistance and recommendations.  If you experience "the worst abdominal pain ever" or develop nausea or vomiting, please contact the office immediatly for further recommendations for treatment.   Do not sit on low chairs, stoools or toilet seats, as it may be difficult to get up from low surfaces   Complete by:  As directed    Driving restrictions   Complete by:  As directed    No driving until released by the physician.   Increase activity slowly as tolerated   Complete by:  As directed    Lifting restrictions   Complete by:  As directed    No lifting until released by the physician.   Patient may shower   Complete by:  As directed    You may shower without a dressing once there is no drainage.  Do not wash over the wound.  If  drainage remains, do not shower until drainage stops.   TED hose   Complete by:  As directed    Use stockings (TED hose) for 3 weeks on both leg(s).  You may remove them at night for sleeping.   Weight bearing as tolerated   Complete by:  As directed    Laterality:  right   Extremity:  Lower     Allergies as of 09/21/2017      Reactions   Dilaudid [hydromorphone Hcl] Nausea And Vomiting, Other (See Comments)   tachycardia      Medication List    STOP taking these medications   aspirin 325 MG tablet   CALCIUM-VITAMIN D PO   Cholecalciferol 4000 units Caps   estradiol 1 MG tablet Commonly known as:  ESTRACE   FISH OIL PO   HYDROcodone-acetaminophen 5-325 MG tablet Commonly known as:  NORCO/VICODIN   ibuprofen 200 MG tablet Commonly known as:  ADVIL,MOTRIN   multivitamin with minerals tablet     TAKE these medications   albuterol 108 (90 Base) MCG/ACT inhaler Commonly known as:  PROVENTIL HFA;VENTOLIN HFA Inhale 1-2 puffs into the lungs every 6 (six) hours as needed  for wheezing.   atenolol 25 MG tablet Commonly known as:  TENORMIN Take 25 mg by mouth every morning.   cetirizine-pseudoephedrine 5-120 MG tablet Commonly known as:  ZYRTEC-D Take 1 tablet by mouth daily as needed for allergies.   docusate sodium 100 MG capsule Commonly known as:  COLACE Take 100 mg by mouth daily as needed for mild constipation.   fluticasone 110 MCG/ACT inhaler Commonly known as:  FLOVENT HFA Inhale 2 puffs into the lungs 2 (two) times daily as needed (for shortness of breath or wheezing).   levothyroxine 125 MCG tablet Commonly known as:  SYNTHROID, LEVOTHROID Take 125 mcg by mouth daily before breakfast.   LORazepam 2 MG tablet Commonly known as:  ATIVAN Take 2 mg by mouth 2 (two) times daily as needed for anxiety.   methocarbamol 500 MG tablet Commonly known as:  ROBAXIN Take 1 tablet (500 mg total) by mouth every 6 (six) hours as needed for muscle spasms.   montelukast 10 MG tablet Commonly known as:  SINGULAIR Take 10 mg by mouth at bedtime.   oxyCODONE 5 MG immediate release tablet Commonly known as:  Oxy IR/ROXICODONE Take 1-2 tablets (5-10 mg total) by mouth every 4 (four) hours as needed for moderate pain or severe pain.   potassium chloride SA 20 MEQ tablet Commonly known as:  K-DUR,KLOR-CON Take 20 mEq by mouth daily.   rivaroxaban 10 MG Tabs tablet Commonly known as:  XARELTO Take 1 tablet (10 mg total) by mouth daily with breakfast. Take Xarelto for two and a half more weeks following discharge from the hospital, then discontinue Xarelto. Once the patient has completed the Xarelto, they may resume the 325 mg Aspirin.   rOPINIRole 0.5 MG tablet Commonly known as:  REQUIP Take 0.5 mg by mouth at bedtime.   sertraline 100 MG tablet Commonly known as:  ZOLOFT Take 100 mg by mouth every morning.   solifenacin 10 MG tablet Commonly known as:  VESICARE Take 10 mg by mouth daily.   temazepam 30 MG capsule Commonly known as:   RESTORIL Take 30 mg by mouth at bedtime as needed for sleep.   traMADol 50 MG tablet Commonly known as:  ULTRAM Take 1-2 tablets (50-100 mg total) by mouth every 6 (six) hours as needed (mild pain). What changed:  when to take this  reasons to take this   zolmitriptan 5 MG tablet Commonly known as:  ZOMIG Take 5 mg by mouth as needed for migraine.            Discharge Care Instructions  (From admission, onward)        Start     Ordered   09/20/17 0000  Weight bearing as tolerated    Question Answer Comment  Laterality right   Extremity Lower      09/20/17 2228   09/20/17 0000  Change dressing    Comments:  You may change your dressing dressing daily with sterile 4 x 4 inch gauze dressing and paper tape.  Do not submerge the incision under water.   09/20/17 2228     Follow-up Information    Gaynelle Arabian, MD. Schedule an appointment as soon as possible for a visit on 10/03/2017.   Specialty:  Orthopedic Surgery Contact information: [address] [place] [address] [phone]           Signed: Arlee Muslim, PA-C Orthopaedic Surgery 09/21/2017, 8:55 AM

## 2017-09-21 NOTE — Progress Notes (Signed)
Physical Therapy Treatment Patient Details Name: [name] MRN: [id number] DOB: 10/20/1950 Today's Date: 09/21/2017    History of Present Illness Pt s/p R THR    PT Comments    Pt progressing well with mobility and hopeful for dc home this pm.   Follow Up Recommendations  DC plan and follow up therapy as arranged by surgeon     Equipment Recommendations  None recommended by PT    Recommendations for Other Services       Precautions / Restrictions Precautions Precautions: Fall Restrictions Weight Bearing Restrictions: No Other Position/Activity Restrictions: WBAT    Mobility  Bed Mobility Overal bed mobility: Needs Assistance Bed Mobility: Supine to Sit     Supine to sit: Min assist     General bed mobility comments: Increased time with cues for sequence and use of L LE to self assist  Transfers Overall transfer level: Needs assistance Equipment used: Rolling walker (2 wheeled) Transfers: Sit to/from Stand Sit to Stand: Min guard         General transfer comment: cues for LE management and use of UEs to self assist  Ambulation/Gait Ambulation/Gait assistance: Min guard Ambulation Distance (Feet): 200 Feet Assistive device: 4-wheeled walker Gait Pattern/deviations: Step-to pattern;Decreased step length - right;Decreased step length - left;Shuffle;Trunk flexed Gait velocity: decr Gait velocity interpretation: Below normal speed for age/gender General Gait Details: cues for posture, position from RW and initial sequence   Stairs            Wheelchair Mobility    Modified Rankin (Stroke Patients Only)       Balance                                            Cognition Arousal/Alertness: Awake/alert Behavior During Therapy: WFL for tasks assessed/performed Overall Cognitive Status: Within Functional Limits for tasks assessed                                        Exercises Total Joint  Exercises Ankle Circles/Pumps: AROM;Both;15 reps;Supine Quad Sets: AROM;Both;10 reps;Supine Heel Slides: AAROM;Right;20 reps;Supine Hip ABduction/ADduction: AAROM;Right;15 reps;Supine    General Comments        Pertinent Vitals/Pain Pain Assessment: 0-10 Pain Score: 3  Pain Location: R hip Pain Descriptors / Indicators: Aching;Sore Pain Intervention(s): Limited activity within patient's tolerance;Monitored during session;Premedicated before session;Ice applied    Home Living                      Prior Function            PT Goals (current goals can now be found in the care plan section) Acute Rehab PT Goals Patient Stated Goal: HOME PT Goal Formulation: With patient Time For Goal Achievement: 09/27/17 Potential to Achieve Goals: Good Progress towards PT goals: Progressing toward goals    Frequency    7X/week      PT Plan Current plan remains appropriate    Co-evaluation              AM-PAC PT "6 Clicks" Daily Activity  Outcome Measure  Difficulty turning over in bed (including adjusting bedclothes, sheets and blankets)?: A Lot Difficulty moving from lying on back to sitting on the side of the bed? : A Lot Difficulty sitting  down on and standing up from a chair with arms (e.g., wheelchair, bedside commode, etc,.)?: A Lot Help needed moving to and from a bed to chair (including a wheelchair)?: A Little Help needed walking in hospital room?: A Little Help needed climbing 3-5 steps with a railing? : A Little 6 Click Score: 15    End of Session Equipment Utilized During Treatment: Gait belt Activity Tolerance: Patient tolerated treatment well Patient left: in chair;with call bell/phone within reach Nurse Communication: Mobility status PT Visit Diagnosis: Difficulty in walking, not elsewhere classified (R26.2)     Time: 7681-1572 PT Time Calculation (min) (ACUTE ONLY): 31 min  Charges:  $Gait Training: 8-22 mins $Therapeutic Exercise: 8-22  mins                    G Codes:       Pg [phone]    Rheagan Nayak 09/21/2017, 1:09 PM

## 2017-09-21 NOTE — Progress Notes (Signed)
Subjective: 1 Day Post-Op Procedure(s) (LRB): RIGHT TOTAL HIP ARTHROPLASTY ANTERIOR APPROACH (Right) Patient reports pain as mild.   Patient seen in rounds by Dr. Wynelle Link. Patient is well, but has had some minor complaints of pain in the hip, requiring pain medications We will start therapy today.  If they do well with therapy and meets all goals, then will allow home later this afternoon following therapy. Plan is to go Home after hospital stay.  Objective: Vital signs in last 24 hours: Temp:  [97.5 F (36.4 C)-98.7 F (37.1 C)] 98.2 F (36.8 C) (01/03 0550) Pulse Rate:  [57-79] 78 (01/03 0550) Resp:  [15-21] 20 (01/03 0550) BP: (91-138)/(40-69) 138/46 (01/03 0550) SpO2:  [92 %-100 %] 92 % (01/03 0550) Weight:  [75.3 kg (166 lb)] 75.3 kg (166 lb) (01/02 1130)  Intake/Output from previous day:  Intake/Output Summary (Last 24 hours) at 09/21/2017 0852 Last data filed at 09/21/2017 0838 Gross per 24 hour  Intake 4572.37 ml  Output 2565 ml  Net 2007.37 ml    Intake/Output this shift: Total I/O In: 240 [P.O.:240] Out: -   Labs: Recent Labs    09/21/17 0511  HGB 10.1*   Recent Labs    09/21/17 0511  WBC 17.9*  RBC 3.52*  HCT 29.6*  PLT 278   Recent Labs    09/21/17 0511  NA 132*  K 3.7  CL 99*  CO2 27  BUN 5*  CREATININE 0.53  GLUCOSE 117*  CALCIUM 8.4*   No results for input(s): LABPT, INR in the last 72 hours.  EXAM General - Patient is Alert, Appropriate and Oriented Extremity - Neurovascular intact Sensation intact distally Intact pulses distally Dorsiflexion/Plantar flexion intact Dressing - dressing C/D/I Motor Function - intact, moving foot and toes well on exam.  Hemovac pulled without difficulty.  Past Medical History:  Diagnosis Date  . Adenomatous polyps   . Anxiety   . Arthritis    low back pain  . Bradycardia, sinus    with cardiac arrest in setting of respiratory arrest during anesthesia - cardiac workup with echo and nuclear  showed no ischemia and normal LVF  . Cancer ([place]) 02/2010   basal cell skin cancer  . Chronic cough    occasional  . Depression   . 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 Difficult intubation    difficult intubation week  before 03/09/2013 surgery, was not intubated for 03-09-2013 surgery, but laid flat on stomach   . Difficult intubation    and coded and saw dr Tressia Miners turner and was told she had osa and that caused code  . GERD (gastroesophageal reflux disease)   . Headache(784.0)    migraines occasional  . Hyperlipidemia   . Hypothyroidism   . Multiple allergies   . OSA (obstructive sleep apnea)    on CPAP setting of 13  . PONV (postoperative nausea and vomiting)    has to use scop patch, coded during 06/21/20124 surgery on left foot with dr hewitt, sleep apnea found  . RLS (restless legs syndrome)   . Tachycardia, paroxysmal (HCC)    sinus tachycardia  . Wears glasses     Assessment/Plan: 1 Day Post-Op Procedure(s) (LRB): RIGHT TOTAL HIP ARTHROPLASTY ANTERIOR APPROACH (Right) Principal Problem:   OA (osteoarthritis) of hip  Estimated body mass index is 33.53 kg/m as calculated from the following:   Height as of this encounter: 4\' 11"  (1.499 m).   Weight as of  this encounter: 75.3 kg (166 lb). Up with therapy Discharge home with home health  DVT Prophylaxis - Xarelto Weight Bearing As Tolerated right Leg Hemovac Pulled Begin Therapy  If meets goals and able to go home: Discharge home with home health Diet - Cardiac diet Follow up - in 2 weeks Activity - WBAT Disposition - Home Condition Upon Discharge - Stable D/C Meds - See DC Summary DVT Prophylaxis - Xarelto  Arlee Muslim, PA-C Orthopaedic Surgery 09/21/2017, 8:52 AM

## 2017-09-21 NOTE — Progress Notes (Signed)
Discharge planning, spoke with patient. Have chosen [facility] for [facility] PT, evaluate and treat. Contacted [facility] for referral. No DME pending final PT/OT recommendations. [phone]

## 2017-09-21 NOTE — Plan of Care (Signed)
Plan of care reviewed and discussed with patient and husband. Denies and questions at this time.

## 2018-01-16 ENCOUNTER — Other Ambulatory Visit: Payer: Self-pay | Admitting: Gastroenterology

## 2018-01-25 ENCOUNTER — Encounter (HOSPITAL_COMMUNITY): Payer: Self-pay | Admitting: *Deleted

## 2018-01-25 ENCOUNTER — Other Ambulatory Visit: Payer: Self-pay

## 2018-02-02 ENCOUNTER — Ambulatory Visit (HOSPITAL_COMMUNITY): Payer: Medicare Other | Admitting: Anesthesiology

## 2018-02-02 ENCOUNTER — Encounter (HOSPITAL_COMMUNITY)
Admission: RE | Disposition: A | Discharge status: Home / Self Care | Payer: Self-pay | Source: Ambulatory Visit | Attending: Gastroenterology

## 2018-02-02 ENCOUNTER — Encounter (HOSPITAL_COMMUNITY): Payer: Self-pay | Admitting: Anesthesiology

## 2018-02-02 ENCOUNTER — Ambulatory Visit (HOSPITAL_COMMUNITY)
Admission: RE | Admit: 2018-02-02 | Discharge: 2018-02-02 | Disposition: A | Discharge status: Home / Self Care | Payer: Medicare Other | Source: Ambulatory Visit | Attending: Gastroenterology | Admitting: Gastroenterology

## 2018-02-02 DIAGNOSIS — F329 Major depressive disorder, single episode, unspecified: Secondary | ICD-10-CM | POA: Diagnosis not present

## 2018-02-02 DIAGNOSIS — E039 Hypothyroidism, unspecified: Secondary | ICD-10-CM | POA: Insufficient documentation

## 2018-02-02 DIAGNOSIS — K644 Residual hemorrhoidal skin tags: Secondary | ICD-10-CM | POA: Insufficient documentation

## 2018-02-02 DIAGNOSIS — Z86010 Personal history of colonic polyps: Secondary | ICD-10-CM | POA: Insufficient documentation

## 2018-02-02 DIAGNOSIS — Z885 Allergy status to narcotic agent status: Secondary | ICD-10-CM | POA: Insufficient documentation

## 2018-02-02 DIAGNOSIS — Z8371 Family history of colonic polyps: Secondary | ICD-10-CM | POA: Insufficient documentation

## 2018-02-02 DIAGNOSIS — K5140 Inflammatory polyps of colon without complications: Secondary | ICD-10-CM | POA: Diagnosis not present

## 2018-02-02 DIAGNOSIS — F419 Anxiety disorder, unspecified: Secondary | ICD-10-CM | POA: Insufficient documentation

## 2018-02-02 DIAGNOSIS — K5730 Diverticulosis of large intestine without perforation or abscess without bleeding: Secondary | ICD-10-CM | POA: Insufficient documentation

## 2018-02-02 DIAGNOSIS — D122 Benign neoplasm of ascending colon: Secondary | ICD-10-CM | POA: Diagnosis not present

## 2018-02-02 DIAGNOSIS — D123 Benign neoplasm of transverse colon: Secondary | ICD-10-CM | POA: Diagnosis not present

## 2018-02-02 DIAGNOSIS — Z800 Family history of malignant neoplasm of digestive organs: Secondary | ICD-10-CM | POA: Diagnosis not present

## 2018-02-02 DIAGNOSIS — Z8674 Personal history of sudden cardiac arrest: Secondary | ICD-10-CM | POA: Insufficient documentation

## 2018-02-02 DIAGNOSIS — G4733 Obstructive sleep apnea (adult) (pediatric): Secondary | ICD-10-CM | POA: Insufficient documentation

## 2018-02-02 DIAGNOSIS — Z79899 Other long term (current) drug therapy: Secondary | ICD-10-CM | POA: Diagnosis not present

## 2018-02-02 DIAGNOSIS — F17210 Nicotine dependence, cigarettes, uncomplicated: Secondary | ICD-10-CM | POA: Diagnosis not present

## 2018-02-02 DIAGNOSIS — Z7982 Long term (current) use of aspirin: Secondary | ICD-10-CM | POA: Insufficient documentation

## 2018-02-02 DIAGNOSIS — Z9989 Dependence on other enabling machines and devices: Secondary | ICD-10-CM | POA: Diagnosis not present

## 2018-02-02 DIAGNOSIS — Z96641 Presence of right artificial hip joint: Secondary | ICD-10-CM | POA: Diagnosis not present

## 2018-02-02 DIAGNOSIS — K219 Gastro-esophageal reflux disease without esophagitis: Secondary | ICD-10-CM | POA: Diagnosis not present

## 2018-02-02 DIAGNOSIS — Z1211 Encounter for screening for malignant neoplasm of colon: Secondary | ICD-10-CM | POA: Diagnosis not present

## 2018-02-02 DIAGNOSIS — Z85828 Personal history of other malignant neoplasm of skin: Secondary | ICD-10-CM | POA: Insufficient documentation

## 2018-02-02 DIAGNOSIS — G2581 Restless legs syndrome: Secondary | ICD-10-CM | POA: Diagnosis not present

## 2018-02-02 DIAGNOSIS — E785 Hyperlipidemia, unspecified: Secondary | ICD-10-CM | POA: Insufficient documentation

## 2018-02-02 HISTORY — PX: POLYPECTOMY: SHX5525

## 2018-02-02 HISTORY — PX: COLONOSCOPY WITH PROPOFOL: SHX5780

## 2018-02-02 HISTORY — PX: BIOPSY: SHX5522

## 2018-02-02 SURGERY — COLONOSCOPY WITH PROPOFOL
Anesthesia: Monitor Anesthesia Care

## 2018-02-02 MED ORDER — EPHEDRINE SULFATE 50 MG/ML IJ SOLN
INTRAMUSCULAR | Status: DC | PRN
  Administered 2018-02-02 (×5): 5 mg via INTRAVENOUS

## 2018-02-02 MED ORDER — PROPOFOL 10 MG/ML IV BOLUS
INTRAVENOUS | Status: AC
  Filled 2018-02-02: qty 20

## 2018-02-02 MED ORDER — PROPOFOL 500 MG/50ML IV EMUL
INTRAVENOUS | Status: DC | PRN
  Administered 2018-02-02: 30 mg via INTRAVENOUS
  Administered 2018-02-02: 20 mg via INTRAVENOUS

## 2018-02-02 MED ORDER — PROPOFOL 10 MG/ML IV BOLUS
INTRAVENOUS | Status: AC
  Filled 2018-02-02: qty 60

## 2018-02-02 MED ORDER — GLYCOPYRROLATE 0.2 MG/ML IJ SOLN
INTRAMUSCULAR | Status: DC | PRN
  Administered 2018-02-02: 0.2 mg via INTRAVENOUS

## 2018-02-02 MED ORDER — ONDANSETRON HCL 4 MG/2ML IJ SOLN
INTRAMUSCULAR | Status: DC | PRN
  Administered 2018-02-02: 4 mg via INTRAVENOUS

## 2018-02-02 MED ORDER — PROPOFOL 500 MG/50ML IV EMUL
INTRAVENOUS | Status: DC | PRN
  Administered 2018-02-02: 80 ug/kg/min via INTRAVENOUS

## 2018-02-02 MED ORDER — SODIUM CHLORIDE 0.9 % IV SOLN: INTRAVENOUS | Status: DC

## 2018-02-02 MED ORDER — LACTATED RINGERS IV SOLN
INTRAVENOUS | Status: DC
  Administered 2018-02-02: 1000 mL via INTRAVENOUS

## 2018-02-02 SURGICAL SUPPLY — 22 items

## 2018-02-02 NOTE — Anesthesia Preprocedure Evaluation (Signed)
Anesthesia Evaluation  Patient identified by MRN, date of birth, ID band Patient awake and Patient unresponsive    Reviewed: Allergy & Precautions, H&P , NPO status , Patient's Chart, lab work & pertinent test results, reviewed documented beta blocker date and time   History of Anesthesia Complications (+) PONV, DIFFICULT AIRWAY and history of anesthetic complications  Airway Mallampati: II  TM Distance: >3 FB Neck ROM: Full    Dental  (+) Caps, Dental Advisory Given All front upper teeth are capped:   Pulmonary sleep apnea , Current Smoker, former smoker,    Pulmonary exam normal breath sounds clear to auscultation       Cardiovascular Exercise Tolerance: Good negative cardio ROS Normal cardiovascular exam Rhythm:regular Rate:Normal     Neuro/Psych  Headaches, PSYCHIATRIC DISORDERS Anxiety Depression negative neurological ROS  negative psych ROS   GI/Hepatic Neg liver ROS, GERD  ,  Endo/Other  negative endocrine ROSHypothyroidism   Renal/GU negative Renal ROS  negative genitourinary   Musculoskeletal   Abdominal   Peds  Hematology negative hematology ROS (+)   Anesthesia Other Findings History of cardiac arrest shortly after induction in 2014   ECHO 18  Impressions:  - Normal LV size with mild LV hypertrophy. EF 60-65%. Normal RV  size and systolic function. No significant valvular  abnormalities.   Reproductive/Obstetrics negative OB ROS                             Lab Results  Component Value Date   WBC 17.9 (H) 09/21/2017   HGB 10.1 (L) 09/21/2017   HCT 29.6 (L) 09/21/2017   MCV 84.1 09/21/2017   PLT 278 09/21/2017   Lab Results  Component Value Date   CREATININE 0.53 09/21/2017   BUN 5 (L) 09/21/2017   NA 132 (L) 09/21/2017   K 3.7 09/21/2017   CL 99 (L) 09/21/2017   CO2 27 09/21/2017    Anesthesia Physical  Anesthesia Plan  ASA: III  Anesthesia Plan: MAC    Post-op Pain Management:    Induction: Intravenous  PONV Risk Score and Plan: 3 and Ondansetron, Treatment may vary due to age or medical condition and Propofol infusion  Airway Management Planned: Natural Airway, Nasal Cannula and Simple Face Mask  Additional Equipment:   Intra-op Plan:   Post-operative Plan:   Informed Consent: I have reviewed the patients History and Physical, chart, labs and discussed the procedure including the risks, benefits and alternatives for the proposed anesthesia with the patient or authorized representative who has indicated his/her understanding and acceptance.     Plan Discussed with: CRNA, Surgeon and Anesthesiologist  Anesthesia Plan Comments: (  )        Anesthesia Quick Evaluation

## 2018-02-02 NOTE — Anesthesia Postprocedure Evaluation (Signed)
Anesthesia Post Note  Patient: [name]  Procedure(s) Performed: COLONOSCOPY WITH PROPOFOL (N/A ) POLYPECTOMY (N/A ) BIOPSY (N/A )     Patient location during evaluation: PACU Anesthesia Type: MAC Level of consciousness: awake and alert Pain management: pain level controlled Vital Signs Assessment: post-procedure vital signs reviewed and stable Respiratory status: spontaneous breathing, nonlabored ventilation, respiratory function stable and patient connected to nasal cannula oxygen Cardiovascular status: stable and blood pressure returned to baseline Postop Assessment: no apparent nausea or vomiting Anesthetic complications: no    Last Vitals:  Vitals:   02/02/18 1005 02/02/18 1010  BP:  117/61  Pulse: 74 72  Resp: (!) 22 (!) 29  Temp:    SpO2: 92% 93%    Last Pain:  Vitals:   02/02/18 1005  TempSrc:   PainSc: 0-No pain                 Tiajuana Amass

## 2018-02-02 NOTE — Discharge Instructions (Signed)
YOU HAD AN ENDOSCOPIC PROCEDURE TODAY: Refer to the procedure report and other information in the discharge instructions given to you for any specific questions about what was found during the examination. If this information does not answer your questions, please call [facility] office at [phone] to clarify.  ° °YOU SHOULD EXPECT: Some feelings of bloating in the abdomen. Passage of more gas than usual. Walking can help get rid of the air that was put into your GI tract during the procedure and reduce the bloating. If you had a lower endoscopy (such as a colonoscopy or flexible sigmoidoscopy) you may notice spotting of blood in your stool or on the toilet paper. Some abdominal soreness may be present for a day or two, also. ° °DIET: Your first meal following the procedure should be a light meal and then it is ok to progress to your normal diet. A half-sandwich or bowl of soup is an example of a good first meal. Heavy or fried foods are harder to digest and may make you feel nauseous or bloated. Drink plenty of fluids but you should avoid alcoholic beverages for 24 hours. If you had a esophageal dilation, please see attached instructions for diet.  ° °ACTIVITY: Your care partner should take you home directly after the procedure. You should plan to take it easy, moving slowly for the rest of the day. You can resume normal activity the day after the procedure however YOU SHOULD NOT DRIVE, use power tools, machinery or perform tasks that involve climbing or major physical exertion for 24 hours (because of the sedation medicines used during the test).  ° °SYMPTOMS TO REPORT IMMEDIATELY: °A gastroenterologist can be reached at any hour. Please call [phone]  for any of the following symptoms:  °Following lower endoscopy (colonoscopy, flexible sigmoidoscopy) °Excessive amounts of blood in the stool  °Significant tenderness, worsening of abdominal pains  °Swelling of the abdomen that is new, acute  °Fever of 100° or  higher  °Following upper endoscopy (EGD, EUS, ERCP, esophageal dilation) °Vomiting of blood or coffee ground material  °New, significant abdominal pain  °New, significant chest pain or pain under the shoulder blades  °Painful or persistently difficult swallowing  °New shortness of breath  °Schamberger, tarry-looking or red, bloody stools ° °FOLLOW UP:  °If any biopsies were taken you will be contacted by phone or by letter within the next 1-3 weeks. Call [phone]  if you have not heard about the biopsies in 3 weeks.  °Please also call with any specific questions about appointments or follow up tests. ° °

## 2018-02-02 NOTE — H&P (Signed)
[name] is an 67 y.o. female.   Chief Complaint: History of colon polyps HPI: This pleasant 67 year old female has a history of a bradycardic cardiac arrest during an orthopedic procedure some years ago, perhaps related to unrecognized sleep apnea.  In any event, she comes in at this time for colon polyp surveillance, having had several small adenomatous polyps on each of several previous colonoscopies, most recently about 3 years ago.  Clinically, she is doing well except for occasional small volume painless hematochezia consistent with rectal outlet bleeding.  She does use CPAP on a nightly basis.  Past Medical History:  Diagnosis Date  . Adenomatous polyps   . Anxiety   . Arthritis    low back pain BETTER SINCE HIP SURGERY JAN 2019  . Bradycardia, sinus    with cardiac arrest in setting of respiratory arrest during anesthesia - cardiac workup with echo and nuclear showed no ischemia and normal LVF  . Cancer ([place]) 02/2010   SQUAMOUS CELL  cell skin cancer  . Chronic cough    occasional  . Depression   . 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 Difficult intubation    difficult intubation week  before 03/10/13 surgery, was not intubated for Mar 10, 2013 surgery, but laid flat on stomach   . Difficult intubation    and coded and saw dr Tressia Miners turner and was told she had osa and that caused code  . GERD (gastroesophageal reflux disease)   . Headache(784.0)    migraines occasional  . Hyperlipidemia   . Hypothyroidism   . Multiple allergies    SEASONAL  . OSA (obstructive sleep apnea)    on CPAP setting of 13  . PONV (postoperative nausea and vomiting)    has to use scop patch, coded during 2013-03-10 surgery on left foot with dr hewitt, sleep apnea found  . RLS (restless legs syndrome)   . Tachycardia, paroxysmal (HCC)    sinus tachycardia  . Wears glasses     Past Surgical History:  Procedure Laterality Date  . ABDOMINAL  HYSTERECTOMY  1988   partial  . APPENDECTOMY     with hyst  . COLONOSCOPY     x3  . COLONOSCOPY WITH PROPOFOL N/A 11/04/2014   Procedure: COLONOSCOPY WITH PROPOFOL;  Surgeon: Cleotis Nipper, MD;  Location: [facility];  Service: Endoscopy;  Laterality: N/A;  . ELBOW BURSA SURGERY  2012   lt  . GASTROCNEMIUS RECESSION Left 2013/03/10   Procedure: GASTROCNEMIUS RECESSION;  Surgeon: Wylene Simmer, MD;  Location: [facility];  Service: Orthopedics;  Laterality: Left;  . [place]  2010   [facility]  . STRABISMUS SURGERY     age 23 x3  . TOTAL HIP ARTHROPLASTY Right 09/20/2017   Procedure: RIGHT TOTAL HIP ARTHROPLASTY ANTERIOR APPROACH;  Surgeon: Gaynelle Arabian, MD;  Location: [facility];  Service: Orthopedics;  Laterality: Right;  . WISDOM TOOTH EXTRACTION      Family History  Problem Relation Age of Onset  . Colon polyps Mother   . Parkinson's disease Mother   . CAD Mother   . Arrhythmia Father        afib  . CVA Father   . Cancer - Colon Paternal Uncle   . Cancer - Colon Paternal Uncle    Social History:  reports that she has been smoking.  She has a 10.00 pack-year smoking history. She has never used smokeless tobacco. She reports that she drinks alcohol.  She reports that she does not use drugs.  Allergies:  Allergies  Allergen Reactions  . Dilaudid [Hydromorphone Hcl] Nausea And Vomiting and Other (See Comments)    Tachycardia, HOT MOUTH    Medications Prior to Admission  Medication Sig Dispense Refill  . aspirin 325 MG tablet Take 325 mg by mouth daily.    Marland Kitchen atenolol (TENORMIN) 25 MG tablet Take 25 mg by mouth every morning.     . B Complex-C (B-COMPLEX WITH VITAMIN C) tablet Take 1 tablet by mouth daily.    . Calcium Citrate-Vitamin D (CALCIUM + D PO) Take 1 tablet by mouth at bedtime.    . cetirizine-pseudoephedrine (ZYRTEC-D) 5-120 MG per tablet Take 1 tablet by mouth daily as needed for allergies.     . Cholecalciferol (VITAMIN D) 2000 units tablet Take  4,000 Units by mouth daily.    Marland Kitchen docusate sodium (COLACE) 100 MG capsule Take 100 mg by mouth daily as needed for mild constipation.    Marland Kitchen estradiol (ESTRACE) 1 MG tablet Take 1 mg by mouth daily.    . fluticasone (FLONASE) 50 MCG/ACT nasal spray Place 1 spray into both nostrils daily as needed for allergies or rhinitis.    . fluticasone (FLOVENT HFA) 110 MCG/ACT inhaler Inhale 2 puffs into the lungs 2 (two) times daily as needed (for shortness of breath or wheezing).    Marland Kitchen ibuprofen (ADVIL,MOTRIN) 200 MG tablet Take 600-800 mg by mouth every 8 (eight) hours as needed. TAKES, 3 PRN     . ketotifen (ZADITOR) 0.025 % ophthalmic solution Place 1 drop into both eyes 2 (two) times daily as needed.    Marland Kitchen levothyroxine (SYNTHROID, LEVOTHROID) 125 MCG tablet Take 125 mcg by mouth daily before breakfast.     . LORazepam (ATIVAN) 2 MG tablet Take 2 mg by mouth 2 (two) times daily as needed for anxiety.    . montelukast (SINGULAIR) 10 MG tablet Take 10 mg by mouth at bedtime.    . Multiple Vitamins-Minerals (MULTIVITAMIN WITH MINERALS) tablet Take 1 tablet by mouth daily.    Marland Kitchen rOPINIRole (REQUIP) 0.5 MG tablet Take 0.5 mg by mouth at bedtime.    . sertraline (ZOLOFT) 100 MG tablet Take 100 mg by mouth every morning.     . solifenacin (VESICARE) 10 MG tablet Take 10 mg by mouth daily.    Marland Kitchen zolmitriptan (ZOMIG) 5 MG tablet Take 5 mg by mouth as needed for migraine.    Marland Kitchen albuterol (PROVENTIL HFA;VENTOLIN HFA) 108 (90 BASE) MCG/ACT inhaler Inhale 1-2 puffs into the lungs every 6 (six) hours as needed for wheezing.       No results found for this or any previous visit (from the past 48 hour(s)). No results found.  ROS see HPI  Blood pressure 137/70, pulse (!) 58, temperature 98.3 F (36.8 C), temperature source Oral, resp. rate 16, height 4\' 11"  (1.499 m), weight 75.3 kg (166 lb), SpO2 94 %. Physical Exam Pleasant, moderately overweight, cognitively intact, no distress.  Oropharynx benign, chest clear, heart  without murmur or arrhythmia, abdomen moderately adipose but without mass-effect or tenderness  Assessment/Plan History of colon polyps and previous anesthesia related bradycardic arrest during an orthopedic procedure , but with at least 2 subsequent colonoscopies performed without incident.  Proceed to colonoscopic evaluation today.  Cleotis Nipper, MD 02/02/2018, 8:40 AM

## 2018-02-02 NOTE — Transfer of Care (Signed)
Immediate Anesthesia Transfer of Care Note  Patient: [name]  Procedure(s) Performed: Procedure(s): COLONOSCOPY WITH PROPOFOL (N/A) POLYPECTOMY (N/A) BIOPSY (N/A)  Patient Location: PACU  Anesthesia Type:MAC  Level of Consciousness:  sedated, patient cooperative and responds to stimulation  Airway & Oxygen Therapy:Patient Spontanous Breathing and Patient connected to face mask oxgen  Post-op Assessment:  Report given to PACU RN and Post -op Vital signs reviewed and stable  Post vital signs:  Reviewed and stable  Last Vitals:  Vitals:   02/02/18 0710  BP: 137/70  Pulse: (!) 58  Resp: 16  Temp: 36.8 C  SpO2: 19%    Complications: No apparent anesthesia complications

## 2018-02-02 NOTE — Op Note (Signed)
[facility] Patient Name: [name] Procedure Date: 02/02/2018 MRN: [id number] Attending MD: Ronald Lobo , MD Date of Birth: [birth year] CSN: [id number] Age: 67 Admit Type: Outpatient Procedure:                Colonoscopy Indications:              High risk colon cancer surveillance: Personal                            history of multiple (3 or more) adenomas, Last                            colonoscopy: February 2016 Providers:                Ronald Lobo, MD, Presley Raddle, RN, Cherylynn Ridges, Technician, Arnoldo Hooker, CRNA Referring MD:              Medicines:                Monitored Anesthesia Care Complications:            No immediate complications. Estimated Blood Loss:     Estimated blood loss was minimal. Procedure:                Pre-Anesthesia Assessment:                           - Prior to the procedure, a History and Physical                            was performed, and patient medications and                            allergies were reviewed. The patient's tolerance of                            previous anesthesia was also reviewed. This                            procedure was done at the hospital because of a                            prior history of BRADYCARDIC CARDIAC ARREST during                            an orthopedic procedure, perhaps due to                            unrecognized sleep apnea. The patient had had 2                            colonoscopies since then, without incident, but it  was felt prudent to do the procedure at the                            hospital nonetheless. The risks and benefits of the                            procedure and the sedation options and risks were                            discussed with the patient. All questions were                            answered, and informed consent was obtained. Prior   Anticoagulants: The patient has taken aspirin, last                            dose was 1 day prior to procedure. ASA Grade                            Assessment: III - A patient with severe systemic                            disease. After reviewing the risks and benefits,                            the patient was deemed in satisfactory condition to                            undergo the procedure.                           After obtaining informed consent, the colonoscope                            was passed under direct vision. Throughout the                            procedure, the patient's blood pressure, pulse, and                            oxygen saturations were monitored continuously. The                            EC-[id number] ([id number]) scope was introduced through                            the anus and advanced to the the terminal ileum.                            The colonoscopy was technically difficult and                            complex due to significant looping. Successful  completion of the procedure was aided by using                            manual pressure and straightening and shortening                            the scope to obtain bowel loop reduction. The                            patient tolerated the procedure well. There were no                            anesthesia problems during this exam. The quality                            of the bowel preparation was excellent. Scope In: 8:52:33 AM Scope Out: 9:33:53 AM Scope Withdrawal Time: 0 hours 15 minutes 55 seconds  Total Procedure Duration: 0 hours 41 minutes 20 seconds  Findings:      Skin tags were found on perianal exam.      Five flat and sessile and semi-pedunculated polyps were found in the       descending colon, proximal transverse colon, hepatic flexure, proximal       ascending colon and mid ascending colon. The polyps were 7 to 14 mm in       size. These polyps  were removed with a cold snare, with two of them       requiring momentary application of cautery to complete the transection.       Resection and retrieval were complete. Estimated blood loss was minimal.      Multiple small-mouthed diverticula were found in the sigmoid colon and       descending colon.      No other significant abnormalities were identified in a careful       examination of the remainder of the colon.      The terminal ileum appeared normal.      The retroflexed view of the distal rectum and anal verge was normal and       showed no anal or rectal abnormalities. Impression:               - Perianal skin tags found on perianal exam.                           - Five 7 to 14 mm polyps in the descending colon,                            in the proximal transverse colon, at the hepatic                            flexure, in the proximal ascending colon and in the                            mid ascending colon, removed with a cold snare and  minimal cautery. Resected and retrieved.                           - Diverticulosis in the sigmoid colon and in the                            descending colon.                           - The examined portion of the ileum was normal.                           - The distal rectum and anal verge are normal on                            retroflexion view. Moderate Sedation:      This patient was sedated with monitored anesthesia care, not moderate       sedation. Recommendation:           - Await pathology results.                           - Repeat colonoscopy in 3 years for surveillance.                           - Resume previous diet.                           - Continue present medications. Procedure Code(s):        --- Professional ---                           [phone], Colonoscopy, flexible; with removal of                            tumor(s), polyp(s), or other lesion(s) by snare                             technique Diagnosis Code(s):        --- Professional ---                           Z86.010, Personal history of colonic polyps                           D12.4, Benign neoplasm of descending colon                           D12.3, Benign neoplasm of transverse colon (hepatic                            flexure or splenic flexure)                           D12.2, Benign neoplasm of ascending colon CPT copyright 2017 American [employer] Association. All rights reserved. The codes documented in this report are preliminary and upon  coder review may  be revised to meet current compliance requirements. Ronald Lobo, MD 02/02/2018 10:02:18 AM This report has been signed electronically. Number of Addenda: 0

## 2018-02-05 ENCOUNTER — Encounter (HOSPITAL_COMMUNITY): Payer: Self-pay | Admitting: Gastroenterology

## 2018-02-20 ENCOUNTER — Telehealth: Payer: Self-pay | Admitting: Cardiology

## 2018-02-20 DIAGNOSIS — G4733 Obstructive sleep apnea (adult) (pediatric): Secondary | ICD-10-CM

## 2018-02-20 NOTE — Telephone Encounter (Signed)
Patient would like an order for a new cpap because her seal broke and they don't make replacement seals for her model machine.Patient was seen November of 2018. Please write order she is on a set pressure of 13 cm H20.

## 2018-02-20 NOTE — Telephone Encounter (Signed)
Pt calling   Pt need notes sent to [facility] stating she need a new CPAP machine. Please call pt when ready.

## 2018-02-21 NOTE — Telephone Encounter (Signed)
Resmed CPAP at 13cm H2o with heated humidity

## 2018-02-21 NOTE — Telephone Encounter (Signed)
Resmed CPAP at 13cm H2o with heated humidity placed to [facility]

## 2018-02-22 NOTE — Telephone Encounter (Signed)
Per Barbaraann Rondo [facility]) patient needed a recent office visit before she can receive her new cpap. Patient has been scheduled for an office visit on 05/10/18 at 2:20.

## 2018-03-26 ENCOUNTER — Encounter: Payer: Self-pay | Admitting: Cardiology

## 2018-04-02 ENCOUNTER — Telehealth: Payer: Self-pay | Admitting: Cardiology

## 2018-04-02 NOTE — Telephone Encounter (Signed)
Pt is aware and agreeable to her appointment scheduled for Monday 05/10/18.

## 2018-04-02 NOTE — Telephone Encounter (Signed)
New message   Patient needs appt prior to next available in order to meet requirements for getting cpap .  Please advise

## 2018-05-10 ENCOUNTER — Ambulatory Visit: Payer: Medicare Other | Admitting: Cardiology

## 2018-05-14 ENCOUNTER — Telehealth: Payer: Self-pay | Admitting: *Deleted

## 2018-05-14 ENCOUNTER — Encounter: Payer: Self-pay | Admitting: Cardiology

## 2018-05-14 ENCOUNTER — Ambulatory Visit (INDEPENDENT_AMBULATORY_CARE_PROVIDER_SITE_OTHER): Payer: Medicare Other | Admitting: Cardiology

## 2018-05-14 VITALS — BP 126/64 | HR 60 | Ht 59.0 in | Wt 164.0 lb

## 2018-05-14 DIAGNOSIS — G4733 Obstructive sleep apnea (adult) (pediatric): Secondary | ICD-10-CM | POA: Diagnosis not present

## 2018-05-14 DIAGNOSIS — G2581 Restless legs syndrome: Secondary | ICD-10-CM

## 2018-05-14 DIAGNOSIS — E669 Obesity, unspecified: Secondary | ICD-10-CM | POA: Diagnosis not present

## 2018-05-14 NOTE — Telephone Encounter (Signed)
-----   Message from Richmond Campbell, LPN sent at [phone] 12:02 PM EDT ----- PT NEEDS RESMED CPAP DEVICE WITH HEATED HUMIDITY AT 13 CM [place]

## 2018-05-14 NOTE — Telephone Encounter (Signed)
Order placed to [facility] ,[employer] office

## 2018-05-14 NOTE — Progress Notes (Signed)
Cardiology Office Note:    Date:  05/14/2018   ID:  [name], DOB [birth year], MRN [id number]  PCP:  Shirline Frees, MD  Cardiologist:  No primary care provider on file.    Referring MD: Shirline Frees, MD   Chief Complaint  Patient presents with  . Sleep Apnea    History of Present Illness:    [name] is a 67 y.o. female with a hx of  OSA, obesity and RLS.He is doing well with his CPAP device.  He tolerates the mask and feels the pressure is adequate.  Since going on CPAP his feels rested in the am and has no significant daytime sleepiness.  He denies any significant mouth or nasal dryness or nasal congestion.  He does not think that he snores.  Recently she has had some problems with her device coming up with errors especially with humidity.  She called [facility] and they told her she needed an office visit to document problems with the device prior to getting a new CPAP device.  She has not really had any problems using her CPAP and has been benefiting from it but recently has not been able to use it because it will shut off in the middle the night and not use water.   Past Medical History:  Diagnosis Date  . Adenomatous polyps   . Anxiety   . Arthritis    low back pain BETTER SINCE HIP SURGERY JAN 2019  . Bradycardia, sinus    with cardiac arrest in setting of respiratory arrest during anesthesia - cardiac workup with echo and nuclear showed no ischemia and normal LVF  . Cancer ([place]) 02/2010   SQUAMOUS CELL  cell skin cancer  . Chronic cough    occasional  . Depression   . 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 Difficult intubation    difficult intubation week  before Mar 14, 2013 surgery, was not intubated for 14-Mar-2013 surgery, but laid flat on stomach   . Difficult intubation    and coded and saw dr Tressia Miners turner and was told she had osa and that caused code  . GERD (gastroesophageal reflux disease)   .  Headache(784.0)    migraines occasional  . Hyperlipidemia   . Hypothyroidism   . Multiple allergies    SEASONAL  . OSA (obstructive sleep apnea)    on CPAP setting of 13  . PONV (postoperative nausea and vomiting)    has to use scop patch, coded during 03-14-13 surgery on left foot with dr hewitt, sleep apnea found  . RLS (restless legs syndrome)   . Tachycardia, paroxysmal (HCC)    sinus tachycardia  . Wears glasses     Past Surgical History:  Procedure Laterality Date  . ABDOMINAL HYSTERECTOMY  1988   partial  . APPENDECTOMY     with hyst  . BIOPSY N/A 02/02/2018   Procedure: BIOPSY;  Surgeon: Ronald Lobo, MD;  Location: [facility];  Service: Endoscopy;  Laterality: N/A;  . COLONOSCOPY     x3  . COLONOSCOPY WITH PROPOFOL N/A 11/04/2014   Procedure: COLONOSCOPY WITH PROPOFOL;  Surgeon: Cleotis Nipper, MD;  Location: [facility];  Service: Endoscopy;  Laterality: N/A;  . COLONOSCOPY WITH PROPOFOL N/A 02/02/2018   Procedure: COLONOSCOPY WITH PROPOFOL;  Surgeon: Ronald Lobo, MD;  Location: [facility];  Service: Endoscopy;  Laterality: N/A;  . ELBOW BURSA SURGERY  2012   lt  . GASTROCNEMIUS RECESSION  Left 02/21/2013   Procedure: GASTROCNEMIUS RECESSION;  Surgeon: Wylene Simmer, MD;  Location: [facility];  Service: Orthopedics;  Laterality: Left;  . [place]  2010   [facility]  . POLYPECTOMY N/A 02/02/2018   Procedure: POLYPECTOMY;  Surgeon: Ronald Lobo, MD;  [facility];  Service: Endoscopy;  Laterality: N/A;  . STRABISMUS SURGERY     age 33 x3  . TOTAL HIP ARTHROPLASTY Right 09/20/2017   Procedure: RIGHT TOTAL HIP ARTHROPLASTY ANTERIOR APPROACH;  Surgeon: Gaynelle Arabian, MD;  Location: [facility];  Service: Orthopedics;  Laterality: Right;  . WISDOM TOOTH EXTRACTION      Current Medications: No outpatient medications have been marked as taking for the 05/14/18 encounter (Office Visit) with Sueanne Margarita, MD.     Allergies:   Dilaudid  [hydromorphone hcl]   Social History   Socioeconomic History  . Marital status: Married    Spouse name: Not on file  . Number of children: Not on file  . Years of education: Not on file  . Highest education level: Not on file  Occupational History  . Occupation: retired  Scientific laboratory technician  . Financial resource strain: Not on file  . Food insecurity:    Worry: Not on file    Inability: Not on file  . Transportation needs:    Medical: Not on file    Non-medical: Not on file  Tobacco Use  . Smoking status: Current Some Day Smoker    Packs/day: 0.50    Years: 20.00    Pack years: 10.00    Last attempt to quit: 01/21/2013    Years since quitting: 5.3  . Smokeless tobacco: Never Used  Substance and Sexual Activity  . Alcohol use: Yes    Comment: rare  . Drug use: No  . Sexual activity: Not on file    Comment: trying to cut down  Lifestyle  . Physical activity:    Days per week: Not on file    Minutes per session: Not on file  . Stress: Not on file  Relationships  . Social connections:    Talks on phone: Not on file    Gets together: Not on file    Attends religious service: Not on file    Active member of club or organization: Not on file    Attends meetings of clubs or organizations: Not on file    Relationship status: Not on file  Other Topics Concern  . Not on file  Social History Narrative  . Not on file     Family History: The patient's family history includes Arrhythmia in her father; CAD in her mother; CVA in her father; Cancer - Colon in her paternal uncle and paternal uncle; Colon polyps in her mother; Parkinson's disease in her mother.  ROS:   Please see the history of present illness.    ROS  All other systems reviewed and negative.   EKGs/Labs/Other Studies Reviewed:    The following studies were reviewed today: PAP download  EKG:  EKG is not ordered today.     Recent Labs: 09/14/2017: ALT 14 09/21/2017: BUN 5; Creatinine, Ser 0.53; Hemoglobin 10.1;  Platelets 278; Potassium 3.7; Sodium 132   Recent Lipid Panel No results found for: CHOL, TRIG, HDL, CHOLHDL, VLDL, LDLCALC, LDLDIRECT  Physical Exam:    VS:  There were no vitals taken for this visit.    Wt Readings from Last 3 Encounters:  02/02/18 166 lb (75.3 kg)  09/20/17 166 lb (75.3  kg)  09/14/17 166 lb 8 oz (75.5 kg)     GEN:  Well nourished, well developed in no acute distress HEENT: Normal NECK: No JVD; No carotid bruits LYMPHATICS: No lymphadenopathy CARDIAC: RRR, no murmurs, rubs, gallops RESPIRATORY:  Clear to auscultation without rales, wheezing or rhonchi  ABDOMEN: Soft, non-tender, non-distended MUSCULOSKELETAL:  No edema; No deformity  SKIN: Warm and dry NEUROLOGIC:  Alert and oriented x 3 PSYCHIATRIC:  Normal affect   ASSESSMENT:    1. OSA (obstructive sleep apnea)   2. RLS (restless legs syndrome)   3. Obesity (BMI 30-39.9)    PLAN:    In order of problems listed above:  1.  OSA - the patient is tolerating PAP therapy well without any problems. The PAP download was reviewed today and showed an AHI of 0.5/hr on 13 cm H2O with 97% compliance in using more than 4 hours nightly.  The patient has been using and benefiting from PAP use and will continue to benefit from therapy.  I am going to order her a new ResMed CPAP with heated humidity set at 13 cm H2O.  She will see me back in 10 weeks for insurance requirements to document compliance.  2.  Restless legs syndrome - this is well controlled on Ropinirole 0.5mg  qhs.   3. Obesity - I have encouraged him to get into a routine exercise program and cut back on carbs and portions.    Medication Adjustments/Labs and Tests Ordered: Current medicines are reviewed at length with the patient today.  Concerns regarding medicines are outlined above.  No orders of the defined types were placed in this encounter.  No orders of the defined types were placed in this encounter.   Signed, Fransico Him, MD  05/14/2018  11:37 AM    [place]

## 2018-05-14 NOTE — Patient Instructions (Signed)
Your physician recommends that you continue on your current medications as directed. Please refer to the Current Medication list given to you today.   Your physician recommends that you schedule a follow-up appointment in: [place] WILL CALL WITH APPT .Adonis Housekeeper

## 2018-07-23 ENCOUNTER — Encounter: Payer: Self-pay | Admitting: Cardiology

## 2018-08-13 ENCOUNTER — Encounter: Payer: Self-pay | Admitting: Cardiology

## 2018-08-13 ENCOUNTER — Ambulatory Visit (INDEPENDENT_AMBULATORY_CARE_PROVIDER_SITE_OTHER): Payer: Medicare Other | Admitting: Cardiology

## 2018-08-13 VITALS — BP 134/72 | HR 66 | Ht 59.0 in | Wt 166.0 lb

## 2018-08-13 DIAGNOSIS — G4733 Obstructive sleep apnea (adult) (pediatric): Secondary | ICD-10-CM

## 2018-08-13 DIAGNOSIS — E669 Obesity, unspecified: Secondary | ICD-10-CM

## 2018-08-13 DIAGNOSIS — G2581 Restless legs syndrome: Secondary | ICD-10-CM | POA: Diagnosis not present

## 2018-08-13 NOTE — Progress Notes (Signed)
Cardiology Office Note:    Date:  08/18/2018   ID:  [name], DOB [birth year], MRN [id number]  PCP:  Shirline Frees, MD  Cardiologist:  No primary care provider on file.    Referring MD: Shirline Frees, MD   Chief Complaint  Patient presents with  . Sleep Apnea    History of Present Illness:    [name] is a 67 y.o. female with a hx of OSA, obesity and RLS.  Was recently seen by me back in August and was having problems with her device not working correctly.  I ordered a new ResMed CPAP and she is now back for follow-up.She is doing well with her CPAP device and thinks that she has gotten used to it.  She tolerates the mask and feels the pressure is adequate.  Since going on CPAP she feels rested in the am and has no significant daytime sleepiness.  She denies any significant mouth or nasal dryness or nasal congestion.  She does not think that he snores.     Past Medical History:  Diagnosis Date  . Adenomatous polyps   . Anxiety   . Arthritis    low back pain BETTER SINCE HIP SURGERY JAN 2019  . Bradycardia, sinus    with cardiac arrest in setting of respiratory arrest during anesthesia - cardiac workup with echo and nuclear showed no ischemia and normal LVF  . Cancer ([place]) 02/2010   SQUAMOUS CELL  cell skin cancer  . Chronic cough    occasional  . Depression   . 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 Difficult intubation    difficult intubation week  before Mar 10, 2013 surgery, was not intubated for 2013/03/10 surgery, but laid flat on stomach   . Difficult intubation    and coded and saw dr Tressia Miners Kahlani Graber and was told she had osa and that caused code  . GERD (gastroesophageal reflux disease)   . Headache(784.0)    migraines occasional  . Hyperlipidemia   . Hypothyroidism   . Multiple allergies    SEASONAL  . OSA (obstructive sleep apnea)    on CPAP setting of 13  . PONV (postoperative nausea and vomiting)    has to use scop patch, coded during 03-10-2013 surgery on left foot with dr hewitt, sleep apnea found  . RLS (restless legs syndrome)   . Tachycardia, paroxysmal (HCC)    sinus tachycardia  . Wears glasses     Past Surgical History:  Procedure Laterality Date  . ABDOMINAL HYSTERECTOMY  1988   partial  . APPENDECTOMY     with hyst  . BIOPSY N/A 02/02/2018   Procedure: BIOPSY;  Surgeon: Ronald Lobo, MD;  [facility];  Service: Endoscopy;  Laterality: N/A;  . COLONOSCOPY     x3  . COLONOSCOPY WITH PROPOFOL N/A 11/04/2014   Procedure: COLONOSCOPY WITH PROPOFOL;  Surgeon: Cleotis Nipper, MD;  [facility];  Service: Endoscopy;  Laterality: N/A;  . COLONOSCOPY WITH PROPOFOL N/A 02/02/2018   Procedure: COLONOSCOPY WITH PROPOFOL;  Surgeon: Ronald Lobo, MD;  [facility];  Service: Endoscopy;  Laterality: N/A;  . ELBOW BURSA SURGERY  2012   lt  . GASTROCNEMIUS RECESSION Left 2013-03-10   Procedure: GASTROCNEMIUS RECESSION;  Surgeon: Wylene Simmer, MD;  Location: [facility];  Service: Orthopedics;  Laterality: Left;  . [place]  2010   [facility]  . POLYPECTOMY N/A 02/02/2018   Procedure: POLYPECTOMY;  Surgeon: Ronald Lobo, MD;  Location: [facility];  Service: Endoscopy;  Laterality: N/A;  . STRABISMUS SURGERY     age 67 x3  . TOTAL HIP ARTHROPLASTY Right 09/20/2017   Procedure: RIGHT TOTAL HIP ARTHROPLASTY ANTERIOR APPROACH;  Surgeon: Gaynelle Arabian, MD;  [facility];  Service: Orthopedics;  Laterality: Right;  . WISDOM TOOTH EXTRACTION      Current Medications: Current Meds  Medication Sig  . albuterol (PROVENTIL HFA;VENTOLIN HFA) 108 (90 BASE) MCG/ACT inhaler Inhale 1-2 puffs into the lungs every 6 (six) hours as needed for wheezing.   Marland Kitchen aspirin 325 MG tablet Take 325 mg by mouth daily.  Marland Kitchen atenolol (TENORMIN) 25 MG tablet Take 25 mg by mouth every morning.   . Calcium Citrate-Vitamin D (CALCIUM + D PO) Take 1 tablet by mouth  at bedtime.  . cetirizine-pseudoephedrine (ZYRTEC-D) 5-120 MG per tablet Take 1 tablet by mouth daily as needed for allergies.   . Cholecalciferol (VITAMIN D) 2000 units tablet Take 4,000 Units by mouth daily.  Marland Kitchen docusate sodium (COLACE) 100 MG capsule Take 100 mg by mouth daily as needed for mild constipation.  Marland Kitchen estradiol (ESTRACE) 1 MG tablet Take 1 mg by mouth daily.  . fluticasone (FLONASE) 50 MCG/ACT nasal spray Place 1 spray into both nostrils daily as needed for allergies or rhinitis.  . fluticasone (FLOVENT HFA) 110 MCG/ACT inhaler Inhale 2 puffs into the lungs 2 (two) times daily as needed (for shortness of breath or wheezing).  Marland Kitchen ibuprofen (ADVIL,MOTRIN) 200 MG tablet Take 600-800 mg by mouth every 8 (eight) hours as needed. TAKES, 3 PRN   . ketotifen (ZADITOR) 0.025 % ophthalmic solution Place 1 drop into both eyes 2 (two) times daily as needed.  Marland Kitchen levothyroxine (SYNTHROID, LEVOTHROID) 125 MCG tablet Take 125 mcg by mouth daily before breakfast.   . LORazepam (ATIVAN) 2 MG tablet Take 2 mg by mouth 2 (two) times daily as needed for anxiety.  . montelukast (SINGULAIR) 10 MG tablet Take 10 mg by mouth at bedtime.  . Multiple Vitamins-Minerals (MULTIVITAMIN WITH MINERALS) tablet Take 1 tablet by mouth daily.  . Omega-3 Fatty Acids (FISH OIL PO) Take 1,300 mg by mouth daily.  Marland Kitchen rOPINIRole (REQUIP) 1 MG tablet Take 1 mg by mouth at bedtime.  . sertraline (ZOLOFT) 100 MG tablet Take 100 mg by mouth every morning.   . solifenacin (VESICARE) 10 MG tablet Take 10 mg by mouth daily.  . traZODone (DESYREL) 50 MG tablet Take 50 mg by mouth at bedtime as needed.  . zolmitriptan (ZOMIG) 5 MG tablet Take 5 mg by mouth as needed for migraine.     Allergies:   Dilaudid [hydromorphone hcl]   Social History   Socioeconomic History  . Marital status: Married    Spouse name: Not on file  . Number of children: Not on file  . Years of education: Not on file  . Highest education level: Not on file    Occupational History  . Occupation: retired  Scientific laboratory technician  . Financial resource strain: Not on file  . Food insecurity:    Worry: Not on file    Inability: Not on file  . Transportation needs:    Medical: Not on file    Non-medical: Not on file  Tobacco Use  . Smoking status: Current Some Day Smoker    Packs/day: 0.50    Years: 20.00    Pack years: 10.00    Last attempt to quit: 01/21/2013    Years  since quitting: 5.5  . Smokeless tobacco: Never Used  Substance and Sexual Activity  . Alcohol use: Yes    Comment: rare  . Drug use: No  . Sexual activity: Not on file    Comment: trying to cut down  Lifestyle  . Physical activity:    Days per week: Not on file    Minutes per session: Not on file  . Stress: Not on file  Relationships  . Social connections:    Talks on phone: Not on file    Gets together: Not on file    Attends religious service: Not on file    Active member of club or organization: Not on file    Attends meetings of clubs or organizations: Not on file    Relationship status: Not on file  Other Topics Concern  . Not on file  Social History Narrative  . Not on file     Family History: The patient's family history includes Arrhythmia in her father; CAD in her mother; CVA in her father; Cancer - Colon in her paternal uncle and paternal uncle; Colon polyps in her mother; Parkinson's disease in her mother.  ROS:   Please see the history of present illness.    ROS  All other systems reviewed and negative.   EKGs/Labs/Other Studies Reviewed:    The following studies were reviewed today: PAP download  EKG:  EKG is not ordered today.   Recent Labs: 09/14/2017: ALT 14 09/21/2017: BUN 5; Creatinine, Ser 0.53; Hemoglobin 10.1; Platelets 278; Potassium 3.7; Sodium 132   Recent Lipid Panel No results found for: CHOL, TRIG, HDL, CHOLHDL, VLDL, LDLCALC, LDLDIRECT  Physical Exam:    VS:  BP 134/72   Pulse 66   Ht 4\' 11"  (1.499 m)   Wt 166 lb (75.3 kg)    SpO2 96%   BMI 33.53 kg/m     Wt Readings from Last 3 Encounters:  08/13/18 166 lb (75.3 kg)  05/14/18 164 lb (74.4 kg)  02/02/18 166 lb (75.3 kg)     GEN:  Well nourished, well developed in no acute distress HEENT: Normal NECK: No JVD; No carotid bruits LYMPHATICS: No lymphadenopathy CARDIAC: RRR, no murmurs, rubs, gallops RESPIRATORY:  Clear to auscultation without rales, wheezing or rhonchi  ABDOMEN: Soft, non-tender, non-distended MUSCULOSKELETAL:  No edema; No deformity  SKIN: Warm and dry NEUROLOGIC:  Alert and oriented x 3 PSYCHIATRIC:  Normal affect   ASSESSMENT:    1. OSA (obstructive sleep apnea)   2. Obesity (BMI 30-39.9)   3. RLS (restless legs syndrome)    PLAN:    In order of problems listed above:  1.  OSA - the patient is tolerating PAP therapy well without any problems. The PAP download was reviewed today and showed an AHI of 0.5/hr on 13 cm H2O with 100% compliance in using more than 4 hours nightly.  The patient has been using and benefiting from PAP use and will continue to benefit from therapy.   2.  Obesity - I have encouraged him to get into a routine exercise program and cut back on carbs and portions.   3.  Restless legs syndrome - this is controlled on Requip 1 mg at bedtime     Medication Adjustments/Labs and Tests Ordered: Current medicines are reviewed at length with the patient today.  Concerns regarding medicines are outlined above.  No orders of the defined types were placed in this encounter.  No orders of the defined types were placed  in this encounter.   Signed, Fransico Him, MD  08/18/2018 12:07 PM    [place]

## 2018-08-13 NOTE — Patient Instructions (Addendum)
Medication Instructions: Your physician recommends that you continue on your current medications as directed. Please refer to the Current Medication list given to you today.  If you need a refill on your cardiac medications before your next appointment, please call your pharmacy.   Lab work:  If you have labs (blood work) drawn today and your tests are completely normal, you will receive your results only by: Marland Kitchen MyChart Message (if you have MyChart) OR . A paper copy in the mail If you have any lab test that is abnormal or we need to change your treatment, we will call you to review the results.  Follow-Up: At [facility], you and your health needs are our priority.  As part of our continuing mission to provide you with exceptional heart care, we have created designated Provider Care Teams.  These Care Teams include your primary Cardiologist (physician) and Advanced Practice Providers (APPs -  Physician Assistants and Nurse Practitioners) who all work together to provide you with the care you need, when you need it. . You will need a follow up appointment in 1 years.  Please call our office 2 months in advance to schedule this appointment.  You may see Dr. Alona Bene Nasal Mask written prescription was given.

## 2018-12-19 IMAGING — DX DG PORTABLE PELVIS
1 series · 1 of 1 positions shown · non-contrast
Comparison: Intraoperative images [id number] hours today.

CLINICAL DATA: 66-year-old female status post right total hip
replacement, anterior approach.

EXAM:
PORTABLE PELVIS 1-2 VIEWS

[pelvis ap]
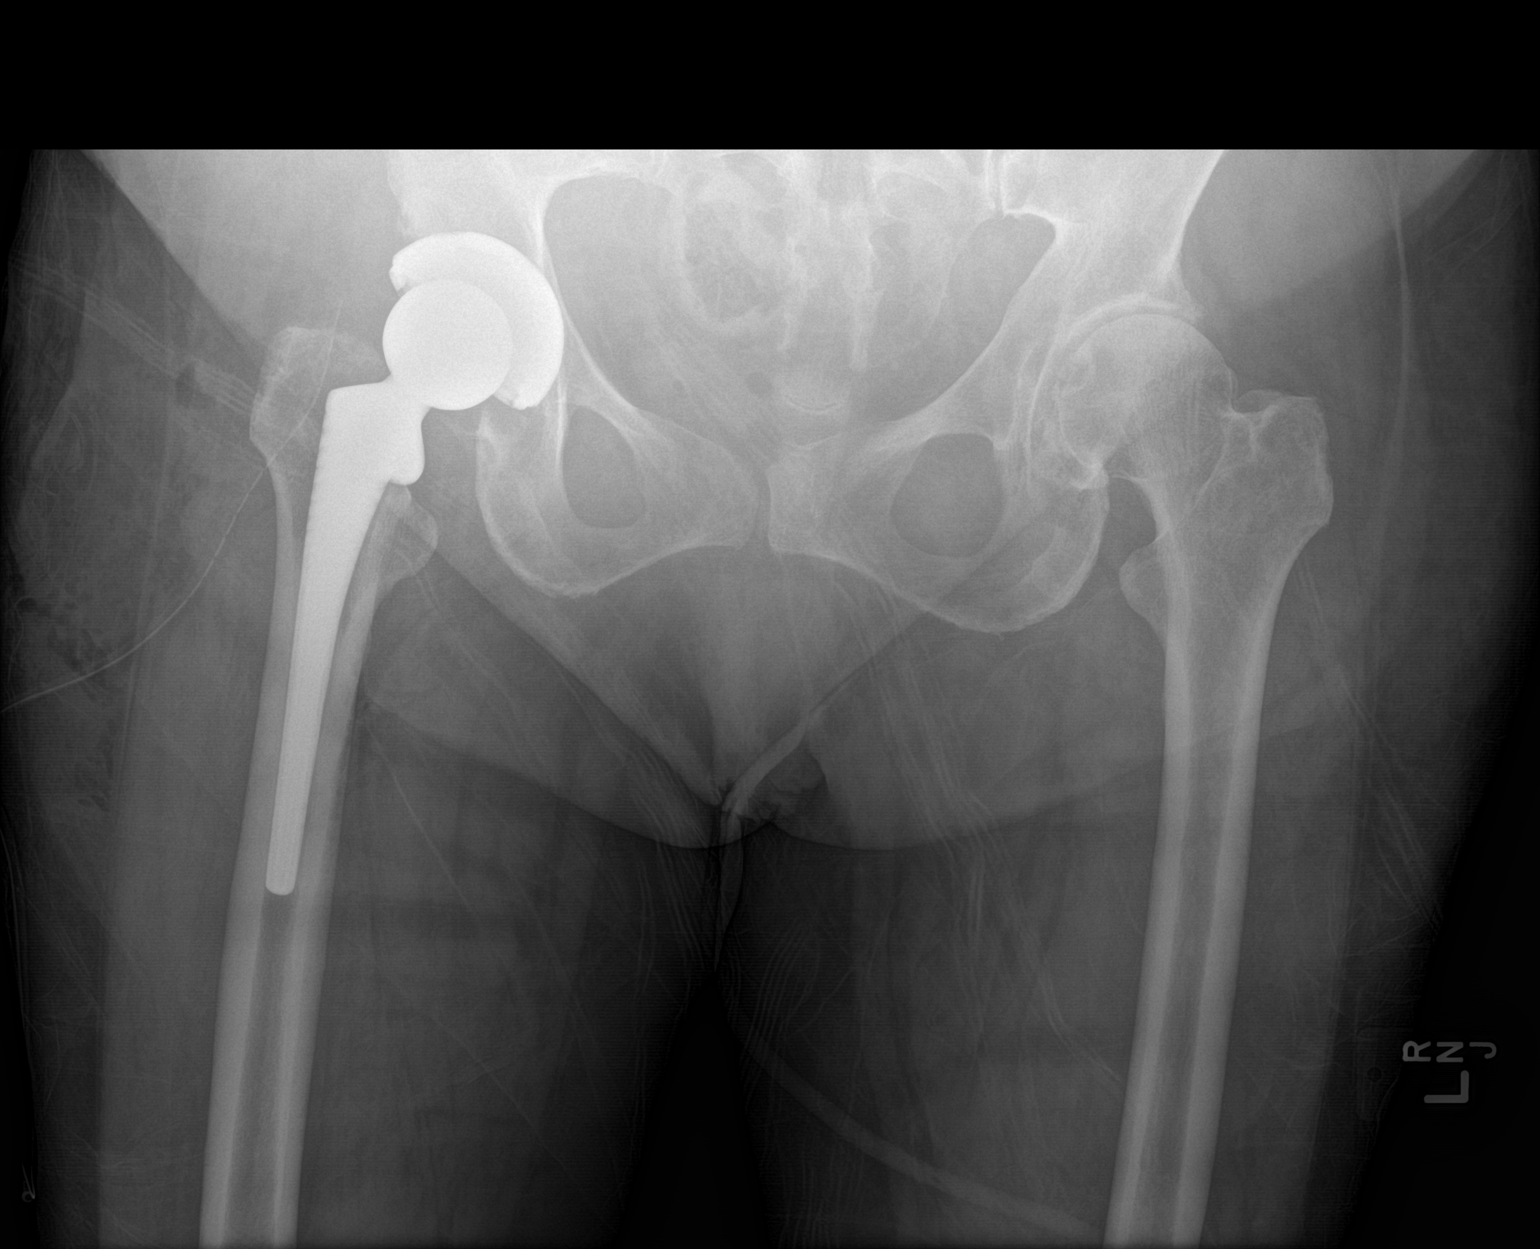

[1 of 1 positions shown; findings below may reference images not displayed]

FINDINGS: Portable AP view at 1551 hours. Bipolar type right hip arthroplasty
hardware in place. Postoperative drain in place. Hardware appears
intact, and normally aligned in the AP view. No unexpected osseous
changes identified.
IMPRESSION: Right hip arthroplasty with no adverse features.

## 2019-04-08 ENCOUNTER — Other Ambulatory Visit: Payer: Self-pay | Admitting: Obstetrics and Gynecology

## 2019-04-08 DIAGNOSIS — R928 Other abnormal and inconclusive findings on diagnostic imaging of breast: Secondary | ICD-10-CM

## 2019-04-12 ENCOUNTER — Other Ambulatory Visit: Payer: Self-pay

## 2019-04-12 ENCOUNTER — Ambulatory Visit
Admission: RE | Admit: 2019-04-12 | Discharge: 2019-04-12 | Disposition: A | Discharge status: Home / Self Care | Payer: Medicare Other | Source: Ambulatory Visit | Attending: Obstetrics and Gynecology | Admitting: Obstetrics and Gynecology

## 2019-04-12 ENCOUNTER — Ambulatory Visit: Payer: Medicare Other

## 2019-04-12 DIAGNOSIS — R928 Other abnormal and inconclusive findings on diagnostic imaging of breast: Secondary | ICD-10-CM

## 2019-08-29 ENCOUNTER — Telehealth: Payer: Self-pay | Admitting: *Deleted

## 2019-08-29 NOTE — Telephone Encounter (Signed)

## 2019-09-03 NOTE — Progress Notes (Signed)
Virtual Visit via Telephone Note   This visit type was conducted due to national recommendations for restrictions regarding the COVID-19 Pandemic (e.g. social distancing) in an effort to limit this patient's exposure and mitigate transmission in our community.  Due to her co-morbid illnesses, this patient is at least at moderate risk for complications without adequate follow up.  This format is felt to be most appropriate for this patient at this time.  All issues noted in this document were discussed and addressed.  A limited physical exam was performed with this format.  Please refer to the patient's chart for her consent to telehealth for [facility].   Evaluation Performed:  Follow-up visit  This visit type was conducted due to national recommendations for restrictions regarding the COVID-19 Pandemic (e.g. social distancing).  This format is felt to be most appropriate for this patient at this time.  All issues noted in this document were discussed and addressed.  No physical exam was performed (except for noted visual exam findings with Video Visits).  Please refer to the patient's chart (MyChart message for video visits and phone note for telephone visits) for the patient's consent to telehealth for [facility].  Date:  09/04/2019   ID:  [name], DOB 1951-04-01, MRN [id number]  Patient Location:  Home  Provider location:   [place]  PCP:  Shirline Frees, MD  Sleep medicine: Fransico Him, MD Electrophysiologist:  None   Chief Complaint:  OSA  History of Present Illness:    [name] is a 68 y.o. female who presents via audio/video conferencing for a telehealth visit today.    [name] is a 67 y.o. female with a hx of OSA, obesity and RLS.  She is doing well with her CPAP device and thinks that she has gotten used to it.  She tolerates the nasal mask and feels the pressure is adequate.  Since going on CPAP she feels rested in the am if she sleeps well  the night before and has no significant daytime sleepiness.  She has some orthopedic pain that wakes her up at night.  She denies any significant mouth or nasal dryness or nasal congestion.  She does not think that he snores.    The patient does not have symptoms concerning for COVID-19 infection (fever, chills, cough, or new shortness of breath).   Prior CV studies:   The following studies were reviewed today:  PAP compliance download  Past Medical History:  Diagnosis Date  . Adenomatous polyps   . Anxiety   . Arthritis    low back pain BETTER SINCE HIP SURGERY JAN 2019  . Bradycardia, sinus    with cardiac arrest in setting of respiratory arrest during anesthesia - cardiac workup with echo and nuclear showed no ischemia and normal LVF  . Cancer ([place]) 02/2010   SQUAMOUS CELL  cell skin cancer  . Chronic cough    occasional  . Depression   . 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 Difficult intubation    difficult intubation week  before 02-21-13 surgery, was not intubated for 02-21-13 surgery, but laid flat on stomach   . Difficult intubation    and coded and saw dr Tressia Miners Kole Hilyard and was told she had osa and that caused code  . GERD (gastroesophageal reflux disease)   . Headache(784.0)    migraines occasional  . Hyperlipidemia   . Hypothyroidism   . Multiple allergies  SEASONAL  . OSA (obstructive sleep apnea)    on CPAP setting of 13  . PONV (postoperative nausea and vomiting)    has to use scop patch, coded during 2013/03/04 surgery on left foot with dr hewitt, sleep apnea found  . RLS (restless legs syndrome)   . Tachycardia, paroxysmal (HCC)    sinus tachycardia  . Wears glasses    Past Surgical History:  Procedure Laterality Date  . ABDOMINAL HYSTERECTOMY  1988   partial  . APPENDECTOMY     with hyst  . BIOPSY N/A 02/02/2018   Procedure: BIOPSY;  Surgeon: Ronald Lobo, MD;  [facility]: [facility];  Service:  Endoscopy;  Laterality: N/A;  . COLONOSCOPY     x3  . COLONOSCOPY WITH PROPOFOL N/A 11/04/2014   Procedure: COLONOSCOPY WITH PROPOFOL;  Surgeon: Cleotis Nipper, MD;  [facility];  Service: Endoscopy;  Laterality: N/A;  . COLONOSCOPY WITH PROPOFOL N/A 02/02/2018   Procedure: COLONOSCOPY WITH PROPOFOL;  Surgeon: Ronald Lobo, MD;  [facility];  Service: Endoscopy;  Laterality: N/A;  . ELBOW BURSA SURGERY  2012   lt  . GASTROCNEMIUS RECESSION Left 04-Mar-2013   Procedure: GASTROCNEMIUS RECESSION;  Surgeon: Wylene Simmer, MD;  Location: [place];  Service: Orthopedics;  Laterality: Left;  . [place]  2010   [facility]  . POLYPECTOMY N/A 02/02/2018   Procedure: POLYPECTOMY;  Surgeon: Ronald Lobo, MD;  [facility];  Service: Endoscopy;  Laterality: N/A;  . STRABISMUS SURGERY     age 32 x3  . TOTAL HIP ARTHROPLASTY Right 09/20/2017   Procedure: RIGHT TOTAL HIP ARTHROPLASTY ANTERIOR APPROACH;  Surgeon: Gaynelle Arabian, MD;  [facility];  Service: Orthopedics;  Laterality: Right;  . WISDOM TOOTH EXTRACTION       Current Meds  Medication Sig  . albuterol (PROVENTIL HFA;VENTOLIN HFA) 108 (90 BASE) MCG/ACT inhaler Inhale 1-2 puffs into the lungs every 6 (six) hours as needed for wheezing.   Marland Kitchen aspirin 325 MG tablet Take 325 mg by mouth daily.  Marland Kitchen atenolol (TENORMIN) 25 MG tablet Take 25 mg by mouth every morning.   Marland Kitchen atorvastatin (LIPITOR) 10 MG tablet Take 10 mg by mouth daily.  Marland Kitchen azelastine (ASTELIN) 0.1 % nasal spray Place 1-2 sprays into both nostrils 2 (two) times daily.  . calcium carbonate (TUMS - DOSED IN MG ELEMENTAL CALCIUM) 500 MG chewable tablet Chew 1 tablet by mouth as needed for indigestion or heartburn.  . cetirizine-pseudoephedrine (ZYRTEC-D) 5-120 MG per tablet Take 1 tablet by mouth daily as needed for allergies.   . Cholecalciferol (VITAMIN D) 2000 units tablet Take 4,000 Units by mouth daily.  Marland Kitchen docusate sodium (COLACE) 100  MG capsule Take 100 mg by mouth daily as needed for mild constipation.  Marland Kitchen estradiol (ESTRACE) 1 MG tablet Take 1 mg by mouth daily.  . fluticasone (FLONASE) 50 MCG/ACT nasal spray Place 1 spray into both nostrils daily as needed for allergies or rhinitis.  . fluticasone (FLOVENT HFA) 110 MCG/ACT inhaler Inhale 2 puffs into the lungs 2 (two) times daily as needed (for shortness of breath or wheezing).  Marland Kitchen HYDROcodone-acetaminophen (NORCO) 10-325 MG tablet Take 1 tablet by mouth as needed.  Marland Kitchen ibuprofen (ADVIL,MOTRIN) 200 MG tablet Take 600-800 mg by mouth every 8 (eight) hours as needed. TAKES, 3 PRN   . ketotifen (ZADITOR) 0.025 % ophthalmic solution Place 1 drop into both eyes 2 (two) times daily as needed.  Marland Kitchen levothyroxine (SYNTHROID) 150 MCG tablet Take 150 mcg  by mouth daily.  Marland Kitchen LORazepam (ATIVAN) 2 MG tablet Take 2 mg by mouth 2 (two) times daily as needed for anxiety.  . montelukast (SINGULAIR) 10 MG tablet Take 10 mg by mouth at bedtime.  . Multiple Vitamins-Minerals (MULTIVITAMIN WITH MINERALS) tablet Take 1 tablet by mouth daily.  . Omega-3 Fatty Acids (FISH OIL PO) Take 1,300 mg by mouth daily.  Marland Kitchen rOPINIRole (REQUIP) 1 MG tablet Take 1 mg by mouth at bedtime.  . sertraline (ZOLOFT) 100 MG tablet Take 100 mg by mouth every morning.   . solifenacin (VESICARE) 10 MG tablet Take 10 mg by mouth daily.  . traZODone (DESYREL) 50 MG tablet Take 50 mg by mouth at bedtime as needed.  . zolmitriptan (ZOMIG) 5 MG tablet Take 5 mg by mouth as needed for migraine.     Allergies:   Dilaudid [hydromorphone hcl]   Social History   Tobacco Use  . Smoking status: Current Some Day Smoker    Packs/day: 0.50    Years: 20.00    Pack years: 10.00    Last attempt to quit: 01/21/2013    Years since quitting: 6.6  . Smokeless tobacco: Never Used  Substance Use Topics  . Alcohol use: Yes    Comment: rare  . Drug use: No     Family Hx: The patient's family history includes Arrhythmia in her father;  CAD in her mother; CVA in her father; Cancer - Colon in her paternal uncle and paternal uncle; Colon polyps in her mother; Parkinson's disease in her mother.  ROS:   Please see the history of present illness.     All other systems reviewed and are negative.   Labs/Other Tests and Data Reviewed:    Recent Labs: No results found for requested labs within last 8760 hours.   Recent Lipid Panel No results found for: CHOL, TRIG, HDL, CHOLHDL, LDLCALC, LDLDIRECT  Wt Readings from Last 3 Encounters:  09/04/19 155 lb (70.3 kg)  08/13/18 166 lb (75.3 kg)  05/14/18 164 lb (74.4 kg)     Objective:    Vital Signs:  BP 133/64   Pulse 72   Ht 4\' 11"  (1.499 m)   Wt 155 lb (70.3 kg)   BMI 31.31 kg/m    ASSESSMENT & PLAN:    1.  OSA -  The patient is tolerating PAP therapy well without any problems. The PAP download was reviewed today and showed an AHI of 0.5/hr on 13 cm H2O with 100% compliance in using more than 4 hours nightly.  The patient has been using and benefiting from PAP use and will continue to benefit from therapy.   2.  Obesity -I have encouraged her to get into a routine exercise program and cut back on carbs and portions.   3.  Restless Legs -controlled on Requip 1mg  qhs  COVID-19 Education: The signs and symptoms of COVID-19 were discussed with the patient and how to seek care for testing (follow up with PCP or arrange E-visit).  The importance of social distancing was discussed today.  Patient Risk:   After full review of this patient's clinical status, I feel that they are at least moderate risk at this time.  Time:   Today, I have spent 20 minutes  on telemedicine discussing medical problems including OSA, RLS, obesity.  We also reviewed the symptoms of COVID 19 and the ways to protect against contracting the virus with telehealth technology.  I spent an additional 5 minutes reviewing patient's chart  including PAP compliance download.  Medication Adjustments/Labs  and Tests Ordered: Current medicines are reviewed at length with the patient today.  Concerns regarding medicines are outlined above.  Tests Ordered: No orders of the defined types were placed in this encounter.  Medication Changes: No orders of the defined types were placed in this encounter.   Disposition:  Follow up in 1 year(s)  Signed, Fransico Him, MD  09/04/2019 11:16 AM    [place]

## 2019-09-04 ENCOUNTER — Other Ambulatory Visit: Payer: Self-pay

## 2019-09-04 ENCOUNTER — Encounter: Payer: Self-pay | Admitting: Cardiology

## 2019-09-04 ENCOUNTER — Telehealth (INDEPENDENT_AMBULATORY_CARE_PROVIDER_SITE_OTHER): Payer: Medicare Other | Admitting: Cardiology

## 2019-09-04 VITALS — BP 133/64 | HR 72 | Ht 59.0 in | Wt 155.0 lb

## 2019-09-04 DIAGNOSIS — G4733 Obstructive sleep apnea (adult) (pediatric): Secondary | ICD-10-CM

## 2019-09-04 DIAGNOSIS — G2581 Restless legs syndrome: Secondary | ICD-10-CM | POA: Diagnosis not present

## 2019-09-04 DIAGNOSIS — E669 Obesity, unspecified: Secondary | ICD-10-CM | POA: Diagnosis not present

## 2019-09-04 NOTE — Patient Instructions (Signed)

## 2020-06-05 ENCOUNTER — Other Ambulatory Visit: Payer: Self-pay | Admitting: Orthopedic Surgery

## 2020-06-05 DIAGNOSIS — M25551 Pain in right hip: Secondary | ICD-10-CM

## 2020-06-30 ENCOUNTER — Other Ambulatory Visit: Payer: Self-pay

## 2020-06-30 ENCOUNTER — Ambulatory Visit
Admission: RE | Admit: 2020-06-30 | Discharge: 2020-06-30 | Disposition: A | Discharge status: Home / Self Care | Payer: Medicare Other | Source: Ambulatory Visit | Attending: Orthopedic Surgery | Admitting: Orthopedic Surgery

## 2020-06-30 DIAGNOSIS — M25551 Pain in right hip: Secondary | ICD-10-CM

## 2020-07-01 ENCOUNTER — Other Ambulatory Visit: Payer: Self-pay | Admitting: Orthopedic Surgery

## 2020-07-01 DIAGNOSIS — M25551 Pain in right hip: Secondary | ICD-10-CM

## 2020-07-02 ENCOUNTER — Other Ambulatory Visit: Payer: Self-pay

## 2020-07-02 ENCOUNTER — Ambulatory Visit
Admission: RE | Admit: 2020-07-02 | Discharge: 2020-07-02 | Disposition: A | Discharge status: Home / Self Care | Payer: Medicare Other | Source: Ambulatory Visit | Attending: Orthopedic Surgery | Admitting: Orthopedic Surgery

## 2020-07-02 DIAGNOSIS — M25551 Pain in right hip: Secondary | ICD-10-CM

## 2020-07-13 ENCOUNTER — Other Ambulatory Visit: Payer: Self-pay | Admitting: Orthopedic Surgery

## 2020-07-13 DIAGNOSIS — M25551 Pain in right hip: Secondary | ICD-10-CM

## 2020-07-14 ENCOUNTER — Ambulatory Visit
Admission: RE | Admit: 2020-07-14 | Discharge: 2020-07-14 | Disposition: A | Discharge status: Home / Self Care | Payer: Medicare Other | Source: Ambulatory Visit | Attending: Orthopedic Surgery | Admitting: Orthopedic Surgery

## 2020-07-14 DIAGNOSIS — M25551 Pain in right hip: Secondary | ICD-10-CM

## 2020-07-14 MED ORDER — IOPAMIDOL (ISOVUE-M 200) INJECTION 41%
1.0000 mL | Freq: Once | INTRAMUSCULAR | Status: AC
  Administered 2020-07-14: 1 mL via INTRA_ARTICULAR

## 2020-07-19 LAB — ANAEROBIC AND AEROBIC CULTURE
AER RESULT:: NO GROWTH
GRAM STAIN:: NONE SEEN
MICRO NUMBER:: 11119417
MICRO NUMBER:: 11119418
SPECIMEN QUALITY:: ADEQUATE
SPECIMEN QUALITY:: ADEQUATE

## 2020-07-19 LAB — CELL COUNT + DIFF, W/O CRYST-SYNVL FLD
Basophils, %: 0 %
Eosinophils-Synovial: 0 % (ref 0–2)
Lymphocytes-Synovial Fld: 26 % (ref 0–74)
Monocyte/Macrophage: 24 % (ref 0–69)
Neutrophil, Synovial: 50 % — ABNORMAL HIGH (ref 0–24)
Synoviocytes, %: 0 % (ref 0–15)
WBC, Synovial: 567 cells/uL — ABNORMAL HIGH (ref <150)

## 2020-08-31 ENCOUNTER — Encounter: Payer: Self-pay | Admitting: Cardiology

## 2020-08-31 ENCOUNTER — Other Ambulatory Visit: Payer: Self-pay

## 2020-08-31 ENCOUNTER — Ambulatory Visit (INDEPENDENT_AMBULATORY_CARE_PROVIDER_SITE_OTHER): Payer: Medicare Other | Admitting: Cardiology

## 2020-08-31 VITALS — BP 148/80 | HR 62 | Ht 59.0 in | Wt 161.0 lb

## 2020-08-31 DIAGNOSIS — R030 Elevated blood-pressure reading, without diagnosis of hypertension: Secondary | ICD-10-CM

## 2020-08-31 DIAGNOSIS — G2581 Restless legs syndrome: Secondary | ICD-10-CM | POA: Diagnosis not present

## 2020-08-31 DIAGNOSIS — E669 Obesity, unspecified: Secondary | ICD-10-CM | POA: Diagnosis not present

## 2020-08-31 DIAGNOSIS — G4733 Obstructive sleep apnea (adult) (pediatric): Secondary | ICD-10-CM | POA: Diagnosis not present

## 2020-08-31 NOTE — Progress Notes (Signed)
Date:  08/31/2020   ID:  [name], DOB 10/28/1950, MRN [id number]   PCP:  Shirline Frees, MD  Sleep medicine: Fransico Him, MD Electrophysiologist:  None   Chief Complaint:  OSA  History of Present Illness:    [name] is a 69 y.o. female with a hx of OSA, obesity and RLS.  She is doing well with her CPAP device and thinks that she has gotten used to it.  She tolerates the mask and feels the pressure is adequate.  Since going on CPAP she feels rested in the am and has no significant daytime sleepiness if she slept well the night before.  Sometimes she has problems with pain in her hip that will wake her up.  She denies any significant mouth or nasal dryness or nasal congestion. Sometimes her lips are dry in the am.   She does not think that he snores.    Prior CV studies:   The following studies were reviewed today:  PAP compliance download, EKG  Past Medical History:  Diagnosis Date  . Adenomatous polyps   . Anxiety   . Arthritis    low back pain BETTER SINCE HIP SURGERY JAN 2019  . Bradycardia, sinus    with cardiac arrest in setting of respiratory arrest during anesthesia - cardiac workup with echo and nuclear showed no ischemia and normal LVF  . Cancer ([place]) 02/2010   SQUAMOUS CELL  cell skin cancer  . Chronic cough    occasional  . Depression   . 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 Difficult intubation    difficult intubation week  before March 13, 2013 surgery, was not intubated for 03/13/13 surgery, but laid flat on stomach   . Difficult intubation    and coded and saw dr Tressia Miners Sofia Vanmeter and was told she had osa and that caused code  . GERD (gastroesophageal reflux disease)   . Headache(784.0)    migraines occasional  . Hyperlipidemia   . Hypothyroidism   . Multiple allergies    SEASONAL  . OSA (obstructive sleep apnea)    on CPAP setting of 13  . PONV (postoperative nausea and vomiting)    has to use  scop patch, coded during 2013/03/13 surgery on left foot with dr hewitt, sleep apnea found  . RLS (restless legs syndrome)   . Tachycardia, paroxysmal (HCC)    sinus tachycardia  . Wears glasses    Past Surgical History:  Procedure Laterality Date  . ABDOMINAL HYSTERECTOMY  1988   partial  . APPENDECTOMY     with hyst  . BIOPSY N/A 02/02/2018   Procedure: BIOPSY;  Surgeon: Ronald Lobo, MD;  [facility];  Service: Endoscopy;  Laterality: N/A;  . COLONOSCOPY     x3  . COLONOSCOPY WITH PROPOFOL N/A 11/04/2014   Procedure: COLONOSCOPY WITH PROPOFOL;  Surgeon: Cleotis Nipper, MD;  [facility];  Service: Endoscopy;  Laterality: N/A;  . COLONOSCOPY WITH PROPOFOL N/A 02/02/2018   Procedure: COLONOSCOPY WITH PROPOFOL;  Surgeon: Ronald Lobo, MD;  [facility];  Service: Endoscopy;  Laterality: N/A;  . ELBOW BURSA SURGERY  2012   lt  . GASTROCNEMIUS RECESSION Left 2013/03/13   Procedure: GASTROCNEMIUS RECESSION;  Surgeon: Wylene Simmer, MD;  Location: [place];  Service: Orthopedics;  Laterality: Left;  . [place]  2010   [facility]  . POLYPECTOMY N/A 02/02/2018   Procedure: POLYPECTOMY;  Surgeon: Ronald Lobo, MD;  [facility];  Service: Endoscopy;  Laterality: N/A;  . STRABISMUS SURGERY     age 18 x3  . TOTAL HIP ARTHROPLASTY Right 09/20/2017   Procedure: RIGHT TOTAL HIP ARTHROPLASTY ANTERIOR APPROACH;  Surgeon: Gaynelle Arabian, MD;  [facility];  Service: Orthopedics;  Laterality: Right;  . WISDOM TOOTH EXTRACTION       Current Meds  Medication Sig  . albuterol (PROVENTIL HFA;VENTOLIN HFA) 108 (90 BASE) MCG/ACT inhaler Inhale 1-2 puffs into the lungs every 6 (six) hours as needed for wheezing.   Marland Kitchen aspirin 325 MG tablet Take 325 mg by mouth daily.  Marland Kitchen atenolol (TENORMIN) 25 MG tablet Take 25 mg by mouth every morning.   Marland Kitchen atorvastatin (LIPITOR) 10 MG tablet Take 10 mg by mouth daily.  Marland Kitchen azelastine (ASTELIN) 0.1 % nasal  spray Place 1-2 sprays into both nostrils 2 (two) times daily.  . calcium carbonate (TUMS - DOSED IN MG ELEMENTAL CALCIUM) 500 MG chewable tablet Chew 1 tablet by mouth as needed for indigestion or heartburn.  . cetirizine-pseudoephedrine (ZYRTEC-D) 5-120 MG per tablet Take 1 tablet by mouth daily as needed for allergies.   . Cholecalciferol (VITAMIN D) 2000 units tablet Take 4,000 Units by mouth daily.  Marland Kitchen docusate sodium (COLACE) 100 MG capsule Take 100 mg by mouth daily as needed for mild constipation.  Marland Kitchen estradiol (ESTRACE) 1 MG tablet Take 1 mg by mouth daily.  . fluticasone (FLONASE) 50 MCG/ACT nasal spray Place 1 spray into both nostrils daily as needed for allergies or rhinitis.  . fluticasone (FLOVENT HFA) 110 MCG/ACT inhaler Inhale 2 puffs into the lungs 2 (two) times daily as needed (for shortness of breath or wheezing).  Marland Kitchen HYDROcodone-acetaminophen (NORCO) 10-325 MG tablet Take 1 tablet by mouth as needed.  Marland Kitchen ibuprofen (ADVIL,MOTRIN) 200 MG tablet Take 600-800 mg by mouth every 8 (eight) hours as needed. TAKES, 3 PRN  . ketotifen (ZADITOR) 0.025 % ophthalmic solution Place 1 drop into both eyes 2 (two) times daily as needed.  Marland Kitchen levothyroxine (SYNTHROID) 150 MCG tablet Take 150 mcg by mouth daily.  Marland Kitchen LORazepam (ATIVAN) 2 MG tablet Take 2 mg by mouth 2 (two) times daily as needed for anxiety.  . montelukast (SINGULAIR) 10 MG tablet Take 10 mg by mouth at bedtime.  . Multiple Vitamins-Minerals (MULTIVITAMIN WITH MINERALS) tablet Take 1 tablet by mouth daily.  . Omega-3 Fatty Acids (FISH OIL PO) Take 1,300 mg by mouth daily.  Marland Kitchen rOPINIRole (REQUIP) 1 MG tablet Take 1 mg by mouth at bedtime.  . sertraline (ZOLOFT) 100 MG tablet Take 100 mg by mouth every morning.   . solifenacin (VESICARE) 10 MG tablet Take 10 mg by mouth daily.  . traZODone (DESYREL) 50 MG tablet Take 50 mg by mouth at bedtime as needed.  . zolmitriptan (ZOMIG) 5 MG tablet Take 5 mg by mouth as needed for migraine.      Allergies:   Dilaudid [hydromorphone hcl]   Social History   Tobacco Use  . Smoking status: Current Some Day Smoker    Packs/day: 0.50    Years: 20.00    Pack years: 10.00    Last attempt to quit: 01/21/2013    Years since quitting: 7.6  . Smokeless tobacco: Never Used  Vaping Use  . Vaping Use: Never used  Substance Use Topics  . Alcohol use: Yes    Comment: rare  . Drug use: No     Family Hx: The patient's family history includes Arrhythmia in her  father; CAD in her mother; CVA in her father; Cancer - Colon in her paternal uncle and paternal uncle; Colon polyps in her mother; Parkinson's disease in her mother.  ROS:   Please see the history of present illness.     All other systems reviewed and are negative.   Labs/Other Tests and Data Reviewed:    Recent Labs: No results found for requested labs within last 8760 hours.   Recent Lipid Panel No results found for: CHOL, TRIG, HDL, CHOLHDL, LDLCALC, LDLDIRECT  Wt Readings from Last 3 Encounters:  08/31/20 161 lb (73 kg)  09/04/19 155 lb (70.3 kg)  08/13/18 166 lb (75.3 kg)    EKG was performed in the office today and showed NSR with septal infarct and no ST changes.  Objective:    Vital Signs:  BP (!) 148/80   Pulse 62   Ht 4\' 11"  (1.499 m)   Wt 161 lb (73 kg)   SpO2 96%   BMI 32.52 kg/m    GEN: Well nourished, well developed in no acute distress HEENT: Normal NECK: No JVD; No carotid bruits LYMPHATICS: No lymphadenopathy CARDIAC:RRR, no murmurs, rubs, gallops RESPIRATORY:  Clear to auscultation without rales, wheezing or rhonchi  ABDOMEN: Soft, non-tender, non-distended MUSCULOSKELETAL:  No edema; No deformity  SKIN: Warm and dry NEUROLOGIC:  Alert and oriented x 3 PSYCHIATRIC:  Normal affect    ASSESSMENT & PLAN:    1.  OSA -  The patient is tolerating PAP therapy well without any problems. The PAP download was reviewed today and showed an AHI of 0.5/hr on 13 cm H2O with 93% compliance in using  more than 4 hours nightly.  The patient has been using and benefiting from PAP use and will continue to benefit from therapy.   2.  Obesity -exercise is limited by her hip -I have encouraged her to get into a routine exercise program and cut back on carbs and portions.   3.  Restless Legs -this is very well controlled on Requip 1mg  qhs  4.  Elevated BP with no hx of HTN -I have asked her to check her BP daily for a week and call with results   Medication Adjustments/Labs and Tests Ordered: Current medicines are reviewed at length with the patient today.  Concerns regarding medicines are outlined above.  Tests Ordered: Orders Placed This Encounter  Procedures  . EKG 12-Lead   Medication Changes: No orders of the defined types were placed in this encounter.   Disposition:  Follow up in 1 year(s)  Signed, Fransico Him, MD  08/31/2020 9:45 AM    [place]

## 2020-08-31 NOTE — Patient Instructions (Signed)
Please take your blood pressure daily for one week and call us or send a MyChart message with a list of your readings.   Medication Instructions:  Your physician recommends that you continue on your current medications as directed. Please refer to the Current Medication list given to you today.  *If you need a refill on your cardiac medications before your next appointment, please call your pharmacy*  Follow-Up: At [facility], you and your health needs are our priority.  As part of our continuing mission to provide you with exceptional heart care, we have created designated Provider Care Teams.  These Care Teams include your primary Cardiologist (physician) and Advanced Practice Providers (APPs -  Physician Assistants and Nurse Practitioners) who all work together to provide you with the care you need, when you need it.  We recommend signing up for the patient portal called "MyChart".  Sign up information is provided on this After Visit Summary.  MyChart is used to connect with patients for Virtual Visits (Telemedicine).  Patients are able to view lab/test results, encounter notes, upcoming appointments, etc.  Non-urgent messages can be sent to your provider as well.   To learn more about what you can do with MyChart, go to [email or web address].    Your next appointment:   1 year(s)  The format for your next appointment:   In Person  Provider:   You may see Fransico Him, MD or one of the following Advanced Practice Providers on your designated Care Team:    Melina Copa, PA-C  Ermalinda Barrios, PA-C

## 2020-11-11 ENCOUNTER — Telehealth: Payer: Self-pay | Admitting: Cardiology

## 2020-11-11 NOTE — Telephone Encounter (Signed)
BP and HR look good

## 2020-11-11 NOTE — Telephone Encounter (Signed)
Patient called to give Dr. Radford Pax and nurse her BP and pulse readings:  141/71; 87 138/68; 59 139/63; 62 138/65; 69 139/74; 81 138/67; 61 139/72; 60

## 2020-11-12 NOTE — Telephone Encounter (Signed)
Left message for patient letting her know that her HR and BP look good. Advised to call back with any questions.

## 2021-05-03 ENCOUNTER — Other Ambulatory Visit: Payer: Self-pay | Admitting: Orthopedic Surgery

## 2021-05-03 ENCOUNTER — Other Ambulatory Visit (HOSPITAL_COMMUNITY): Payer: Self-pay | Admitting: Orthopedic Surgery

## 2021-05-03 DIAGNOSIS — Z96641 Presence of right artificial hip joint: Secondary | ICD-10-CM

## 2021-05-04 ENCOUNTER — Other Ambulatory Visit: Payer: Self-pay | Admitting: Gastroenterology

## 2021-05-11 ENCOUNTER — Other Ambulatory Visit: Payer: Self-pay

## 2021-05-11 ENCOUNTER — Encounter (HOSPITAL_COMMUNITY)
Admission: RE | Admit: 2021-05-11 | Discharge: 2021-05-11 | Disposition: A | Discharge status: Home / Self Care | Payer: Medicare Other | Source: Ambulatory Visit | Attending: Orthopedic Surgery | Admitting: Orthopedic Surgery

## 2021-05-11 DIAGNOSIS — Z96641 Presence of right artificial hip joint: Secondary | ICD-10-CM | POA: Insufficient documentation

## 2021-05-11 MED ORDER — TECHNETIUM TC 99M MEDRONATE IV KIT
20.0000 | PACK | Freq: Once | INTRAVENOUS | Status: AC | PRN
  Administered 2021-05-11: 21.3 via INTRAVENOUS

## 2021-05-13 NOTE — Progress Notes (Signed)
Attempted to obtain medical history via telephone, unable to reach at this time. I left a voicemail to return pre surgical testing department's phone call.  

## 2021-05-14 ENCOUNTER — Other Ambulatory Visit: Payer: Self-pay

## 2021-05-20 ENCOUNTER — Encounter (HOSPITAL_COMMUNITY): Payer: Self-pay | Admitting: Gastroenterology

## 2021-05-20 ENCOUNTER — Encounter (HOSPITAL_COMMUNITY)
Admission: RE | Disposition: A | Discharge status: Home / Self Care | Payer: Self-pay | Source: Home / Self Care | Attending: Gastroenterology

## 2021-05-20 ENCOUNTER — Ambulatory Visit (HOSPITAL_COMMUNITY)
Admission: RE | Admit: 2021-05-20 | Discharge: 2021-05-20 | Disposition: A | Discharge status: Home / Self Care | Payer: Medicare Other | Attending: Gastroenterology | Admitting: Gastroenterology

## 2021-05-20 ENCOUNTER — Ambulatory Visit (HOSPITAL_COMMUNITY): Payer: Medicare Other | Admitting: Registered Nurse

## 2021-05-20 ENCOUNTER — Other Ambulatory Visit: Payer: Self-pay

## 2021-05-20 DIAGNOSIS — Z79890 Hormone replacement therapy (postmenopausal): Secondary | ICD-10-CM | POA: Insufficient documentation

## 2021-05-20 DIAGNOSIS — Z8674 Personal history of sudden cardiac arrest: Secondary | ICD-10-CM | POA: Insufficient documentation

## 2021-05-20 DIAGNOSIS — E785 Hyperlipidemia, unspecified: Secondary | ICD-10-CM | POA: Insufficient documentation

## 2021-05-20 DIAGNOSIS — Z8249 Family history of ischemic heart disease and other diseases of the circulatory system: Secondary | ICD-10-CM | POA: Diagnosis not present

## 2021-05-20 DIAGNOSIS — G4733 Obstructive sleep apnea (adult) (pediatric): Secondary | ICD-10-CM | POA: Diagnosis not present

## 2021-05-20 DIAGNOSIS — Z90711 Acquired absence of uterus with remaining cervical stump: Secondary | ICD-10-CM | POA: Diagnosis not present

## 2021-05-20 DIAGNOSIS — Z85828 Personal history of other malignant neoplasm of skin: Secondary | ICD-10-CM | POA: Insufficient documentation

## 2021-05-20 DIAGNOSIS — Z79899 Other long term (current) drug therapy: Secondary | ICD-10-CM | POA: Diagnosis not present

## 2021-05-20 DIAGNOSIS — Z96641 Presence of right artificial hip joint: Secondary | ICD-10-CM | POA: Diagnosis not present

## 2021-05-20 DIAGNOSIS — Z800 Family history of malignant neoplasm of digestive organs: Secondary | ICD-10-CM | POA: Insufficient documentation

## 2021-05-20 DIAGNOSIS — F17200 Nicotine dependence, unspecified, uncomplicated: Secondary | ICD-10-CM | POA: Insufficient documentation

## 2021-05-20 DIAGNOSIS — Z7982 Long term (current) use of aspirin: Secondary | ICD-10-CM | POA: Insufficient documentation

## 2021-05-20 DIAGNOSIS — D123 Benign neoplasm of transverse colon: Secondary | ICD-10-CM | POA: Insufficient documentation

## 2021-05-20 DIAGNOSIS — K5730 Diverticulosis of large intestine without perforation or abscess without bleeding: Secondary | ICD-10-CM | POA: Diagnosis not present

## 2021-05-20 DIAGNOSIS — E039 Hypothyroidism, unspecified: Secondary | ICD-10-CM | POA: Diagnosis not present

## 2021-05-20 DIAGNOSIS — Z885 Allergy status to narcotic agent status: Secondary | ICD-10-CM | POA: Diagnosis not present

## 2021-05-20 DIAGNOSIS — Z8719 Personal history of other diseases of the digestive system: Secondary | ICD-10-CM | POA: Diagnosis not present

## 2021-05-20 DIAGNOSIS — Z1211 Encounter for screening for malignant neoplasm of colon: Secondary | ICD-10-CM | POA: Insufficient documentation

## 2021-05-20 HISTORY — PX: COLONOSCOPY WITH PROPOFOL: SHX5780

## 2021-05-20 HISTORY — PX: POLYPECTOMY: SHX5525

## 2021-05-20 SURGERY — COLONOSCOPY WITH PROPOFOL
Anesthesia: Monitor Anesthesia Care

## 2021-05-20 MED ORDER — PROPOFOL 500 MG/50ML IV EMUL
INTRAVENOUS | Status: DC | PRN
  Administered 2021-05-20: 75 ug/kg/min via INTRAVENOUS

## 2021-05-20 MED ORDER — PROPOFOL 1000 MG/100ML IV EMUL
INTRAVENOUS | Status: AC
  Filled 2021-05-20: qty 100

## 2021-05-20 MED ORDER — ONDANSETRON HCL 4 MG/2ML IJ SOLN
INTRAMUSCULAR | Status: DC | PRN
  Administered 2021-05-20: 4 mg via INTRAVENOUS

## 2021-05-20 MED ORDER — PROPOFOL 10 MG/ML IV BOLUS
INTRAVENOUS | Status: DC | PRN
  Administered 2021-05-20 (×2): 50 mg via INTRAVENOUS

## 2021-05-20 MED ORDER — LACTATED RINGERS IV SOLN: INTRAVENOUS | Status: DC

## 2021-05-20 SURGICAL SUPPLY — 22 items

## 2021-05-20 NOTE — Transfer of Care (Addendum)
Immediate Anesthesia Transfer of Care Note  Patient: [name]  Procedure(s) Performed: COLONOSCOPY WITH PROPOFOL POLYPECTOMY  Patient Location: PACU  Anesthesia Type:MAC  Level of Consciousness: awake, alert , oriented and patient cooperative  Airway & Oxygen Therapy: Patient Spontanous Breathing and Patient connected to face mask oxygen  Post-op Assessment: Report given to RN, Post -op Vital signs reviewed and stable and Patient moving all extremities X 4  Post vital signs: stable  Last Vitals:  Vitals Value Taken Time  BP 93/36 05/20/21 0947  Temp    Pulse 62 05/20/21 0948  Resp 24 05/20/21 0948  SpO2 100 % 05/20/21 0948  Vitals shown include unvalidated device data.  Last Pain:  Vitals:   05/20/21 0827  TempSrc: Oral  PainSc: 0-No pain         Complications: No notable events documented.

## 2021-05-20 NOTE — Anesthesia Preprocedure Evaluation (Signed)
Anesthesia Evaluation  Patient identified by MRN, date of birth, ID band Patient awake    Reviewed: Allergy & Precautions, NPO status , Patient's Chart, lab work & pertinent test results  History of Anesthesia Complications (+) PONV and DIFFICULT AIRWAY  Airway Mallampati: III  TM Distance: <3 FB Neck ROM: Full    Dental no notable dental hx.    Pulmonary sleep apnea and Continuous Positive Airway Pressure Ventilation , Current Smoker and Patient abstained from smoking.,    Pulmonary exam normal breath sounds clear to auscultation       Cardiovascular negative cardio ROS Normal cardiovascular exam Rhythm:Regular Rate:Normal     Neuro/Psych negative neurological ROS  negative psych ROS   GI/Hepatic Neg liver ROS, GERD  ,  Endo/Other  Hypothyroidism   Renal/GU negative Renal ROS  negative genitourinary   Musculoskeletal negative musculoskeletal ROS (+)   Abdominal   Peds negative pediatric ROS (+)  Hematology negative hematology ROS (+)   Anesthesia Other Findings   Reproductive/Obstetrics negative OB ROS                             Anesthesia Physical Anesthesia Plan  ASA: 3  Anesthesia Plan: MAC   Post-op Pain Management:    Induction: Intravenous  PONV Risk Score and Plan: 3 and Propofol infusion and Treatment may vary due to age or medical condition  Airway Management Planned: Simple Face Mask  Additional Equipment:   Intra-op Plan:   Post-operative Plan:   Informed Consent: I have reviewed the patients History and Physical, chart, labs and discussed the procedure including the risks, benefits and alternatives for the proposed anesthesia with the patient or authorized representative who has indicated his/her understanding and acceptance.     Dental advisory given  Plan Discussed with: CRNA and Surgeon  Anesthesia Plan Comments:         Anesthesia Quick  Evaluation

## 2021-05-20 NOTE — Anesthesia Procedure Notes (Signed)
Procedure Name: MAC Date/Time: 05/20/2021 9:12 AM Performed by: Lissa Morales, CRNA Pre-anesthesia Checklist: Patient identified, Emergency Drugs available, Suction available and Patient being monitored Patient Re-evaluated:Patient Re-evaluated prior to induction Oxygen Delivery Method: Simple face mask Placement Confirmation: positive ETCO2

## 2021-05-20 NOTE — H&P (Signed)
[name] is an 70 y.o. female.   Chief Complaint: History of colon polyps, family history of colon cancer HPI: This 70 year old female has a family history of colon cancer in her mother (around age 11) and a personal history of multiple small colonic adenomatous polyps having been removed, including several on her most recent colonoscopy in May 2019.  Her last procedure was done at the hospital because of a history of bradycardic cardiac arrest in association with an orthopedic procedure back in 2014, possibly related to sleep apnea.  I believe there may also be a history of a difficult intubation.  She has not had any anesthesia problems prior to or subsequent to that event.  She has occasional constipation but no rectal bleeding or any significant change in bowel habits.  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   right hemidiaphragm elevation   Difficult intubation    difficult intubation week  before March 17, 2013 surgery, was not intubated for 17-Mar-2013 surgery, but laid flat on stomach    Difficult intubation    and coded and saw dr Tressia Miners turner and was told she had osa and that caused code   GERD (gastroesophageal reflux disease)    Headache(784.0)    migraines occasional   Hyperlipidemia    Hypothyroidism    Multiple allergies    SEASONAL   OSA (obstructive sleep apnea)    on CPAP setting of 13   PONV (postoperative nausea and vomiting)    has to use scop patch, coded during 03/17/13 surgery on left foot with dr hewitt, sleep apnea found   RLS (restless legs  syndrome)    Tachycardia, paroxysmal (HCC)    sinus tachycardia   Wears glasses     Past Surgical History:  Procedure Laterality Date   ABDOMINAL HYSTERECTOMY  1988   partial   APPENDECTOMY     with hyst   BIOPSY N/A 02/02/2018   Procedure: BIOPSY;  Surgeon: Ronald Lobo, MD;  Location: [facility];  Service: Endoscopy;  Laterality: N/A;   COLONOSCOPY     x3   COLONOSCOPY WITH PROPOFOL N/A 11/04/2014   Procedure: COLONOSCOPY WITH PROPOFOL;  Surgeon: Cleotis Nipper, MD;  Location: [facility];  Service: Endoscopy;  Laterality: N/A;   COLONOSCOPY WITH PROPOFOL N/A 02/02/2018   Procedure: COLONOSCOPY WITH PROPOFOL;  Surgeon: Ronald Lobo, MD;  Location: [facility];  Service: Endoscopy;  Laterality: N/A;   ELBOW BURSA SURGERY  2012   lt   GASTROCNEMIUS RECESSION Left March 17, 2013   Procedure: GASTROCNEMIUS RECESSION;  Surgeon: Wylene Simmer, MD;  Location: [facility];  Service: Orthopedics;  Laterality: Left;   HEMORRHOID SURGERY  2010   [place]   POLYPECTOMY N/A 02/02/2018   Procedure: POLYPECTOMY;  Surgeon: Ronald Lobo, MD;  Location: [facility];  Service: Endoscopy;  Laterality: N/A;   STRABISMUS SURGERY     age 46 x3   TOTAL HIP ARTHROPLASTY Right 09/20/2017   Procedure: RIGHT TOTAL HIP ARTHROPLASTY ANTERIOR APPROACH;  Surgeon: Wynelle Link,  Pilar Plate, MD;  Location: [facility];  Service: Orthopedics;  Laterality: Right;   WISDOM TOOTH EXTRACTION      Family History  Problem Relation Age of Onset   Colon polyps Mother    Parkinson's disease Mother    CAD Mother    Arrhythmia Father        afib   CVA Father    Cancer - Colon Paternal Uncle    Cancer - Colon Paternal Uncle    Social History:  reports that she has been smoking. She has a 10.00 pack-year smoking history. She has never used smokeless tobacco. She reports current alcohol use. She reports that she does not use drugs.  Allergies:  Allergies  Allergen Reactions   Dilaudid [Hydromorphone Hcl] Nausea And  Vomiting and Other (See Comments)    Tachycardia, HOT MOUTH    Medications Prior to Admission  Medication Sig Dispense Refill   albuterol (PROVENTIL HFA;VENTOLIN HFA) 108 (90 BASE) MCG/ACT inhaler Inhale 2 puffs into the lungs every 6 (six) hours as needed for wheezing.     aspirin 325 MG tablet Take 325 mg by mouth daily.     atenolol (TENORMIN) 25 MG tablet Take 25 mg by mouth every morning.      atorvastatin (LIPITOR) 20 MG tablet Take 20 mg by mouth 2 (two) times a week. Mondays and Fridays     calcium carbonate (TUMS - DOSED IN MG ELEMENTAL CALCIUM) 500 MG chewable tablet Chew 2 tablets by mouth at bedtime as needed for indigestion or heartburn.     cetirizine-pseudoephedrine (ZYRTEC-D) 5-120 MG per tablet Take 1 tablet by mouth daily as needed for allergies.      Cholecalciferol (VITAMIN D) 2000 units tablet Take 4,000 Units by mouth daily.     docusate sodium (COLACE) 100 MG capsule Take 100 mg by mouth at bedtime as needed for mild constipation.     estradiol (ESTRACE) 1 MG tablet Take 1 mg by mouth daily.     fluticasone (FLONASE) 50 MCG/ACT nasal spray Place 2 sprays into both nostrils at bedtime as needed for allergies or rhinitis.     fluticasone (FLOVENT HFA) 110 MCG/ACT inhaler Inhale 2 puffs into the lungs 2 (two) times daily as needed (for shortness of breath or wheezing).     HYDROcodone-acetaminophen (NORCO) 10-325 MG tablet Take 0.5 tablets by mouth at bedtime as needed for moderate pain.     ibuprofen (ADVIL,MOTRIN) 200 MG tablet Take 800 mg by mouth every 8 (eight) hours as needed for moderate pain.     ketotifen (ZADITOR) 0.025 % ophthalmic solution Place 1 drop into both eyes 2 (two) times daily as needed (allergies).     levothyroxine (SYNTHROID) 150 MCG tablet Take 150 mcg by mouth daily.     montelukast (SINGULAIR) 10 MG tablet Take 10 mg by mouth at bedtime.     Multiple Vitamins-Minerals (MULTIVITAMIN WITH MINERALS) tablet Take 1 tablet by mouth daily.     Omega-3  Fatty Acids (FISH OIL PO) Take 1,300 mg by mouth daily.     rOPINIRole (REQUIP) 1 MG tablet Take 1.5 mg by mouth at bedtime.  1   sertraline (ZOLOFT) 100 MG tablet Take 100 mg by mouth every morning.      solifenacin (VESICARE) 10 MG tablet Take 10 mg by mouth daily.     zolmitriptan (ZOMIG) 5 MG tablet Take 5 mg by mouth as needed for migraine.      No results found for this or any previous visit (  from the past 48 hour(s)). No results found.  Review of Systems  Blood pressure (!) 135/40, temperature 98.8 F (37.1 C), temperature source Oral, height '4\' 11"'$  (1.499 m), weight 70.8 kg, SpO2 95 %. Physical Exam pleasant, cognitively intact, no pallor or icterus, no distress.  Chest clear, heart normal, abdomen moderately obese but nontender  Assessment/Plan Proceed to surveillance colonoscopy because of prior history of multiple colonic adenomatous polyps, as well as for screening in view of family history of colon cancer.  Cleotis Nipper, MD 05/20/2021, 8:57 AM

## 2021-05-20 NOTE — Discharge Instructions (Signed)
YOU HAD AN ENDOSCOPIC PROCEDURE TODAY: Refer to the procedure report and other information in the discharge instructions given to you for any specific questions about what was found during the examination. If this information does not answer your questions, please call [facility] office at [phone] to clarify.   YOU SHOULD EXPECT: Some feelings of bloating in the abdomen. Passage of more gas than usual. Walking can help get rid of the air that was put into your GI tract during the procedure and reduce the bloating. If you had a lower endoscopy (such as a colonoscopy or flexible sigmoidoscopy) you may notice spotting of blood in your stool or on the toilet paper. Some abdominal soreness may be present for a day or two, also.  DIET: Your first meal following the procedure should be a light meal and then it is ok to progress to your normal diet. A half-sandwich or bowl of soup is an example of a good first meal. Heavy or fried foods are harder to digest and may make you feel nauseous or bloated. Drink plenty of fluids but you should avoid alcoholic beverages for 24 hours. If you had a esophageal dilation, please see attached instructions for diet.    ACTIVITY: Your care partner should take you home directly after the procedure. You should plan to take it easy, moving slowly for the rest of the day. You can resume normal activity the day after the procedure however YOU SHOULD NOT DRIVE, use power tools, machinery or perform tasks that involve climbing or major physical exertion for 24 hours (because of the sedation medicines used during the test).   SYMPTOMS TO REPORT IMMEDIATELY: A gastroenterologist can be reached at any hour. Please call [phone]  for any of the following symptoms:  Following lower endoscopy (colonoscopy, flexible sigmoidoscopy) Excessive amounts of blood in the stool  Significant tenderness, worsening of abdominal pains  Swelling of the abdomen that is new, acute  Fever of 100  or higher  Following upper endoscopy (EGD, EUS, ERCP, esophageal dilation) Vomiting of blood or coffee ground material  New, significant abdominal pain  New, significant chest pain or pain under the shoulder blades  Painful or persistently difficult swallowing  New shortness of breath  [place], tarry-looking or red, bloody stools  FOLLOW UP:  If any biopsies were taken you will be contacted by phone or by letter within the next 1-3 weeks. Call [phone]  if you have not heard about the biopsies in 3 weeks.  Please also call with any specific questions about appointments or follow up tests. YOU HAD AN ENDOSCOPIC PROCEDURE TODAY: Refer to the procedure report and other information in the discharge instructions given to you for any specific questions about what was found during the examination. If this information does not answer your questions, please call [facility] office at [phone] to clarify.   YOU SHOULD EXPECT: Some feelings of bloating in the abdomen. Passage of more gas than usual. Walking can help get rid of the air that was put into your GI tract during the procedure and reduce the bloating. If you had a lower endoscopy (such as a colonoscopy or flexible sigmoidoscopy) you may notice spotting of blood in your stool or on the toilet paper. Some abdominal soreness may be present for a day or two, also.  DIET: Your first meal following the procedure should be a light meal and then it is ok to progress to your normal diet. A half-sandwich or bowl of soup is an   example of a good first meal. Heavy or fried foods are harder to digest and may make you feel nauseous or bloated. Drink plenty of fluids but you should avoid alcoholic beverages for 24 hours. If you had a esophageal dilation, please see attached instructions for diet.    ACTIVITY: Your care partner should take you home directly after the procedure. You should plan to take it easy, moving slowly for the rest of the day. You can resume  normal activity the day after the procedure however YOU SHOULD NOT DRIVE, use power tools, machinery or perform tasks that involve climbing or major physical exertion for 24 hours (because of the sedation medicines used during the test).   SYMPTOMS TO REPORT IMMEDIATELY: A gastroenterologist can be reached at any hour. Please call [phone]  for any of the following symptoms:  Following lower endoscopy (colonoscopy, flexible sigmoidoscopy) Excessive amounts of blood in the stool  Significant tenderness, worsening of abdominal pains  Swelling of the abdomen that is new, acute  Fever of 100 or higher   [place], tarry-looking or red, bloody stools  FOLLOW UP:  If any biopsies were taken you will be contacted by phone or by letter within the next 1-3 weeks. Call [phone]  if you have not heard about the biopsies in 3 weeks.  Please also call with any specific questions about appointments or follow up tests.

## 2021-05-20 NOTE — Anesthesia Postprocedure Evaluation (Signed)
Anesthesia Post Note  Patient: [name]  Procedure(s) Performed: COLONOSCOPY WITH PROPOFOL POLYPECTOMY     Patient location during evaluation: PACU Anesthesia Type: MAC Level of consciousness: awake and alert Pain management: pain level controlled Vital Signs Assessment: post-procedure vital signs reviewed and stable Respiratory status: spontaneous breathing, nonlabored ventilation, respiratory function stable and patient connected to nasal cannula oxygen Cardiovascular status: stable and blood pressure returned to baseline Postop Assessment: no apparent nausea or vomiting Anesthetic complications: no   No notable events documented.  Last Vitals:  Vitals:   05/20/21 0950 05/20/21 1000  BP: (!) 98/36 (!) 106/35  Pulse: (!) 59 60  Resp: (!) 25 14  Temp:    SpO2: 100% 100%    Last Pain:  Vitals:   05/20/21 1000  TempSrc:   PainSc: 0-No pain                 Sidharth Leverette S

## 2021-05-20 NOTE — Op Note (Signed)
[facility] Patient Name: [name] Procedure Date: 05/20/2021 MRN: [id number] Attending MD: Ronald Lobo , MD Date of Birth: Oct 25, 1950 CSN: [id number] Age: 70 Admit Type: Outpatient Procedure:                Colonoscopy Indications:              Screening in patient at increased risk: Colorectal                            cancer in mother 54 or older, High risk colon                            cancer surveillance: Personal history of multiple                            (3 or more) adenomas, Last colonoscopy: May 2019                            Procedure done at hospital because of previous                            history of BRADYCARDIC CARDIAC ARREST following an                            orthopedic procedure, also ? history of difficult                            intubation. Providers:                Ronald Lobo, MD, Mariana Arn, [place],                            Enrigue Catena, CRNA Referring MD:              Medicines:                Propofol per Anesthesia Complications:            No immediate complications. Estimated Blood Loss:     Estimated blood loss was minimal. Procedure:                Pre-Anesthesia Assessment:                           - Prior to the procedure, a History and Physical                            was performed, and patient medications and                            allergies were reviewed. The patient's tolerance of                            previous anesthesia was also reviewed. The risks                            and benefits of  the procedure and the sedation                            options and risks were discussed with the patient.                            All questions were answered, and informed consent                            was obtained. Prior Anticoagulants: The patient has                            taken no previous anticoagulant or antiplatelet                            agents. ASA Grade Assessment:  III - A patient with                            severe systemic disease. After reviewing the risks                            and benefits, the patient was deemed in                            satisfactory condition to undergo the procedure.                           After obtaining informed consent, the colonoscope                            was passed under direct vision. Throughout the                            procedure, the patient's blood pressure, pulse, and                            oxygen saturations were monitored continuously. The                            [id number]) Olympus colonoscope was                            introduced through the anus and advanced to the the                            cecum, identified by appendiceal orifice and                            ileocecal valve. The colonoscopy was somewhat                            difficult due to significant looping. Successful  completion of the procedure was aided by changing                            the patient to a supine position. The patient                            tolerated the procedure well. The quality of the                            bowel preparation was excellent. Scope In: 9:18:53 AM Scope Out: 9:40:12 AM Scope Withdrawal Time: 0 hours 8 minutes 27 seconds  Total Procedure Duration: 0 hours 21 minutes 19 seconds  Findings:      The perianal and digital rectal examinations were normal.      A few small-mouthed diverticula were found in the entire colon.      A 3 mm polyp was found in the transverse colon. The polyp was sessile.       Biopsies were taken with a cold forceps for histology. Excision appeared       to be complete.      No other significant abnormalities were identified in a careful       examination of the remainder of the colon. The proximal colon was       re-examined.      Retroflexion was not performed due to a small rectal ampulla but        antegrade viewing and reinspection disclosed no evidence of proctitis or       polyps. Impression:               - Diverticulosis in the entire examined colon.                           - One 3 mm polyp in the transverse colon. Biopsied. Moderate Sedation:      This patient was sedated with monitored anesthesia care, not moderate       sedation. Recommendation:           - Await pathology results.                           - Repeat colonoscopy in 5 years for surveillance. Procedure Code(s):        --- Professional ---                           [phone], Colonoscopy, flexible; with biopsy, single                            or multiple Diagnosis Code(s):        --- Professional ---                           Z80.0, Family history of malignant neoplasm of                            digestive organs                           Z86.010, Personal history of colonic polyps  K63.5, Polyp of colon CPT copyright 2019 American [employer] Association. All rights reserved. The codes documented in this report are preliminary and upon coder review may  be revised to meet current compliance requirements. Ronald Lobo, MD 05/20/2021 10:06:35 AM This report has been signed electronically. Number of Addenda: 0

## 2021-05-21 LAB — SURGICAL PATHOLOGY

## 2021-05-25 ENCOUNTER — Encounter (HOSPITAL_COMMUNITY): Payer: Self-pay | Admitting: Gastroenterology

## 2021-05-26 ENCOUNTER — Telehealth: Payer: Self-pay | Admitting: *Deleted

## 2021-05-26 NOTE — Telephone Encounter (Signed)
   [facility] Pre-operative Risk Assessment    Patient Name: [name]  DOB: 24-Feb-1951 MRN: [id number]  HEARTCARE STAFF:  - IMPORTANT!!!!!! Under Visit Info/Reason for Call, type in Other and utilize the format Clearance MM/DD/YY or Clearance TBD. Do not use dashes or single digits. - Please review there is not already an duplicate clearance open for this procedure. - If request is for dental extraction, please clarify the # of teeth to be extracted. - If the patient is currently at the dentist's office, call Pre-Op Callback Staff (MA/nurse) to input urgent request.  - If the patient is not currently in the dentist office, please route to the Pre-Op pool.  Request for surgical clearance:  What type of surgery is being performed? RIGHT HIP ACETABULAR vs THA REVISION  When is this surgery scheduled? 07/14/21  What type of clearance is required (medical clearance vs. Pharmacy clearance to hold med vs. Both)? MEDICAL  Are there any medications that need to be held prior to surgery and how long?  ASA   Practice name and name of physician performing surgery? [facility]; DR. Gaynelle Arabian  What is the office phone number? [phone]   7.   What is the office fax number? [phone]  8.   Anesthesia type (None, local, MAC, general) ? CHOICE   Julaine Hua 05/26/2021, 3:23 PM  _________________________________________________________________   (provider comments below)

## 2021-05-27 NOTE — Telephone Encounter (Addendum)
   Name: [name]  DOB: 11-15-1950  MRN: [id number]   Primary Cardiologist: Fransico Him, MD  Chart reviewed as part of pre-operative protocol coverage. Patient was contacted 05/27/2021 in reference to pre-operative risk assessment for pending surgery as outlined below.  [name] was last seen on 08/31/20 by Dr. Radford Pax.  Since that day, [name] has done well.  She sees cardiology for sleep apnea. She was last seen 08/31/20 by Dr. Radford Pax and was doing well. She does not have a history of ischemic heart disease, but does have a history of suspected respiratory arrest during an orthopedic surgery in 2014. Follow up testing not suggestive of schemia.   She takes 325 mg ASA, prescribed by PCP. No stroke history on Epic. Please reach out to PCP for ASA hold.   Therefore, based on ACC/AHA guidelines, the patient would be at acceptable risk for the planned procedure without further cardiovascular testing.   The patient was advised that if she develops new symptoms prior to surgery to contact our office to arrange for a follow-up visit, and she verbalized understanding.  I will route this recommendation to the requesting party via Epic fax function and remove from pre-op pool. Please call with questions.  Tami Lin Lithzy Bernard, PA 05/27/2021, 10:41 AM     Pt does not have a history of ischemic heart disease. She sees cardiology for sleep apnea. She was last seen 08/31/20 by Dr. Radford Pax and was doing well.   She takes 325 mg ASA. No stroke history on Epic.   Pt needs a phone call.

## 2021-06-29 NOTE — Patient Instructions (Addendum)
DUE TO COVID-19 ONLY ONE VISITOR IS ALLOWED TO COME WITH YOU AND STAY IN THE WAITING ROOM ONLY DURING PRE OP AND PROCEDURE DAY OF SURGERY IF YOU ARE GOING HOME AFTER SURGERY. IF YOU ARE SPENDING THE NIGHT 2 PEOPLE MAY VISIT WITH YOU IN YOUR PRIVATE ROOM AFTER SURGERY UNTIL VISITING  HOURS ARE OVER AT 8:00 PM AND 1 VISITOR CAN SPEND THE NIGHT.   YOU NEED TO HAVE A COVID 19 TEST ON_10/24____THIS TEST MUST BE DONE BEFORE SURGERY,  COVID TESTING SITE  IS LOCATED AT [place], [place]. REMAIN IN YOUR CAR THIS IS A DRIVE UP TEST. AFTER YOUR COVID TEST PLEASE WEAR A MASK OUT IN PUBLIC AND SOCIAL DISTANCE AND [place] YOUR HANDS FREQUENTLY, ALSO ASK ALL YOUR CLOSE CONTACT PERSONS TO WEAR A MASK AND SOCIAL DISTANCE AND [place] THEIR HANDS FREQUENTLY ALSO.               CECLIA KOKER     Your procedure is scheduled on: 07/14/21   Report to [facility] Main  Entrance   Report to admitting at 1:05 PM     Call this number if you have problems the morning of surgery [phone]    No food after midnight.    You may have clear liquid until 12:30 PM    At 12:00 PM drink pre surgery drink.   Nothing by mouth after 12:30 PM  CLEAR LIQUID DIET   Foods Allowed                                                                     Foods Excluded  Coffee and tea, regular and decaf  NO MILK OR CREAMER                                                liquids that you cannot  Plain Jell-O any favor except red or purple                                           see through such as: Fruit ices (not with fruit pulp)                                     milk, soups, orange juice  Iced Popsicles                                    All solid food Carbonated beverages, regular and diet                                    Cranberry, grape and apple juices Sports drinks like Gatorade Lightly seasoned clear broth or consume(fat free) Sugar    BRUSH YOUR TEETH MORNING OF SURGERY AND RINSE YOUR  MOUTH OUT,  NO CHEWING GUM CANDY OR MINTS.     Take these medicines the morning of surgery with A SIP OF WATER: Ropinirole, Atenolol, Levothyroxine Solifenacin  Bring your mask and tubing   Stop taking _ASA 325__________on _10/17_________as instructed by _Dr. [name]____________. Marland Kitchen                                 You may not have any metal on your body including hair pins and              piercings  Do not wear jewelry, make-up, lotions, powders or perfumes, deodorant             Do not wear nail polish on your fingernails.  Do not shave  48 hours prior to surgery.                 Do not bring valuables to the hospital. [place].  Contacts, dentures or bridgework may not be worn into surgery.  Leave suitcase in the car. After surgery it may be brought to your room.               [facility] - Preparing for Surgery Before surgery, you can play an important role.  Because skin is not sterile, your skin needs to be as free of germs as possible.  You can reduce the number of germs on your skin by washing with CHG (chlorahexidine gluconate) soap before surgery.  CHG is an antiseptic cleaner which kills germs and bonds with the skin to continue killing germs even after washing. Please DO NOT use if you have an allergy to CHG or antibacterial soaps.  If your skin becomes reddened/irritated stop using the CHG and inform your nurse when you arrive at Short Stay. Do not shave (including legs and underarms) for at least 48 hours prior to the first CHG shower.  Please follow these instructions carefully:  1.  Shower with CHG Soap the night before surgery and the  morning of Surgery.  2.  If you choose to wash your hair, wash your hair first as usual with your  normal  shampoo.  3.  After you shampoo, rinse your hair and body thoroughly to remove the  shampoo.                            4.  Use CHG as you would any other liquid soap.  You can apply  chg directly  to the skin and wash                       Gently with a scrungie or clean washcloth.  5.  Apply the CHG Soap to your body ONLY FROM THE NECK DOWN.   Do not use on face/ open                           Wound or open sores. Avoid contact with eyes, ears mouth and genitals (private parts).                       Wash face,  Genitals (private parts) with your normal soap.  6.  Wash thoroughly, paying special attention to the area where your surgery  will be performed.  7.  Thoroughly rinse your body with warm water from the neck down.  8.  DO NOT shower/wash with your normal soap after using and rinsing off  the CHG Soap.                9.  Pat yourself dry with a clean towel.            10.  Wear clean pajamas.            11.  Place clean sheets on your bed the night of your first shower and do not  sleep with pets. Day of Surgery : Do not apply any lotions/deodorants the morning of surgery.  Please wear clean clothes to the hospital/surgery center.  FAILURE TO FOLLOW THESE INSTRUCTIONS MAY RESULT IN THE CANCELLATION OF YOUR SURGERY PATIENT SIGNATURE_________________________________  NURSE SIGNATURE__________________________________  ________________________________________________________________________   Adam Phenix  An incentive spirometer is a tool that can help keep your lungs clear and active. This tool measures how well you are filling your lungs with each breath. Taking long deep breaths may help reverse or decrease the chance of developing breathing (pulmonary) problems (especially infection) following: A long period of time when you are unable to move or be active. BEFORE THE PROCEDURE  If the spirometer includes an indicator to show your best effort, your nurse or respiratory therapist will set it to a desired goal. If possible, sit up straight or lean slightly forward. Try not to slouch. Hold the incentive spirometer in an upright  position. INSTRUCTIONS FOR USE  Sit on the edge of your bed if possible, or sit up as far as you can in bed or on a chair. Hold the incentive spirometer in an upright position. Breathe out normally. Place the mouthpiece in your mouth and seal your lips tightly around it. Breathe in slowly and as deeply as possible, raising the piston or the ball toward the top of the column. Hold your breath for 3-5 seconds or for as long as possible. Allow the piston or ball to fall to the bottom of the column. Remove the mouthpiece from your mouth and breathe out normally. Rest for a few seconds and repeat Steps 1 through 7 at least 10 times every 1-2 hours when you are awake. Take your time and take a few normal breaths between deep breaths. The spirometer may include an indicator to show your best effort. Use the indicator as a goal to work toward during each repetition. After each set of 10 deep breaths, practice coughing to be sure your lungs are clear. If you have an incision (the cut made at the time of surgery), support your incision when coughing by placing a pillow or rolled up towels firmly against it. Once you are able to get out of bed, walk around indoors and cough well. You may stop using the incentive spirometer when instructed by your caregiver.  RISKS AND COMPLICATIONS Take your time so you do not get dizzy or light-headed. If you are in pain, you may need to take or ask for pain medication before doing incentive spirometry. It is harder to take a deep breath if you are having pain. AFTER USE Rest and breathe slowly and easily. It can be helpful to keep track of a log of your progress. Your caregiver can provide you with a simple table to help with this. If you are using  the spirometer at home, follow these instructions: [place] IF:  You are having difficultly using the spirometer. You have trouble using the spirometer as often as instructed. Your pain medication is not giving  enough relief while using the spirometer. You develop fever of 100.5 F (38.1 C) or higher. SEEK IMMEDIATE MEDICAL CARE IF:  You cough up bloody sputum that had not been present before. You develop fever of 102 F (38.9 C) or greater. You develop worsening pain at or near the incision site. MAKE SURE YOU:  Understand these instructions. Will watch your condition. Will get help right away if you are not doing well or get worse. Document Released: 01/16/2007 Document Revised: 11/28/2011 Document Reviewed: 03/19/2007 [facility] Patient Information 2014 [place], [place].   ________________________________________________________________________

## 2021-07-01 ENCOUNTER — Other Ambulatory Visit: Payer: Self-pay

## 2021-07-01 ENCOUNTER — Encounter (HOSPITAL_COMMUNITY): Payer: Self-pay

## 2021-07-01 ENCOUNTER — Encounter (HOSPITAL_COMMUNITY)
Admission: RE | Admit: 2021-07-01 | Discharge: 2021-07-01 | Disposition: A | Discharge status: Home / Self Care | Payer: Medicare Other | Source: Ambulatory Visit | Attending: Orthopedic Surgery | Admitting: Orthopedic Surgery

## 2021-07-01 DIAGNOSIS — Z01818 Encounter for other preprocedural examination: Secondary | ICD-10-CM | POA: Diagnosis present

## 2021-07-01 LAB — COMPREHENSIVE METABOLIC PANEL
ALT: 15 U/L (ref 0–44)
AST: 17 U/L (ref 15–41)
Albumin: 4 g/dL (ref 3.5–5.0)
Alkaline Phosphatase: 75 U/L (ref 38–126)
Anion gap: 7 (ref 5–15)
BUN: 8 mg/dL (ref 8–23)
CO2: 29 mmol/L (ref 22–32)
Calcium: 9.6 mg/dL (ref 8.9–10.3)
Chloride: 102 mmol/L (ref 98–111)
Creatinine, Ser: 0.62 mg/dL (ref 0.44–1.00)
GFR, Estimated: >60 mL/min (ref >60)
Glucose, Bld: 96 mg/dL (ref 70–99)
Potassium: 3.5 mmol/L (ref 3.5–5.1)
Sodium: 138 mmol/L (ref 135–145)
Total Bilirubin: 0.5 mg/dL (ref 0.3–1.2)
Total Protein: 7.4 g/dL (ref 6.5–8.1)

## 2021-07-01 LAB — SURGICAL PCR SCREEN
MRSA, PCR: NEGATIVE
Staphylococcus aureus: NEGATIVE

## 2021-07-01 LAB — CBC
HCT: 42.1 % (ref 36.0–46.0)
Hemoglobin: 14.1 g/dL (ref 12.0–15.0)
MCH: 29.6 pg (ref 26.0–34.0)
MCHC: 33.5 g/dL (ref 30.0–36.0)
MCV: 88.3 fL (ref 80.0–100.0)
Platelets: 340 10*3/uL (ref 150–400)
RBC: 4.77 MIL/uL (ref 3.87–5.11)
RDW: 13 % (ref 11.5–15.5)
WBC: 8.3 10*3/uL (ref 4.0–10.5)
nRBC: 0 % (ref 0.0–0.2)

## 2021-07-01 LAB — PROTIME-INR
INR: 1 (ref 0.8–1.2)
Prothrombin Time: 13 seconds (ref 11.4–15.2)

## 2021-07-01 LAB — TYPE AND SCREEN
ABO/RH(D): B NEG
Antibody Screen: NEGATIVE

## 2021-07-01 NOTE — Progress Notes (Signed)
COVID test- 07/12/21  PCP - Dr. Rogers Blocker  LOV 03/11/21-chart Cardiologist - Dr. Darene Lamer. Radford Pax 08/31/20- clearance 05/26/21  Chest x-ray - no EKG - 08/31/20-epic Stress Test - 2014 ECHO - 2018 Cardiac Cath - no Pacemaker/ICD device last checked:NA  Sleep Study - yes CPAP - yes  Fasting Blood Sugar - NA Checks Blood Sugar _____ times a day  Blood Thinner Instructions:ASA 325/ Dr. Rogers Blocker Aspirin Instructions:Stop 7 days prior to DOS/Dr. Wynelle Link Last Dose:07/05/21  Anesthesia review: yes  Patient denies shortness of breath, fever, cough and chest pain at PAT appointment Pt has been intubated several times without a problems but had a complication in [id number]. Pt reports no SOB with any activities  Patient verbalized understanding of instructions that were given to them at the PAT appointment. Patient was also instructed that they will need to review over the PAT instructions again at home before surgery. Yes

## 2021-07-12 ENCOUNTER — Other Ambulatory Visit: Payer: Self-pay | Admitting: Orthopedic Surgery

## 2021-07-13 LAB — SARS CORONAVIRUS 2 (TAT 6-24 HRS): SARS Coronavirus 2: NEGATIVE

## 2021-07-13 NOTE — H&P (Signed)
TOTAL HIP ARTHROPLASTY REVISION ADMISSION H&P  Patient is admitted for right total hip arthroplasty revision.   Subjective:  Chief Complaint: Right hip pain  HPI: [name], 70 y.o. female,comes to clinic for a recheck of her right hip. She was previously underwent aspiration with cultures which did not show infection. She states that the hip is still painful and has progressively got worse since about April. She denies any trauma or incident to cause the pain to regress. She states the pain is constant but is worse at night time. She has been taking advil and hydrocodone prn. She also notes occasionally popping in the right hip.  [name] is a pleasant 70 year old female who comes to the clinic for follow-up of right hip pain. She is accompanied by her husband. The patient reports that her right hip pain never completely resolved. She states that her right hip pain returned in 12/2020. The patient notes that her right hip aspiration was performed in 06/2020. She states that her right hip pain was still present, but it was not as severe as it was prior to the aspiration. The patient notes that her right hip pain returned at the end of 09/2020 and 10/2020. She states that her right hip pain is exacerbated by weight-bearing and lying down at night. The patient localizes her pain across her low back that radiates down her right hip and into the superior aspect of her right lower extremity to her knee. She notes that she has been experiencing clicking in her right hip with ambulation. The patient states that she has been using her walker more at home. She notes that she has been experiencing difficulty standing for prolonged periods of time.  We discussed the patient's presenting complaints, history, and treatment options. My concern is that she may have some acetabular loosening. The unusual thing is that she was fine for 2 years and then recently just started in the past year getting this pain.  The pain is in the groin radiating distally, so it is possible that there is a loose acetabular component. We are going to get a bone scan to see if that is the case. She will come back and see me after the bone scan. We discussed the possible need for revision surgery if the acetabular component is loose.  Patient Active Problem List   Diagnosis Date Noted   OA (osteoarthritis) of hip 09/20/2017   Lower extremity edema 07/25/2017   Obesity (BMI 30-39.9) 10/14/2013   Hypoxia 10/14/2013   RLS (restless legs syndrome)    OSA (obstructive sleep apnea) 09/20/2013    Past Medical History:  Diagnosis Date   Adenomatous polyps    Anxiety    Arthritis    low back pain BETTER SINCE HIP SURGERY JAN 2019   Bradycardia, sinus    with cardiac arrest in setting of respiratory arrest during anesthesia - cardiac workup with echo and nuclear showed no ischemia and normal LVF   Cancer ([place]) 02/2010   SQUAMOUS CELL  cell skin cancer   Depression    Diaphragm anomaly, 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   right hemidiaphragm elevation   Difficult intubation    difficult intubation week  before 02-21-13 surgery, was not intubated for 02-21-13 surgery, but laid flat on stomach    Difficult intubation    and coded and saw dr Tressia Miners turner and was told she had osa and that caused code   GERD (gastroesophageal reflux disease)    Headache(784.0)  migraines occasional   Hyperlipidemia    Hypothyroidism    Multiple allergies    SEASONAL   OSA (obstructive sleep apnea)    on CPAP setting of 13   PONV (postoperative nausea and vomiting)    has to use scop patch, coded during 03-20-13 surgery on left foot with dr hewitt, sleep apnea found   RLS (restless legs syndrome)    Tachycardia, paroxysmal (HCC)    sinus tachycardia takes atenolol   Wears glasses     Past Surgical History:  Procedure Laterality Date   ABDOMINAL HYSTERECTOMY  1988   partial   APPENDECTOMY     with hyst   BIOPSY N/A 02/02/2018    Procedure: BIOPSY;  Surgeon: Ronald Lobo, MD;  Location: [facility];  Service: Endoscopy;  Laterality: N/A;   COLONOSCOPY     x3   COLONOSCOPY WITH PROPOFOL N/A 11/04/2014   Procedure: COLONOSCOPY WITH PROPOFOL;  Surgeon: Cleotis Nipper, MD;  Location: [facility];  Service: Endoscopy;  Laterality: N/A;   COLONOSCOPY WITH PROPOFOL N/A 02/02/2018   Procedure: COLONOSCOPY WITH PROPOFOL;  Surgeon: Ronald Lobo, MD;  Location: [facility];  Service: Endoscopy;  Laterality: N/A;   COLONOSCOPY WITH PROPOFOL N/A 05/20/2021   Procedure: COLONOSCOPY WITH PROPOFOL;  Surgeon: Ronald Lobo, MD;  Location: [facility];  Service: Endoscopy;  Laterality: N/A;   ELBOW BURSA SURGERY  2012   lt   GASTROCNEMIUS RECESSION Left 2013/03/20   Procedure: GASTROCNEMIUS RECESSION;  Surgeon: Wylene Simmer, MD;  Location: [facility];  Service: Orthopedics;  Laterality: Left;   HEMORRHOID SURGERY  2010   [place]   POLYPECTOMY N/A 02/02/2018   Procedure: POLYPECTOMY;  Surgeon: Ronald Lobo, MD;  Location: [facility];  Service: Endoscopy;  Laterality: N/A;   POLYPECTOMY  05/20/2021   Procedure: POLYPECTOMY;  Surgeon: Ronald Lobo, MD;  Location: [facility];  Service: Endoscopy;;   STRABISMUS SURGERY     age 45 x3   TOTAL HIP ARTHROPLASTY Right 09/20/2017   Procedure: RIGHT TOTAL HIP ARTHROPLASTY ANTERIOR APPROACH;  Surgeon: Gaynelle Arabian, MD;  Location: [facility];  Service: Orthopedics;  Laterality: Right;   WISDOM TOOTH EXTRACTION      Prior to Admission medications   Medication Sig Start Date End Date Taking? Authorizing Provider  albuterol (PROVENTIL HFA;VENTOLIN HFA) 108 (90 BASE) MCG/ACT inhaler Inhale 2 puffs into the lungs every 6 (six) hours as needed for wheezing.   Yes [provider]  aspirin 325 MG tablet Take 325 mg by mouth daily.   Yes [provider]  atenolol (TENORMIN) 25 MG tablet Take 25 mg by mouth every morning.    Yes [provider]   atorvastatin (LIPITOR) 20 MG tablet Take 20 mg by mouth 2 (two) times a week. Mondays and Thursday   Yes [provider]  calcium carbonate (TUMS - DOSED IN MG ELEMENTAL CALCIUM) 500 MG chewable tablet Chew 2 tablets by mouth at bedtime as needed for indigestion or heartburn.   Yes [provider]  cetirizine-pseudoephedrine (ZYRTEC-D) 5-120 MG per tablet Take 1 tablet by mouth daily as needed for allergies.    Yes [provider]  Cholecalciferol (VITAMIN D) 2000 units tablet Take 4,000 Units by mouth daily.   Yes [provider]  docusate sodium (COLACE) 100 MG capsule Take 100 mg by mouth at bedtime as needed for mild constipation.   Yes [provider]  estradiol (ESTRACE) 1 MG tablet Take 1 mg by mouth daily.   Yes [provider]  fluticasone (FLONASE) 50 MCG/ACT nasal spray Place 2 sprays into both nostrils at bedtime as needed for allergies or rhinitis.   Yes [provider]  fluticasone (FLOVENT HFA) 110 MCG/ACT inhaler Inhale 2 puffs into the lungs 2 (two) times daily as needed (for shortness of breath or wheezing).   Yes [provider]  HYDROcodone-acetaminophen (NORCO) 10-325 MG tablet Take 0.5-1 tablets by mouth at bedtime.   Yes [provider]  ibuprofen (ADVIL,MOTRIN) 200 MG tablet Take 800 mg by mouth every 8 (eight) hours as needed for moderate pain.   Yes [provider]  ketotifen (ZADITOR) 0.025 % ophthalmic solution Place 1 drop into both eyes 2 (two) times daily as needed (allergies).   Yes [provider]  levothyroxine (SYNTHROID) 150 MCG tablet Take 150 mcg by mouth daily. 08/17/19  Yes [provider]  montelukast (SINGULAIR) 10 MG tablet Take 10 mg by mouth at bedtime.   Yes [provider]  Multiple Vitamins-Minerals (MULTIVITAMIN WITH MINERALS) tablet Take 1 tablet by mouth daily.   Yes [provider]  Omega-3 Fatty Acids (FISH OIL PO) Take  1,300 mg by mouth daily.   Yes [provider]  rOPINIRole (REQUIP) 1 MG tablet Take 1-1.5 mg by mouth at bedtime. 03/06/18  Yes [provider]  solifenacin (VESICARE) 10 MG tablet Take 10 mg by mouth daily.   Yes [provider]  zolmitriptan (ZOMIG) 5 MG tablet Take 5 mg by mouth as needed for migraine.   Yes [provider]  sertraline (ZOLOFT) 100 MG tablet Take 100 mg by mouth every morning.     [provider]    Allergies  Allergen Reactions   Dilaudid [Hydromorphone Hcl] Nausea And Vomiting and Other (See Comments)    Tachycardia, HOT MOUTH    Social History   Socioeconomic History   Marital status: Married    Spouse name: Not on file   Number of children: Not on file   Years of education: Not on file   Highest education level: Not on file  Occupational History   Occupation: retired  Tobacco Use   Smoking status: Every Day    Packs/day: 0.50    Years: 30.00    Pack years: 15.00    Types: Cigarettes   Smokeless tobacco: Never  Vaping Use   Vaping Use: Never used  Substance and Sexual Activity   Alcohol use: Yes    Comment: rare   Drug use: No   Sexual activity: Not on file    Comment: trying to cut down  Other Topics Concern   Not on file  Social History Narrative   Not on file   Social Determinants of Health   Financial Resource Strain: Not on file  Food Insecurity: Not on file  Transportation Needs: Not on file  Physical Activity: Not on file  Stress: Not on file  Social Connections: Not on file  Intimate Partner Violence: Not on file    Tobacco Use: High Risk   Smoking Tobacco Use: Every Day   Smokeless Tobacco Use: Never   Passive Exposure: Not on file   Social History   Substance and Sexual Activity  Alcohol Use Yes   Comment: rare    Family History  Problem Relation Age of Onset   Colon polyps Mother    Parkinson's disease Mother    CAD Mother    Arrhythmia Father        afib   CVA  Father  Cancer - Colon Paternal Uncle    Cancer - Colon Paternal Uncle     ROS: Constitutional: no fever, no chills, no night sweats, no significant weight loss Cardiovascular: no chest pain, no palpitations Respiratory: no cough, no shortness of breath, No COPD Gastrointestinal: no vomiting, no nausea Musculoskeletal: no swelling in Joints, Joint Pain Neurologic: no numbness, no tingling, no difficulty with balance    Objective:  Physical Exam: Well nourished and well developed.  General: Alert and oriented x3, cooperative and pleasant, no acute distress.  Head: normocephalic, atraumatic, neck supple.  Eyes: EOMI.  Respiratory: breath sounds clear in all fields, no wheezing, rales, or rhonchi. Cardiovascular: Regular rate and rhythm, no murmurs, gallops or rubs.  Abdomen: non-tender to palpation and soft, normoactive bowel sounds. Musculoskeletal:  Right Hip Exam:  The range of motion: Flexion to 120 degrees, Internal Rotation to 20 degrees, External Rotation to 30 degrees, and abduction to 30 degrees without discomfort.  There is no tenderness over the greater trochanteric bursa.   The patient's sensation and motor function are intact in their lower extremities. Their distal pulses are 2+. The bilateral calves are soft and non-tender.  Vital signs in last 24 hours:    Imaging Review Radiographs- AP pelvis, AP and lateral of the right hip dated 04/23/2021 demonstrate a prosthesis in good position. I do not notice any definitive periprosthetic abnormalities. However, in comparing the position of her acetabular component now compared to 2019 and 2020, it looks a little more abducted. I do not see any definitive ingrowth into the acetabulum.  Assessment/Plan:  Failed right total hip arthroplasty  The patient history, physical examination, clinical judgement of the provider and imaging studies are consistent with failed right total hip arthroplasty deeming it necessary to  proceed with right hip acetabular vs. Total hip arthroplasty revision. The risks and benefits of total hip arthroplasty revision were presented and reviewed. The risks due to aseptic loosening, infection, stiffness, dislocation/subluxation, thromboembolic complications and other imponderables were discussed. The patient acknowledged the explanation, agreed to proceed with the plan and consent was signed. Patient is being admitted for inpatient treatment for surgery, pain control, PT, OT, prophylactic antibiotics, VTE prophylaxis, progressive ambulation and ADLs and discharge planning.The patient is planning to be discharged  home .    Therapy Plans: HEP Disposition: Home with Husband Planned DVT Prophylaxis: Xarelto 10mg  DME Needed: None, 3-in-1 PCP: Azalia Bilis, MD (clearance received) TXA: IV  Allergies: Dilaudid (irregular heart rate) Anesthesia Concerns: CPAP, Hx of coding during previous achilles tendon surgery where she was placed prone on the operating table.   BMI: 31.4 Last HgbA1c: n/a  Pharmacy: [facility]  Other: - States she stopped Zoloft 06/25/2021   - Patient was instructed on what medications to stop prior to surgery. - Follow-up visit in 2 weeks with Dr. Wynelle Link - Begin physical therapy following surgery - Pre-operative lab work as pre-surgical testing - Prescriptions will be provided in hospital at time of discharge  Fenton Foy, [facility], PA-C Orthopedic Surgery [facility] [place]

## 2021-07-14 ENCOUNTER — Other Ambulatory Visit: Payer: Self-pay

## 2021-07-14 ENCOUNTER — Inpatient Hospital Stay (HOSPITAL_COMMUNITY): Payer: Medicare Other | Admitting: Anesthesiology

## 2021-07-14 ENCOUNTER — Inpatient Hospital Stay (HOSPITAL_COMMUNITY): Payer: Medicare Other

## 2021-07-14 ENCOUNTER — Encounter (HOSPITAL_COMMUNITY): Payer: Self-pay | Admitting: Orthopedic Surgery

## 2021-07-14 ENCOUNTER — Encounter (HOSPITAL_COMMUNITY)
Admission: RE | Disposition: A | Discharge status: Home / Self Care | Payer: Self-pay | Source: Ambulatory Visit | Attending: Orthopedic Surgery

## 2021-07-14 ENCOUNTER — Inpatient Hospital Stay (HOSPITAL_COMMUNITY)
Admission: RE | Admit: 2021-07-14 | Discharge: 2021-07-15 | DRG: 468 | Disposition: A | Discharge status: Home / Self Care | Payer: Medicare Other | Source: Ambulatory Visit | Attending: Orthopedic Surgery | Admitting: Orthopedic Surgery

## 2021-07-14 DIAGNOSIS — K219 Gastro-esophageal reflux disease without esophagitis: Secondary | ICD-10-CM | POA: Diagnosis present

## 2021-07-14 DIAGNOSIS — Z885 Allergy status to narcotic agent status: Secondary | ICD-10-CM | POA: Diagnosis not present

## 2021-07-14 DIAGNOSIS — Z8371 Family history of colonic polyps: Secondary | ICD-10-CM

## 2021-07-14 DIAGNOSIS — Z823 Family history of stroke: Secondary | ICD-10-CM

## 2021-07-14 DIAGNOSIS — Z791 Long term (current) use of non-steroidal anti-inflammatories (NSAID): Secondary | ICD-10-CM

## 2021-07-14 DIAGNOSIS — T84018A Broken internal joint prosthesis, other site, initial encounter: Secondary | ICD-10-CM

## 2021-07-14 DIAGNOSIS — Y792 Prosthetic and other implants, materials and accessory orthopedic devices associated with adverse incidents: Secondary | ICD-10-CM | POA: Diagnosis present

## 2021-07-14 DIAGNOSIS — T84090A Other mechanical complication of internal right hip prosthesis, initial encounter: Secondary | ICD-10-CM | POA: Diagnosis present

## 2021-07-14 DIAGNOSIS — F17210 Nicotine dependence, cigarettes, uncomplicated: Secondary | ICD-10-CM | POA: Diagnosis present

## 2021-07-14 DIAGNOSIS — F32A Depression, unspecified: Secondary | ICD-10-CM | POA: Diagnosis present

## 2021-07-14 DIAGNOSIS — E785 Hyperlipidemia, unspecified: Secondary | ICD-10-CM | POA: Diagnosis present

## 2021-07-14 DIAGNOSIS — E669 Obesity, unspecified: Secondary | ICD-10-CM | POA: Diagnosis present

## 2021-07-14 DIAGNOSIS — Z6830 Body mass index (BMI) 30.0-30.9, adult: Secondary | ICD-10-CM

## 2021-07-14 DIAGNOSIS — Z85828 Personal history of other malignant neoplasm of skin: Secondary | ICD-10-CM

## 2021-07-14 DIAGNOSIS — Z8674 Personal history of sudden cardiac arrest: Secondary | ICD-10-CM

## 2021-07-14 DIAGNOSIS — Z820 Family history of epilepsy and other diseases of the nervous system: Secondary | ICD-10-CM

## 2021-07-14 DIAGNOSIS — Z79890 Hormone replacement therapy (postmenopausal): Secondary | ICD-10-CM

## 2021-07-14 DIAGNOSIS — Z90710 Acquired absence of both cervix and uterus: Secondary | ICD-10-CM | POA: Diagnosis not present

## 2021-07-14 DIAGNOSIS — Z96649 Presence of unspecified artificial hip joint: Secondary | ICD-10-CM

## 2021-07-14 DIAGNOSIS — Z8249 Family history of ischemic heart disease and other diseases of the circulatory system: Secondary | ICD-10-CM

## 2021-07-14 DIAGNOSIS — Z7982 Long term (current) use of aspirin: Secondary | ICD-10-CM

## 2021-07-14 DIAGNOSIS — Z79899 Other long term (current) drug therapy: Secondary | ICD-10-CM

## 2021-07-14 DIAGNOSIS — Z79891 Long term (current) use of opiate analgesic: Secondary | ICD-10-CM

## 2021-07-14 DIAGNOSIS — E039 Hypothyroidism, unspecified: Secondary | ICD-10-CM | POA: Diagnosis present

## 2021-07-14 DIAGNOSIS — G2581 Restless legs syndrome: Secondary | ICD-10-CM | POA: Diagnosis present

## 2021-07-14 DIAGNOSIS — F419 Anxiety disorder, unspecified: Secondary | ICD-10-CM | POA: Diagnosis present

## 2021-07-14 DIAGNOSIS — G4733 Obstructive sleep apnea (adult) (pediatric): Secondary | ICD-10-CM | POA: Diagnosis present

## 2021-07-14 HISTORY — PX: TOTAL HIP REVISION: SHX763

## 2021-07-14 SURGERY — TOTAL HIP REVISION
Anesthesia: Monitor Anesthesia Care | Site: Hip | Laterality: Right

## 2021-07-14 MED ORDER — METOCLOPRAMIDE HCL 5 MG PO TABS: 5.0000 mg | ORAL_TABLET | Freq: Three times a day (TID) | ORAL | Status: DC | PRN

## 2021-07-14 MED ORDER — ORAL CARE MOUTH RINSE: 15.0000 mL | Freq: Once | OROMUCOSAL | Status: AC

## 2021-07-14 MED ORDER — BISACODYL 10 MG RE SUPP: 10.0000 mg | Freq: Every day | RECTAL | Status: DC | PRN

## 2021-07-14 MED ORDER — METOCLOPRAMIDE HCL 5 MG/ML IJ SOLN: 5.0000 mg | Freq: Three times a day (TID) | INTRAMUSCULAR | Status: DC | PRN

## 2021-07-14 MED ORDER — BUDESONIDE 0.25 MG/2ML IN SUSP
0.2500 mg | Freq: Two times a day (BID) | RESPIRATORY_TRACT | Status: DC
Start: 2021-07-14 — End: 2021-07-15
  Administered 2021-07-14 – 2021-07-15 (×2): 0.25 mg via RESPIRATORY_TRACT
  Filled 2021-07-14 (×2): qty 2

## 2021-07-14 MED ORDER — ALBUTEROL SULFATE (2.5 MG/3ML) 0.083% IN NEBU: 3.0000 mL | INHALATION_SOLUTION | Freq: Four times a day (QID) | RESPIRATORY_TRACT | Status: DC | PRN

## 2021-07-14 MED ORDER — PHENOL 1.4 % MT LIQD: 1.0000 | OROMUCOSAL | Status: DC | PRN

## 2021-07-14 MED ORDER — ESTRADIOL 1 MG PO TABS
1.0000 mg | ORAL_TABLET | Freq: Every day | ORAL | Status: DC
  Administered 2021-07-15: 1 mg via ORAL
  Filled 2021-07-14 (×2): qty 1

## 2021-07-14 MED ORDER — 0.9 % SODIUM CHLORIDE (POUR BTL) OPTIME
TOPICAL | Status: DC | PRN
  Administered 2021-07-14: 1000 mL

## 2021-07-14 MED ORDER — ACETAMINOPHEN 10 MG/ML IV SOLN
INTRAVENOUS | Status: AC
  Filled 2021-07-14: qty 100

## 2021-07-14 MED ORDER — ATORVASTATIN CALCIUM 20 MG PO TABS
20.0000 mg | ORAL_TABLET | ORAL | Status: DC
  Administered 2021-07-15: 20 mg via ORAL
  Filled 2021-07-14: qty 1

## 2021-07-14 MED ORDER — FENTANYL CITRATE (PF) 100 MCG/2ML IJ SOLN
INTRAMUSCULAR | Status: AC
  Filled 2021-07-14: qty 2

## 2021-07-14 MED ORDER — PHENYLEPHRINE HCL-NACL 20-0.9 MG/250ML-% IV SOLN
INTRAVENOUS | Status: DC | PRN
  Administered 2021-07-14: 30 ug/min via INTRAVENOUS

## 2021-07-14 MED ORDER — METHOCARBAMOL 500 MG PO TABS
500.0000 mg | ORAL_TABLET | Freq: Four times a day (QID) | ORAL | Status: DC | PRN
  Administered 2021-07-14 – 2021-07-15 (×4): 500 mg via ORAL
  Filled 2021-07-14 (×4): qty 1

## 2021-07-14 MED ORDER — DEXAMETHASONE SODIUM PHOSPHATE 10 MG/ML IJ SOLN
10.0000 mg | Freq: Once | INTRAMUSCULAR | Status: AC
  Administered 2021-07-15: 10 mg via INTRAVENOUS
  Filled 2021-07-14: qty 1

## 2021-07-14 MED ORDER — POLYETHYLENE GLYCOL 3350 17 G PO PACK: 17.0000 g | PACK | Freq: Every day | ORAL | Status: DC | PRN

## 2021-07-14 MED ORDER — ONDANSETRON HCL 4 MG/2ML IJ SOLN
INTRAMUSCULAR | Status: AC
  Filled 2021-07-14: qty 2

## 2021-07-14 MED ORDER — PROMETHAZINE HCL 25 MG/ML IJ SOLN
INTRAMUSCULAR | Status: AC
  Filled 2021-07-14: qty 1

## 2021-07-14 MED ORDER — CEFAZOLIN SODIUM-DEXTROSE 2-4 GM/100ML-% IV SOLN
2.0000 g | INTRAVENOUS | Status: AC
  Administered 2021-07-14: 2 g via INTRAVENOUS
  Filled 2021-07-14: qty 100

## 2021-07-14 MED ORDER — PROPOFOL 10 MG/ML IV BOLUS
INTRAVENOUS | Status: DC | PRN
  Administered 2021-07-14: 20 mg via INTRAVENOUS
  Administered 2021-07-14 (×2): 10 mg via INTRAVENOUS

## 2021-07-14 MED ORDER — OXYCODONE HCL 5 MG PO TABS
5.0000 mg | ORAL_TABLET | ORAL | Status: DC | PRN
  Administered 2021-07-14 – 2021-07-15 (×3): 5 mg via ORAL
  Filled 2021-07-14 (×3): qty 1

## 2021-07-14 MED ORDER — ONDANSETRON HCL 4 MG PO TABS: 4.0000 mg | ORAL_TABLET | Freq: Four times a day (QID) | ORAL | Status: DC | PRN

## 2021-07-14 MED ORDER — SODIUM CHLORIDE 0.9 % IV SOLN: INTRAVENOUS | Status: DC

## 2021-07-14 MED ORDER — MEPERIDINE HCL 50 MG/ML IJ SOLN: 6.2500 mg | INTRAMUSCULAR | Status: DC | PRN

## 2021-07-14 MED ORDER — ROPINIROLE HCL 1 MG PO TABS
1.0000 mg | ORAL_TABLET | Freq: Every day | ORAL | Status: DC
  Administered 2021-07-14: 1 mg via ORAL
  Filled 2021-07-14: qty 1

## 2021-07-14 MED ORDER — SODIUM CHLORIDE 0.9 % IR SOLN
Status: DC | PRN
  Administered 2021-07-14: 1000 mL

## 2021-07-14 MED ORDER — LEVOTHYROXINE SODIUM 75 MCG PO TABS
150.0000 ug | ORAL_TABLET | Freq: Every day | ORAL | Status: DC
  Administered 2021-07-15: 150 ug via ORAL
  Filled 2021-07-14: qty 2

## 2021-07-14 MED ORDER — PROPOFOL 10 MG/ML IV BOLUS
INTRAVENOUS | Status: AC
  Filled 2021-07-14: qty 20

## 2021-07-14 MED ORDER — MIDAZOLAM HCL 5 MG/5ML IJ SOLN
INTRAMUSCULAR | Status: DC | PRN
  Administered 2021-07-14 (×2): 1 mg via INTRAVENOUS

## 2021-07-14 MED ORDER — DEXAMETHASONE SODIUM PHOSPHATE 10 MG/ML IJ SOLN
INTRAMUSCULAR | Status: AC
  Filled 2021-07-14: qty 1

## 2021-07-14 MED ORDER — PROMETHAZINE HCL 25 MG/ML IJ SOLN
INTRAMUSCULAR | Status: DC | PRN
  Administered 2021-07-14 (×2): 6.25 mg via INTRAVENOUS

## 2021-07-14 MED ORDER — MENTHOL 3 MG MT LOZG: 1.0000 | LOZENGE | OROMUCOSAL | Status: DC | PRN

## 2021-07-14 MED ORDER — PROPOFOL 500 MG/50ML IV EMUL
INTRAVENOUS | Status: AC
  Filled 2021-07-14: qty 50

## 2021-07-14 MED ORDER — MONTELUKAST SODIUM 10 MG PO TABS
10.0000 mg | ORAL_TABLET | Freq: Every day | ORAL | Status: DC
  Administered 2021-07-14: 10 mg via ORAL
  Filled 2021-07-14: qty 1

## 2021-07-14 MED ORDER — RIVAROXABAN 10 MG PO TABS
10.0000 mg | ORAL_TABLET | Freq: Every day | ORAL | Status: DC
  Administered 2021-07-15: 10 mg via ORAL
  Filled 2021-07-14: qty 1

## 2021-07-14 MED ORDER — MIDAZOLAM HCL 2 MG/2ML IJ SOLN
INTRAMUSCULAR | Status: AC
  Filled 2021-07-14: qty 2

## 2021-07-14 MED ORDER — EPHEDRINE 5 MG/ML INJ
INTRAVENOUS | Status: AC
  Filled 2021-07-14: qty 5

## 2021-07-14 MED ORDER — METHOCARBAMOL 500 MG IVPB - SIMPLE MED
500.0000 mg | Freq: Four times a day (QID) | INTRAVENOUS | Status: DC | PRN
  Filled 2021-07-14: qty 50

## 2021-07-14 MED ORDER — CHLORHEXIDINE GLUCONATE 0.12 % MT SOLN
15.0000 mL | Freq: Once | OROMUCOSAL | Status: AC
  Administered 2021-07-14: 15 mL via OROMUCOSAL

## 2021-07-14 MED ORDER — ONDANSETRON HCL 4 MG/2ML IJ SOLN: 4.0000 mg | Freq: Four times a day (QID) | INTRAMUSCULAR | Status: DC | PRN

## 2021-07-14 MED ORDER — LACTATED RINGERS IV SOLN: INTRAVENOUS | Status: DC

## 2021-07-14 MED ORDER — PHENYLEPHRINE HCL (PRESSORS) 10 MG/ML IV SOLN
INTRAVENOUS | Status: AC
  Filled 2021-07-14: qty 2

## 2021-07-14 MED ORDER — OXYCODONE HCL 5 MG PO TABS
5.0000 mg | ORAL_TABLET | Freq: Once | ORAL | Status: DC | PRN
Start: 2021-07-14 — End: 2021-07-14

## 2021-07-14 MED ORDER — DOCUSATE SODIUM 100 MG PO CAPS
100.0000 mg | ORAL_CAPSULE | Freq: Two times a day (BID) | ORAL | Status: DC
  Administered 2021-07-14 – 2021-07-15 (×2): 100 mg via ORAL
  Filled 2021-07-14 (×2): qty 1

## 2021-07-14 MED ORDER — CEFAZOLIN SODIUM-DEXTROSE 2-4 GM/100ML-% IV SOLN
2.0000 g | Freq: Four times a day (QID) | INTRAVENOUS | Status: AC
  Administered 2021-07-14 – 2021-07-15 (×2): 2 g via INTRAVENOUS
  Filled 2021-07-14 (×2): qty 100

## 2021-07-14 MED ORDER — DEXAMETHASONE SODIUM PHOSPHATE 10 MG/ML IJ SOLN
8.0000 mg | Freq: Once | INTRAMUSCULAR | Status: AC
  Administered 2021-07-14: 8 mg via INTRAVENOUS

## 2021-07-14 MED ORDER — PROPOFOL 500 MG/50ML IV EMUL
INTRAVENOUS | Status: DC | PRN
  Administered 2021-07-14: 35 ug/kg/min via INTRAVENOUS

## 2021-07-14 MED ORDER — POVIDONE-IODINE 10 % EX SWAB
2.0000 "application " | Freq: Once | CUTANEOUS | Status: AC
  Administered 2021-07-14: 2 via TOPICAL

## 2021-07-14 MED ORDER — ACETAMINOPHEN 160 MG/5ML PO SOLN: 325.0000 mg | ORAL | Status: DC | PRN

## 2021-07-14 MED ORDER — DEXMEDETOMIDINE (PRECEDEX) IN NS 20 MCG/5ML (4 MCG/ML) IV SYRINGE
PREFILLED_SYRINGE | INTRAVENOUS | Status: DC | PRN
  Administered 2021-07-14: 20 ug via INTRAVENOUS

## 2021-07-14 MED ORDER — TRAMADOL HCL 50 MG PO TABS
50.0000 mg | ORAL_TABLET | Freq: Four times a day (QID) | ORAL | Status: DC | PRN
  Administered 2021-07-14 – 2021-07-15 (×3): 50 mg via ORAL
  Filled 2021-07-14 (×3): qty 1

## 2021-07-14 MED ORDER — DEXMEDETOMIDINE (PRECEDEX) IN NS 20 MCG/5ML (4 MCG/ML) IV SYRINGE
PREFILLED_SYRINGE | INTRAVENOUS | Status: AC
  Filled 2021-07-14: qty 5

## 2021-07-14 MED ORDER — BUPIVACAINE HCL 0.25 % IJ SOLN
INTRAMUSCULAR | Status: DC | PRN
  Administered 2021-07-14: 30 mL

## 2021-07-14 MED ORDER — OXYCODONE HCL 5 MG/5ML PO SOLN: 5.0000 mg | Freq: Once | ORAL | Status: DC | PRN

## 2021-07-14 MED ORDER — BUPIVACAINE IN DEXTROSE 0.75-8.25 % IT SOLN
INTRATHECAL | Status: DC | PRN
  Administered 2021-07-14: 1.6 mL via INTRATHECAL

## 2021-07-14 MED ORDER — ACETAMINOPHEN 10 MG/ML IV SOLN
1000.0000 mg | Freq: Once | INTRAVENOUS | Status: AC
  Administered 2021-07-14: 1000 mg via INTRAVENOUS

## 2021-07-14 MED ORDER — ACETAMINOPHEN 325 MG PO TABS: 325.0000 mg | ORAL_TABLET | Freq: Four times a day (QID) | ORAL | Status: DC | PRN

## 2021-07-14 MED ORDER — ATENOLOL 25 MG PO TABS
25.0000 mg | ORAL_TABLET | Freq: Every morning | ORAL | Status: DC
  Administered 2021-07-15: 25 mg via ORAL
  Filled 2021-07-14: qty 1

## 2021-07-14 MED ORDER — FENTANYL CITRATE (PF) 100 MCG/2ML IJ SOLN
INTRAMUSCULAR | Status: DC | PRN
  Administered 2021-07-14 (×2): 50 ug via INTRAVENOUS

## 2021-07-14 MED ORDER — FLUTICASONE PROPIONATE 50 MCG/ACT NA SUSP: 2.0000 | Freq: Every evening | NASAL | Status: DC | PRN

## 2021-07-14 MED ORDER — ACETAMINOPHEN 325 MG PO TABS: 325.0000 mg | ORAL_TABLET | ORAL | Status: DC | PRN

## 2021-07-14 MED ORDER — TRANEXAMIC ACID-NACL 1000-0.7 MG/100ML-% IV SOLN
1000.0000 mg | INTRAVENOUS | Status: AC
  Administered 2021-07-14: 1000 mg via INTRAVENOUS
  Filled 2021-07-14: qty 100

## 2021-07-14 MED ORDER — DARIFENACIN HYDROBROMIDE ER 7.5 MG PO TB24
7.5000 mg | ORAL_TABLET | Freq: Every day | ORAL | Status: DC
  Administered 2021-07-15: 7.5 mg via ORAL
  Filled 2021-07-14: qty 1

## 2021-07-14 MED ORDER — ONDANSETRON HCL 4 MG/2ML IJ SOLN
4.0000 mg | Freq: Once | INTRAMUSCULAR | Status: DC | PRN
Start: 2021-07-14 — End: 2021-07-14

## 2021-07-14 MED ORDER — FENTANYL CITRATE PF 50 MCG/ML IJ SOSY: 25.0000 ug | PREFILLED_SYRINGE | INTRAMUSCULAR | Status: DC | PRN

## 2021-07-14 SURGICAL SUPPLY — 67 items
BAG COUNTER SPONGE SURGICOUNT (BAG) IMPLANT
BAG DECANTER FOR FLEXI CONT (MISCELLANEOUS) ×2 IMPLANT
BAG SPEC THK2 15X12 ZIP CLS (MISCELLANEOUS) ×2
BAG SPNG CNTER NS LX DISP (BAG)
BAG ZIPLOCK 12X15 (MISCELLANEOUS) ×4 IMPLANT
BIT DRILL 1.6MX128 (BIT) ×1 IMPLANT
BIT DRILL 2.8X128 (BIT) ×2 IMPLANT
BLADE SAW SAG 73X25 THK (BLADE)
BLADE SAW SGTL 73X25 THK (BLADE) IMPLANT
COVER SURGICAL LIGHT HANDLE (MISCELLANEOUS) ×2 IMPLANT
DRAPE INCISE IOBAN 66X45 STRL (DRAPES) ×2 IMPLANT
DRAPE ORTHO SPLIT 77X108 STRL (DRAPES) ×4
DRAPE POUCH INSTRU U-SHP 10X18 (DRAPES) ×2 IMPLANT
DRAPE SURG ORHT 6 SPLT 77X108 (DRAPES) ×2 IMPLANT
DRAPE U-SHAPE 47X51 STRL (DRAPES) ×2 IMPLANT
DRESSING MEPILEX FLEX 4X4 (GAUZE/BANDAGES/DRESSINGS) ×2 IMPLANT
DRSG AQUACEL AG ADV 3.5X10 (GAUZE/BANDAGES/DRESSINGS) ×1 IMPLANT
DRSG EMULSION OIL 3X16 NADH (GAUZE/BANDAGES/DRESSINGS) ×2 IMPLANT
DRSG MEPILEX BORDER 4X8 (GAUZE/BANDAGES/DRESSINGS) ×2 IMPLANT
DRSG MEPILEX FLEX 4X4 (GAUZE/BANDAGES/DRESSINGS) ×4
DURAPREP 26ML APPLICATOR (WOUND CARE) ×2 IMPLANT
ELECT REM PT RETURN 15FT ADLT (MISCELLANEOUS) ×2 IMPLANT
EVACUATOR 1/8 PVC DRAIN (DRAIN) ×2 IMPLANT
FACESHIELD WRAPAROUND (MASK) ×8 IMPLANT
FACESHIELD WRAPAROUND OR TEAM (MASK) ×4 IMPLANT
GAUZE SPONGE 4X4 12PLY STRL (GAUZE/BANDAGES/DRESSINGS) ×2 IMPLANT
GLOVE SRG 8 PF TXTR STRL LF DI (GLOVE) ×1 IMPLANT
GLOVE SURG ENC MOIS LTX SZ6.5 (GLOVE) ×4 IMPLANT
GLOVE SURG ENC MOIS LTX SZ8 (GLOVE) ×4 IMPLANT
GLOVE SURG UNDER POLY LF SZ7 (GLOVE) ×4 IMPLANT
GLOVE SURG UNDER POLY LF SZ8 (GLOVE) ×2
GOWN STRL REUS W/TWL LRG LVL3 (GOWN DISPOSABLE) ×4 IMPLANT
HANDPIECE INTERPULSE COAX TIP (DISPOSABLE)
HEAD FEM STD 32X+5 STRL (Hips) ×1 IMPLANT
IMMOBILIZER KNEE 20 (SOFTGOODS) ×2
IMMOBILIZER KNEE 20 THIGH 36 (SOFTGOODS) IMPLANT
KIT BASIN OR (CUSTOM PROCEDURE TRAY) ×2 IMPLANT
KIT TURNOVER KIT A (KITS) IMPLANT
MANIFOLD NEPTUNE II (INSTRUMENTS) ×2 IMPLANT
NDL SAFETY ECLIPSE 18X1.5 (NEEDLE) ×1 IMPLANT
NEEDLE HYPO 18GX1.5 SHARP (NEEDLE) ×2
NS IRRIG 1000ML POUR BTL (IV SOLUTION) ×2 IMPLANT
PACK TOTAL JOINT (CUSTOM PROCEDURE TRAY) ×2 IMPLANT
PASSER SUT SWANSON 36MM LOOP (INSTRUMENTS) ×1 IMPLANT
PENCIL SMOKE EVACUATOR COATED (MISCELLANEOUS) ×2 IMPLANT
PIN ALTRX NEUTRAL X OD 32X52 (Pin) ×1 IMPLANT
PIN SECTOR W/GRIP ACE CUP 52MM (Hips) ×1 IMPLANT
PROTECTOR NERVE ULNAR (MISCELLANEOUS) ×2 IMPLANT
SCREW 6.5MMX25MM (Screw) ×1 IMPLANT
SCREW 6.5MMX30MM (Screw) ×1 IMPLANT
SET HNDPC FAN SPRY TIP SCT (DISPOSABLE) IMPLANT
SPONGE T-LAP 18X18 ~~LOC~~+RFID (SPONGE) IMPLANT
STAPLER VISISTAT 35W (STAPLE) IMPLANT
SUCTION FRAZIER HANDLE 12FR (TUBING) ×2
SUCTION TUBE FRAZIER 12FR DISP (TUBING) ×1 IMPLANT
SUT ETHIBOND NAB CT1 #1 30IN (SUTURE) ×4 IMPLANT
SUT STRATAFIX 0 PDS 27 VIOLET (SUTURE) ×2
SUT VIC AB 2-0 CT1 27 (SUTURE) ×6
SUT VIC AB 2-0 CT1 TAPERPNT 27 (SUTURE) ×3 IMPLANT
SUTURE STRATFX 0 PDS 27 VIOLET (SUTURE) ×1 IMPLANT
SWAB COLLECTION DEVICE MRSA (MISCELLANEOUS) IMPLANT
SWAB CULTURE ESWAB REG 1ML (MISCELLANEOUS) IMPLANT
SYR 50ML LL SCALE MARK (SYRINGE) ×2 IMPLANT
TAPE STRIPS DRAPE STRL (GAUZE/BANDAGES/DRESSINGS) ×1 IMPLANT
TOWEL OR 17X26 10 PK STRL BLUE (TOWEL DISPOSABLE) ×4 IMPLANT
TRAY FOLEY MTR SLVR 16FR STAT (SET/KITS/TRAYS/PACK) ×2 IMPLANT
WATER STERILE IRR 1000ML POUR (IV SOLUTION) ×4 IMPLANT

## 2021-07-14 NOTE — Discharge Instructions (Addendum)
Dr. Gaynelle Arabian Total Joint Specialist [facility] [address] [place], [place] [address] [phone]  POSTERIOR TOTAL HIP REPLACEMENT POSTOPERATIVE DIRECTIONS  Hip Rehabilitation, Guidelines Following Surgery  The results of a hip operation are greatly improved after range of motion and muscle strengthening exercises. Follow all safety measures which are given to protect your hip. If any of these exercises cause increased pain or swelling in your joint, decrease the amount until you are comfortable again. Then slowly increase the exercises. Call your caregiver if you have problems or questions.   BLOOD CLOT PREVENTION Take a 10 mg Xarelto once a day for three weeks following surgery. Then resume one 325 mg aspirin once a day. You may resume your vitamins/supplements once you have discontinued the Xarelto.  PRECAUTIONS (6 WEEKS FOLLOWING SURGERY) Do not bend your hip past a 90 degree angle Do not cross your legs. Don't twist your hip inwards- keep knees and toes pointed upwards   HOME CARE INSTRUCTIONS  Remove items at home which could result in a fall. This includes throw rugs or furniture in walking pathways.  ICE to the affected hip every three hours for 30 minutes at a time and then as needed for pain and swelling.  Continue to use ice on the hip for pain and swelling from surgery. You may notice swelling that will progress down to the foot and ankle.  This is normal after surgery.  Elevate the leg when you are not up walking on it.   Continue to use the breathing machine which will help keep your temperature down.  It is common for your temperature to cycle up and down following surgery, especially at night when you are not up moving around and exerting yourself.  The breathing machine keeps your lungs expanded and your temperature down.  DIET You may resume your previous home diet once your are discharged from the hospital.  DRESSING / WOUND CARE /  SHOWERING Keep the surgical dressing until follow up.  The dressing is water proof, so you can shower without any extra covering.  IF THE DRESSING FALLS OFF or the wound gets wet inside, change the dressing with sterile gauze.  Please use good hand washing techniques before changing the dressing.  Do not use any lotions or creams on the incision until instructed by your surgeon.   You may start showering once you are discharged home but do not submerge the incision under water. Just pat the incision dry and apply a dry gauze dressing on daily. Change the surgical dressing daily and reapply a dry dressing each time.  ACTIVITY Walk with your walker as instructed. Use walker as long as suggested by your caregivers. Avoid periods of inactivity such as sitting longer than an hour when not asleep. This helps prevent blood clots.  You may resume a sexual relationship in one month or when given the OK by your doctor.  You may return to work once you are cleared by your doctor.  Do not drive a car for 6 weeks or until released by you surgeon.  Do not drive while taking narcotics.  WEIGHT BEARING Weight bearing as tolerated with assist device (walker, cane, etc) as directed, use it as long as suggested by your surgeon or therapist, typically at least 4-6 weeks.  POSTOPERATIVE CONSTIPATION PROTOCOL Constipation - defined medically as fewer than three stools per week and severe constipation as less than one stool per week.  One of the most common issues patients  have following surgery is constipation.  Even if you have a regular bowel pattern at home, your normal regimen is likely to be disrupted due to multiple reasons following surgery.  Combination of anesthesia, postoperative narcotics, change in appetite and fluid intake all can affect your bowels.  In order to avoid complications following surgery, here are some recommendations in order to help you during your recovery period.  Colace (docusate) -  Pick up an over-the-counter form of Colace or another stool softener and take twice a day as long as you are requiring postoperative pain medications.  Take with a full glass of water daily.  If you experience loose stools or diarrhea, hold the colace until you stool forms back up.  If your symptoms do not get better within 1 week or if they get worse, check with your doctor.  Dulcolax (bisacodyl) - Pick up over-the-counter and take as directed by the product packaging as needed to assist with the movement of your bowels.  Take with a full glass of water.  Use this product as needed if not relieved by Colace only.   MiraLax (polyethylene glycol) - Pick up over-the-counter to have on hand.  MiraLax is a solution that will increase the amount of water in your bowels to assist with bowel movements.  Take as directed and can mix with a glass of water, juice, soda, coffee, or tea.  Take if you go more than two days without a movement. Do not use MiraLax more than once per day. Call your doctor if you are still constipated or irregular after using this medication for 7 days in a row.  If you continue to have problems with postoperative constipation, please contact the office for further assistance and recommendations.  If you experience "the worst abdominal pain ever" or develop nausea or vomiting, please contact the office immediatly for further recommendations for treatment.  POST-OPERATIVE OPIOID TAPER INSTRUCTIONS: It is important to wean off of your opioid medication as soon as possible. If you do not need pain medication after your surgery it is ok to stop day one. Opioids include: Codeine, Hydrocodone(Norco, Vicodin), Oxycodone(Percocet, oxycontin) and hydromorphone amongst others.  Long term and even short term use of opiods can cause: Increased pain response Dependence Constipation Depression Respiratory depression And more.  Withdrawal symptoms can include Flu like symptoms Nausea,  vomiting And more Techniques to manage these symptoms Hydrate well Eat regular healthy meals Stay active Use relaxation techniques(deep breathing, meditating, yoga) Do Not substitute Alcohol to help with tapering If you have been on opioids for less than two weeks and do not have pain than it is ok to stop all together.  Plan to wean off of opioids This plan should start within one week post op of your joint replacement. Maintain the same interval or time between taking each dose and first decrease the dose.  Cut the total daily intake of opioids by one tablet each day Next start to increase the time between doses. The last dose that should be eliminated is the evening dose.    ITCHING  If you experience itching with your medications, try taking only a single pain pill, or even half a pain pill at a time.  You can also use Benadryl over the counter for itching or also to help with sleep.   TED HOSE STOCKINGS Wear the elastic stockings on both legs for three weeks following surgery during the day but you may remove then at night for sleeping.  MEDICATIONS See  your medication summary on the "After Visit Summary" that the nursing staff will review with you prior to discharge.  You may have some home medications which will be placed on hold until you complete the course of blood thinner medication.  It is important for you to complete the blood thinner medication as prescribed by your surgeon.  Continue your approved medications as instructed at time of discharge.  PRECAUTIONS If you experience chest pain or shortness of breath - call 911 immediately for transfer to the hospital emergency department.  If you develop a fever greater that 101 F, purulent drainage from wound, increased redness or drainage from wound, foul odor from the wound/dressing, or calf pain - CONTACT YOUR SURGEON.                                                   FOLLOW-UP APPOINTMENTS Make sure you keep all of your  appointments after your operation with your surgeon and caregivers. You should call the office at the above phone number and make an appointment for approximately two weeks after the date of your surgery or on the date instructed by your surgeon outlined in the "After Visit Summary".  RANGE OF MOTION AND STRENGTHENING EXERCISES  These exercises are designed to help you keep full movement of your hip joint. Follow your caregiver's or physical therapist's instructions. Perform all exercises about fifteen times, three times per day or as directed. Exercise both hips, even if you have had only one joint replacement. These exercises can be done on a training (exercise) mat, on the floor, on a table or on a bed. Use whatever works the best and is most comfortable for you. Use music or television while you are exercising so that the exercises are a pleasant break in your day. This will make your life better with the exercises acting as a break in routine you can look forward to.  Lying on your back, slowly slide your foot toward your buttocks, raising your knee up off the floor. Then slowly slide your foot back down until your leg is straight again.  Lying on your back spread your legs as far apart as you can without causing discomfort.  Lying on your side, raise your upper leg and foot straight up from the floor as far as is comfortable. Slowly lower the leg and repeat.  Lying on your back, tighten up the muscle in the front of your thigh (quadriceps muscles). You can do this by keeping your leg straight and trying to raise your heel off the floor. This helps strengthen the largest muscle supporting your knee.  Lying on your back, tighten up the muscles of your buttocks both with the legs straight and with the knee bent at a comfortable angle while keeping your heel on the floor.   IF YOU ARE TRANSFERRED TO A SKILLED REHAB FACILITY If the patient is transferred to a skilled rehab facility following release from  the hospital, a list of the current medications will be sent to the facility for the patient to continue.  When discharged from the skilled rehab facility, please have the facility set up the patient's [place] prior to being released. Also, the skilled facility will be responsible for providing the patient with their medications at time of release from the facility to include their pain  medication, the muscle relaxants, and their blood thinner medication. If the patient is still at the rehab facility at time of the two week follow up appointment, the skilled rehab facility will also need to assist the patient in arranging follow up appointment in our office and any transportation needs.  MAKE SURE YOU:  Understand these instructions.  Get help right away if you are not doing well or get worse.    Pick up stool softner and laxative for home use following surgery while on pain medications. Do not submerge incision under water. Please use good hand washing techniques while changing dressing each day. May shower starting three days after surgery. Please use a clean towel to pat the incision dry following showers. Continue to use ice for pain and swelling after surgery. Do not use any lotions or creams on the incision until instructed by your surgeon.  ==========================================================================  Information on my medicine - XARELTO (Rivaroxaban)  Why was Xarelto prescribed for you? Xarelto was prescribed for you to reduce the risk of blood clots forming after orthopedic surgery. The medical term for these abnormal blood clots is venous thromboembolism (VTE).  What do you need to know about xarelto ? Take your Xarelto ONCE DAILY at the same time every day. You may take it either with or without food.  If you have difficulty swallowing the tablet whole, you may crush it and mix in applesauce just prior to taking your dose.  Take Xarelto  exactly as prescribed by your doctor and DO NOT stop taking Xarelto without talking to the doctor who prescribed the medication.  Stopping without other VTE prevention medication to take the place of Xarelto may increase your risk of developing a clot.  After discharge, you should have regular check-up appointments with your healthcare provider that is prescribing your Xarelto.    What do you do if you miss a dose? If you miss a dose, take it as soon as you remember on the same day then continue your regularly scheduled once daily regimen the next day. Do not take two doses of Xarelto on the same day.   Important Safety Information A possible side effect of Xarelto is bleeding. You should call your healthcare provider right away if you experience any of the following: Bleeding from an injury or your nose that does not stop. Unusual colored urine (red or dark brown) or unusual colored stools (red or [place]). Unusual bruising for unknown reasons. A serious fall or if you hit your head (even if there is no bleeding).  Some medicines may interact with Xarelto and might increase your risk of bleeding while on Xarelto. To help avoid this, consult your healthcare provider or pharmacist prior to using any new prescription or non-prescription medications, including herbals, vitamins, non-steroidal anti-inflammatory drugs (NSAIDs) and supplements.  This website has more information on Xarelto: [email or web address]

## 2021-07-14 NOTE — Anesthesia Postprocedure Evaluation (Signed)
Anesthesia Post Note  Patient: [name]  Procedure(s) Performed: Right hip acetabular vs total hip arthroplasty revision (Right: Hip)     Patient location during evaluation: PACU Anesthesia Type: MAC Level of consciousness: awake and alert Pain management: pain level controlled Vital Signs Assessment: post-procedure vital signs reviewed and stable Respiratory status: spontaneous breathing, nonlabored ventilation, respiratory function stable and patient connected to nasal cannula oxygen Cardiovascular status: stable and blood pressure returned to baseline Postop Assessment: no apparent nausea or vomiting Anesthetic complications: no   No notable events documented.  Last Vitals:  Vitals:   07/14/21 1545 07/14/21 1615  BP: (!) 101/50 (!) 107/55  Pulse: 61 (!) 56  Resp: 18 17  SpO2: 93% 99%    Last Pain:  Vitals:   07/14/21 1615  PainSc: 0-No pain                 Shulamit Donofrio

## 2021-07-14 NOTE — Brief Op Note (Signed)
07/14/2021  3:22 PM  PATIENT:  [name]  70 y.o. female  PRE-OPERATIVE DIAGNOSIS:  failed right total hip arthroplasty  POST-OPERATIVE DIAGNOSIS:  failed right total hip arthroplasty  PROCEDURE:  Procedure(s): Right hip acetabular vs total hip arthroplasty revision (Right)  SURGEON:  Surgeon(s) and Role:    Gaynelle Arabian, MD - Primary  PHYSICIAN ASSISTANT:   ASSISTANTS: Fenton Foy, PA-C   ANESTHESIA:   spinal  EBL:  350 mL   BLOOD ADMINISTERED:none  DRAINS: none   LOCAL MEDICATIONS USED:  MARCAINE     SPECIMEN:  No Specimen  DISPOSITION OF SPECIMEN:  N/A  COUNTS:  YES  TOURNIQUET:  * No tourniquets in log *  DICTATION: .Other Dictation: Dictation Number [id number]  PLAN OF CARE: Admit for overnight observation  PATIENT DISPOSITION:  PACU - hemodynamically stable.

## 2021-07-14 NOTE — Anesthesia Preprocedure Evaluation (Addendum)
Anesthesia Evaluation  Patient identified by MRN, date of birth, ID band Patient awake    Reviewed: Allergy & Precautions, H&P , NPO status , Patient's Chart, lab work & pertinent test results, reviewed documented beta blocker date and time   History of Anesthesia Complications (+) PONV, DIFFICULT AIRWAY and history of anesthetic complications  Airway Mallampati: II  TM Distance: <3 FB Neck ROM: full    Dental no notable dental hx.    Pulmonary sleep apnea and Continuous Positive Airway Pressure Ventilation , Current Smoker,    Pulmonary exam normal breath sounds clear to auscultation       Cardiovascular Exercise Tolerance: Good negative cardio ROS   Rhythm:regular Rate:Normal     Neuro/Psych  Headaches, PSYCHIATRIC DISORDERS Anxiety Depression  Neuromuscular disease    GI/Hepatic Neg liver ROS, GERD  ,  Endo/Other  Hypothyroidism   Renal/GU negative Renal ROS  negative genitourinary   Musculoskeletal  (+) Arthritis , Osteoarthritis,    Abdominal   Peds  Hematology negative hematology ROS (+)   Anesthesia Other Findings   Reproductive/Obstetrics negative OB ROS                            Anesthesia Physical Anesthesia Plan  ASA: 3  Anesthesia Plan: MAC and Spinal   Post-op Pain Management:    Induction:   PONV Risk Score and Plan: 2  Airway Management Planned: Nasal Cannula, Natural Airway, Simple Face Mask and Mask  Additional Equipment: None  Intra-op Plan:   Post-operative Plan:   Informed Consent: I have reviewed the patients History and Physical, chart, labs and discussed the procedure including the risks, benefits and alternatives for the proposed anesthesia with the patient or authorized representative who has indicated his/her understanding and acceptance.     Dental Advisory Given  Plan Discussed with: CRNA and Anesthesiologist  Anesthesia Plan Comments:          Anesthesia Quick Evaluation

## 2021-07-14 NOTE — Interval H&P Note (Signed)
History and Physical Interval Note:  07/14/2021 12:22 PM  [name]  has presented today for surgery, with the diagnosis of failed right total hip arthroplasty.  The various methods of treatment have been discussed with the patient and family. After consideration of risks, benefits and other options for treatment, the patient has consented to  Procedure(s): Right hip acetabular vs total hip arthroplasty revision (Right) as a surgical intervention.  The patient's history has been reviewed, patient examined, no change in status, stable for surgery.  I have reviewed the patient's chart and labs.  Questions were answered to the patient's satisfaction.     Pilar Plate Jevin Camino

## 2021-07-14 NOTE — Anesthesia Procedure Notes (Signed)
Spinal  Patient location during procedure: OR Start time: 07/14/2021 1:40 PM End time: 07/14/2021 2:43 PM Reason for block: surgical anesthesia Staffing Anesthesiologist: Janeece Riggers, MD Preanesthetic Checklist Completed: patient identified, IV checked, site marked, risks and benefits discussed, surgical consent, monitors and equipment checked, pre-op evaluation and timeout performed Spinal Block Patient position: sitting Prep: DuraPrep Patient monitoring: heart rate, cardiac monitor, continuous pulse ox and blood pressure Approach: midline Location: L4-5 Injection technique: single-shot Needle Needle type: Sprotte  Needle gauge: 24 G Needle length: 9 cm Assessment Sensory level: T6 Events: CSF return

## 2021-07-14 NOTE — Transfer of Care (Signed)
Immediate Anesthesia Transfer of Care Note  Patient: CALIYA NARINE  Procedure(s) Performed: Right hip acetabular vs total hip arthroplasty revision (Right: Hip)  Patient Location: PACU  Anesthesia Type:Spinal  Level of Consciousness: awake and oriented  Airway & Oxygen Therapy: Patient Spontanous Breathing and Patient connected to face mask oxygen  Post-op Assessment: Report given to RN and Post -op Vital signs reviewed and stable  Post vital signs: Reviewed and stable  Last Vitals:  Vitals Value Taken Time  BP 101/50 07/14/21 1545  Temp    Pulse 62 07/14/21 1547  Resp 13 07/14/21 1547  SpO2 86 % 07/14/21 1547  Vitals shown include unvalidated device data.  Last Pain:  Vitals:   07/14/21 1236  PainSc: 0-No pain      Patients Stated Pain Goal: 3 (29/03/79 5583)  Complications: No notable events documented.

## 2021-07-15 LAB — BASIC METABOLIC PANEL
Anion gap: 7 (ref 5–15)
Anion gap: 5 (ref 5–15)
BUN: 9 mg/dL (ref 8–23)
BUN: 9 mg/dL (ref 8–23)
CO2: 30 mmol/L (ref 22–32)
CO2: 29 mmol/L (ref 22–32)
Calcium: 8.8 mg/dL — ABNORMAL LOW (ref 8.9–10.3)
Calcium: 8.8 mg/dL — ABNORMAL LOW (ref 8.9–10.3)
Chloride: 100 mmol/L (ref 98–111)
Chloride: 102 mmol/L (ref 98–111)
Creatinine, Ser: 0.68 mg/dL (ref 0.44–1.00)
Creatinine, Ser: 0.6 mg/dL (ref 0.44–1.00)
GFR, Estimated: >60 mL/min (ref >60)
GFR, Estimated: >60 mL/min (ref >60)
Glucose, Bld: 139 mg/dL — ABNORMAL HIGH (ref 70–99)
Glucose, Bld: 141 mg/dL — ABNORMAL HIGH (ref 70–99)
Potassium: 2.8 mmol/L — ABNORMAL LOW (ref 3.5–5.1)
Potassium: 4.1 mmol/L (ref 3.5–5.1)
Sodium: 137 mmol/L (ref 135–145)
Sodium: 136 mmol/L (ref 135–145)

## 2021-07-15 LAB — CBC
HCT: 34 % — ABNORMAL LOW (ref 36.0–46.0)
Hemoglobin: 11.4 g/dL — ABNORMAL LOW (ref 12.0–15.0)
MCH: 29.4 pg (ref 26.0–34.0)
MCHC: 33.5 g/dL (ref 30.0–36.0)
MCV: 87.6 fL (ref 80.0–100.0)
Platelets: 264 10*3/uL (ref 150–400)
RBC: 3.88 MIL/uL (ref 3.87–5.11)
RDW: 12.8 % (ref 11.5–15.5)
WBC: 17.8 10*3/uL — ABNORMAL HIGH (ref 4.0–10.5)
nRBC: 0 % (ref 0.0–0.2)

## 2021-07-15 MED ORDER — METHOCARBAMOL 500 MG PO TABS: 500.0000 mg | ORAL_TABLET | Freq: Four times a day (QID) | ORAL | 0 refills | Status: DC | PRN

## 2021-07-15 MED ORDER — TRAMADOL HCL 50 MG PO TABS: 50.0000 mg | ORAL_TABLET | Freq: Four times a day (QID) | ORAL | 0 refills | Status: DC | PRN

## 2021-07-15 MED ORDER — OXYCODONE HCL 5 MG PO TABS: 5.0000 mg | ORAL_TABLET | Freq: Four times a day (QID) | ORAL | 0 refills | Status: DC | PRN

## 2021-07-15 MED ORDER — RIVAROXABAN 10 MG PO TABS: 10.0000 mg | ORAL_TABLET | Freq: Every day | ORAL | 0 refills | Status: DC

## 2021-07-15 MED ORDER — POTASSIUM CHLORIDE CRYS ER 20 MEQ PO TBCR
40.0000 meq | EXTENDED_RELEASE_TABLET | ORAL | Status: AC
Start: 2021-07-15 — End: 2021-07-15
  Administered 2021-07-15 (×3): 40 meq via ORAL
  Filled 2021-07-15 (×3): qty 2

## 2021-07-15 NOTE — Evaluation (Signed)
Physical Therapy Evaluation Patient Details Name: [name] MRN: [id number] DOB: 29-Jan-1951 Today's Date: 07/15/2021  History of Present Illness  Pt s/p R THR revision by posterior approach.  Clinical Impression  Pt s/p R THR revision and presents with decreased R LE strength/ROM, post op pain and posterior THP limiting functional mobility.  Pt should progress to dc home with family assist.     Recommendations for follow up therapy are one component of a multi-disciplinary discharge planning process, led by the attending physician.  Recommendations may be updated based on patient status, additional functional criteria and insurance authorization.  Follow Up Recommendations Follow physician's recommendations for discharge plan and follow up therapies    Assistance Recommended at Discharge Frequent or constant Supervision/Assistance  Functional Status Assessment Patient has not had a recent decline in their functional status  Equipment Recommendations  None recommended by PT    Recommendations for Other Services       Precautions / Restrictions Precautions Precautions: Fall;Posterior Hip Precaution Booklet Issued: Yes (comment) Precaution Comments: reviewed all precautions Restrictions Weight Bearing Restrictions: No RLE Weight Bearing: Weight bearing as tolerated      Mobility  Bed Mobility Overal bed mobility: Needs Assistance Bed Mobility: Sit to Supine;Rolling Rolling: Min guard     Sit to supine: Min assist   General bed mobility comments: cues for sequence, use of L LE to self assist and adherence to THP; rolling technique with pillow reviewed for side sleeping    Transfers Overall transfer level: Needs assistance Equipment used: Rolling walker (2 wheels) Transfers: Sit to/from Stand Sit to Stand: Min guard           General transfer comment: cues for LE management and use of UEs to self assist    Ambulation/Gait Ambulation/Gait assistance: Min  guard Gait Distance (Feet): 120 Feet Assistive device: Rolling walker (2 wheels) Gait Pattern/deviations: Step-to pattern;Decreased step length - right;Decreased step length - left;Shuffle;Trunk flexed Gait velocity: decr   General Gait Details: cues for sequence, posture and position from [employer]            Wheelchair Mobility    Modified Rankin (Stroke Patients Only)       Balance Overall balance assessment: Mild deficits observed, not formally tested                                           Pertinent Vitals/Pain Pain Assessment: 0-10 Pain Score: 3  Pain Location: R hip Pain Descriptors / Indicators: Aching;Sore Pain Intervention(s): Limited activity within patient's tolerance;Monitored during session;Premedicated before session;Ice applied    Home Living Family/patient expects to be discharged to:: Private residence Living Arrangements: Spouse/significant other Available Help at Discharge: Family Type of Home: House Home Access: Stairs to enter Entrance Stairs-Rails: Right Entrance Stairs-Number of Steps: 3   Home Layout: One level Home Equipment: Rollator (4 wheels);Cane - single point      Prior Function Prior Level of Function : Independent/Modified Independent                     Hand Dominance        Extremity/Trunk Assessment   Upper Extremity Assessment Upper Extremity Assessment: Overall WFL for tasks assessed    Lower Extremity Assessment Lower Extremity Assessment: RLE deficits/detail RLE Deficits / Details: 2/5 strength at hip with AAROM at hip to 80 flex and 15 abd  Cervical / Trunk Assessment Cervical / Trunk Assessment: Normal  Communication   Communication: No difficulties  Cognition Arousal/Alertness: Awake/alert Behavior During Therapy: WFL for tasks assessed/performed Overall Cognitive Status: Within Functional Limits for tasks assessed                                           General Comments      Exercises Total Joint Exercises Ankle Circles/Pumps: AROM;Both;20 reps;Supine Quad Sets: AROM;Both;10 reps;Supine Heel Slides: AAROM;Right;20 reps;Supine Hip ABduction/ADduction: AAROM;Right;15 reps;Supine Long Arc Quad: AROM;Right;10 reps;Seated   Assessment/Plan    PT Assessment Patient needs continued PT services  PT Problem List Decreased strength;Decreased range of motion;Decreased activity tolerance;Decreased balance;Decreased mobility;Decreased knowledge of use of DME;Decreased knowledge of precautions       PT Treatment Interventions DME instruction;Gait training;Stair training;Functional mobility training;Therapeutic activities;Therapeutic exercise;Patient/family education    PT Goals (Current goals can be found in the Care Plan section)  Acute Rehab PT Goals Patient Stated Goal: Regain IND PT Goal Formulation: With patient Time For Goal Achievement: 07/22/21 Potential to Achieve Goals: Good    Frequency 7X/week   Barriers to discharge        Co-evaluation               AM-PAC PT "6 Clicks" Mobility  Outcome Measure Help needed turning from your back to your side while in a flat bed without using bedrails?: A Little Help needed moving from lying on your back to sitting on the side of a flat bed without using bedrails?: A Little Help needed moving to and from a bed to a chair (including a wheelchair)?: A Little Help needed standing up from a chair using your arms (e.g., wheelchair or bedside chair)?: A Little Help needed to walk in hospital room?: A Little Help needed climbing 3-5 steps with a railing? : A Little 6 Click Score: 18    End of Session Equipment Utilized During Treatment: Gait belt Activity Tolerance: Patient tolerated treatment well Patient left: with call bell/phone within reach;in bed;with family/visitor present Nurse Communication: Mobility status PT Visit Diagnosis: Difficulty in walking, not elsewhere  classified (R26.2)    Time: 4944-9675 PT Time Calculation (min) (ACUTE ONLY): 25 min   Charges:   PT Evaluation $PT Eval Low Complexity: 1 Low PT Treatments $Therapeutic Exercise: 8-22 mins        Debe Coder PT Acute Rehabilitation Services Pager [phone] Office [phone]   Sosie Gato 07/15/2021, 1:01 PM

## 2021-07-15 NOTE — Op Note (Signed)
[name], [name] MEDICAL RECORD NO: [id number] ACCOUNT NO: [id number] DATE OF BIRTH: 02/22/1951 FACILITY: [facility]: [facility] PHYSICIAN: Dione Plover. Charlton Boule, MD  Operative Report   DATE OF PROCEDURE: 07/14/2021   PREOPERATIVE DIAGNOSIS:  Failed right total hip arthroplasty.  POSTOPERATIVE DIAGNOSIS:  Failed right total hip arthroplasty.  PROCEDURE:  Right acetabular revision.  SURGEON:  Dione Plover. Bryker Fletchall, MD  ASSISTANT:  Fenton Foy, PA-C  ANESTHESIA:  Spinal.  ESTIMATED BLOOD LOSS:  350.   DRAINS:  None.  COMPLICATIONS:  None.  CONDITION:  Stable to recovery.  BRIEF CLINICAL NOTE:  The patient is a 70 year old female who had a right total hip arthroplasty performed a few years ago.  She has developed groin pain, which has been worsening with time.  X-rays show potential migration of her acetabular component.   She had a negative bone scan, but given the groin pain and the probable migration on plain films, it was felt that there was instability in the socket itself, so she presents now for acetabular reverse total hip revision.  DESCRIPTION OF PROCEDURE:  After successful administration of spinal anesthetic, the patient was placed in the left lateral decubitus position with the right side up and held with a hip positioner.  Right lower extremity was isolated from her perineum  with plastic drapes and prepped and draped in the usual sterile fashion.  Posterolateral incision was made with a 10 blade through subcutaneous tissue to the fascia lata, which was incised in line with the skin incision.  Sciatic nerve was palpated and  protected and the short rotators isolated off the femur.  Capsule was incised and retracted.  This exposed the hip joint.  There was no fluid in the joint.  I dislocated the hip and then removed the prosthetic femoral head.  The femur was then retracted  anteriorly to gain acetabular exposure.  Acetabular retractors were placed.  The polyethylene is  removed from the acetabular shell.  Cleared soft tissue and some overhanging bone to get to the rim of the shell the edge of it.  Osteotome was used to disrupt the interface between the acetabular  component and bone.  There was ease in entering this interface and it appeared that there was just a spot weld fixation on a few spots.  I was able to extract a cup and noted that there was no bony ingrowth in the cup.  It was all fibrous tissue that had  grown into the cup.  This was a 50 mm Pinnacle acetabular shell that was removed.  We began reaming starting at 45 up to 51 mm.  The 52 Pinnacle acetabular shell was then impacted in anatomic position with great purchase.  I placed 2 additional dome  screws for additional fixation.  This was a very stable construct.  A 32 mm neutral +4 Marathon liner was then placed.  I expected the femoral component was in excellent position and was well fixed.  We placed a trial 32+1 head and the hip reduced, but had some soft tissue laxity.  So we went for 32 +5, which had much better soft tissue tension.  There was great stability  with full extension, full external rotation, 70 degrees flexion, 40 degrees adduction and 90 degrees internal rotation, then 90 degrees of flexion and 70 degrees of internal rotation.  While placing the right leg on top the left leg lengths were found  to be equal.  Hip was dislocated and trial head removed.  A 32+5  metal head was placed onto the femoral neck and impacted.  The hip was reduced with the same stability parameters.  Wound was copiously irrigated with saline solution and the capsule  reattached to the femur through drill holes with Ethibond suture.  Fascia lata was closed with running 0 Stratafix.  Subcutaneous was closed with interrupted 2-0 Vicryl and subcuticular running 4-0 Monocryl.  Incision was cleaned, dried, and Steri-Strips  and a bulky sterile dressing applied.  She was placed into a knee immobilizer, awakened, and  transported to recovery in stable condition.  Please note that a surgical assistant was of medical necessity for this procedure.  Surgical assistant was necessary for retraction of vital structures and also for proper positioning of the limb and proper retraction for safe removal of the old implant  in safe and accurate placement of the new implant.      SUJ D: 07/14/2021 3:28:33 pm T: 07/15/2021 4:58:00 am  JOB: [id number]/ [id number]

## 2021-07-15 NOTE — Progress Notes (Addendum)
    Subjective: 1 Day Post-Op Procedure(s) (LRB): Right hip acetabular vs total hip arthroplasty revision (Right) Patient reports pain as mild.   We will start therapy today.  Plan is to go Home after hospital stay.  Objective: Vital signs in last 24 hours: Temp:  [97.7 F (36.5 C)-98.9 F (37.2 C)] 98.9 F (37.2 C) (10/27 0002) Pulse Rate:  [54-77] 77 (10/27 0002) Resp:  [15-18] 16 (10/27 0002) BP: (97-128)/(49-66) 128/66 (10/27 0002) SpO2:  [93 %-99 %] 95 % (10/27 0002) Weight:  [69.9 kg] 69.9 kg (10/26 1759)  Intake/Output from previous day:  Intake/Output Summary (Last 24 hours) at 07/15/2021 0746 Last data filed at 07/15/2021 0600 Gross per 24 hour  Intake 3648.39 ml  Output 1375 ml  Net 2273.39 ml    Intake/Output this shift: No intake/output data recorded.  Labs: Recent Labs    07/15/21 0327  HGB 11.4*   Recent Labs    07/15/21 0327  WBC 17.8*  RBC 3.88  HCT 34.0*  PLT 264   Recent Labs    07/15/21 0327  NA 137  K 2.8*  CL 100  CO2 30  BUN 9  CREATININE 0.68  GLUCOSE 139*  CALCIUM 8.8*   No results for input(s): LABPT, INR in the last 72 hours.  EXAM General - Patient is Alert, Appropriate, and Oriented Extremity - Neurologically intact Neurovascular intact No cellulitis present Compartment soft Dressing - dressing C/D/I Motor Function - intact, moving foot and toes well on exam.    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   right hemidiaphragm elevation   Difficult intubation    difficult intubation week  before March 06, 2013 surgery, was not  intubated for 06-Mar-2013 surgery, but laid flat on stomach    Difficult intubation    and coded and saw dr Tressia Miners turner and was told she had osa and that caused code   GERD (gastroesophageal reflux disease)    Headache(784.0)    migraines occasional   Hyperlipidemia    Hypothyroidism    Multiple allergies    SEASONAL   OSA (obstructive sleep apnea)    on CPAP setting of 13   PONV (postoperative nausea and vomiting)    has to use scop patch, coded during 06-Mar-2013 surgery on left foot with dr hewitt, sleep apnea found   RLS (restless legs syndrome)    Tachycardia, paroxysmal (HCC)    sinus tachycardia takes atenolol   Wears glasses     Assessment/Plan: 1 Day Post-Op Procedure(s) (LRB): Right hip acetabular vs total hip arthroplasty revision (Right) Principal Problem:   Failed total hip arthroplasty ([place]) Active Problems:   S/P revision of total hip   Advance diet Up with therapy Posterior hip precautions Discharge home today if comfortable and does well wth PT Potassium 2.8 this AM. Kdur ordered and repeat BMET this afternoon   Pilar Plate Stefanee Mckell

## 2021-07-15 NOTE — TOC Transition Note (Signed)
Transition of Care [facility]) - CM/SW Discharge Note   Patient Details  Name: [name] MRN: [id number] Date of Birth: [birth year]  [facility] CM/SW Contact:  Lennart Pall, LCSW Phone Number: 07/15/2021, 1:18 PM   Clinical Narrative:     Met with pt and family to review dc needs.  Pt notes plans for HEP.  She does report need for rolling walker and 3n1 commode - orders placed with [facility].  No further TOC needs.  Final next level of care: Home/Self Care Barriers to Discharge: No Barriers Identified   Patient Goals and CMS Choice Patient states their goals for this hospitalization and ongoing recovery are:: return home      Discharge Placement                       Discharge Plan and Services                DME Arranged: 3-N-1, Walker rolling DME Agency: [employer] Date DME Agency Contacted: 07/15/21 Time DME Agency Contacted: [phone]              Social Determinants of Health (SDOH) Interventions     Readmission Risk Interventions No flowsheet data found.

## 2021-07-15 NOTE — Plan of Care (Signed)
  Problem: Coping: Goal: Level of anxiety will decrease Outcome: Progressing   Problem: Pain Managment: Goal: General experience of comfort will improve Outcome: Progressing   Problem: Safety: Goal: Ability to remain free from injury will improve Outcome: Progressing   

## 2021-07-16 ENCOUNTER — Encounter (HOSPITAL_COMMUNITY): Payer: Self-pay | Admitting: Orthopedic Surgery

## 2021-07-28 NOTE — Discharge Summary (Signed)
Physician Discharge Summary   Patient ID: [name] MRN: [id number] DOB/AGE: Aug 04, 1951 70 y.o.  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   right hemidiaphragm elevation   Difficult intubation    difficult intubation week  before 03/06/13 surgery, was not intubated for 06-Mar-2013 surgery, but laid flat on stomach    Difficult intubation    and coded and saw dr Tressia Miners turner and was told she had osa and that caused code   GERD (gastroesophageal reflux disease)    Headache(784.0)    migraines occasional   Hyperlipidemia    Hypothyroidism    Multiple allergies    SEASONAL   OSA (obstructive sleep apnea)    on CPAP setting of 13   PONV (postoperative nausea and vomiting)    has to use scop patch, coded during 03/06/2013 surgery on left foot with dr hewitt, sleep apnea found   RLS (restless legs syndrome)    Tachycardia, paroxysmal (HCC)    sinus tachycardia takes atenolol   Wears glasses    Discharge Diagnoses:   Principal Problem:   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   Weight as of this encounter: 69.9 kg.  Procedure:  Procedure(s) (LRB): Right hip acetabular vs total hip arthroplasty revision (Right)   Consults: None  HPI: The patient is a 70 year old female who had a  right total hip arthroplasty performed a few years ago.  She has developed groin pain, which has been worsening with time.  X-rays show potential migration of her acetabular component.   She had a negative bone scan, but given the groin pain and the probable migration on plain films, it was felt that there was instability in the socket itself, so she presents now for acetabular reverse total hip revision.  Laboratory Data: Admission on 07/14/2021, Discharged on 07/15/2021  Component Date Value Ref Range Status   WBC 07/15/2021 17.8 (A)  4.0 - 10.5 K/uL Final   RBC 07/15/2021 3.88  3.87 - 5.11 MIL/uL Final   Hemoglobin 07/15/2021 11.4 (A)  12.0 - 15.0 g/dL Final   HCT 07/15/2021 34.0 (A)  36.0 - 46.0 % Final   MCV 07/15/2021 87.6  80.0 - 100.0 fL Final   MCH 07/15/2021 29.4  26.0 - 34.0 pg Final   MCHC 07/15/2021 33.5  30.0 - 36.0 g/dL Final   RDW 07/15/2021 12.8  11.5 - 15.5 % Final   Platelets 07/15/2021 264  150 - 400 K/uL Final   nRBC 07/15/2021 0.0  0.0 - 0.2 % Final   Performed at [facility], [place] [address], [place], [place] [address]   Sodium 07/15/2021 137  135 - 145 mmol/L Final   Potassium 07/15/2021 2.8 (A)  3.5 - 5.1 mmol/L  Final   Chloride 07/15/2021 100  98 - 111 mmol/L Final   CO2 07/15/2021 30  22 - 32 mmol/L Final   Glucose, Bld 07/15/2021 139 (A)  70 - 99 mg/dL Final   Glucose reference range applies only to samples taken after fasting for at least 8 hours.   BUN 07/15/2021 9  8 - 23 mg/dL Final   Creatinine, Ser 07/15/2021 0.68  0.44 - 1.00 mg/dL Final   Calcium 07/15/2021 8.8 (A)  8.9 - 10.3 mg/dL Final   GFR, Estimated 07/15/2021 >60  >60 mL/min Final   Comment: (NOTE) Calculated using the CKD-EPI Creatinine Equation (2021)    Anion gap 07/15/2021 7  5 - 15 Final   Performed at [facility], [place] [address] [place], [place] [address]   Sodium 07/15/2021 136  135 - 145 mmol/L Final   Potassium 07/15/2021 4.1  3.5 - 5.1 mmol/L  Final   Comment: DELTA CHECK NOTED NO VISIBLE HEMOLYSIS    Chloride 07/15/2021 102  98 - 111 mmol/L Final   CO2 07/15/2021 29  22 - 32 mmol/L Final   Glucose, Bld 07/15/2021 141 (A)  70 - 99 mg/dL Final   Glucose reference range applies only to samples taken after fasting for at least 8 hours.   BUN 07/15/2021 9  8 - 23 mg/dL Final   Creatinine, Ser 07/15/2021 0.60  0.44 - 1.00 mg/dL Final   Calcium 07/15/2021 8.8 (A)  8.9 - 10.3 mg/dL Final   GFR, Estimated 07/15/2021 >60  >60 mL/min Final   Comment: (NOTE) Calculated using the CKD-EPI Creatinine Equation (2021)    Anion gap 07/15/2021 5  5 - 15 Final   Performed at [facility], [place] [address], [place], [place] [address]  Orders Only on 07/12/2021  Component Date Value Ref Range Status   SARS Coronavirus 2 07/12/2021 RESULT: NEGATIVE   Final   Comment: RESULT: NEGATIVESARS-CoV-2 INTERPRETATION:A NEGATIVE  test result means that SARS-CoV-2 RNA was not present in the specimen above the limit of detection of this test. This does not preclude a possible SARS-CoV-2 infection and should not be used as the  sole basis for patient management decisions. Negative results must be combined with clinical observations, patient history, and epidemiological information. Optimum specimen types and timing for peak viral levels during infections caused by SARS-CoV-2  have not been determined. Collection of multiple specimens or types of specimens may be necessary to detect virus. Improper specimen collection and handling, sequence variability under primers/probes, or organism present below the limit of detection may  lead to false negative results. Positive and negative predictive values of testing are highly dependent on prevalence. False negative test results are more likely when prevalence of disease is high.The expected result is NEGATIVE.Fact S                          heet for  Healthcare Providers:  LocalChronicle.no Sheet for Patients: SalonLookup.es Reference Range - Negative   Hospital Outpatient Visit on 07/01/2021  Component Date Value Ref Range Status   WBC 07/01/2021 8.3  4.0 - 10.5 K/uL Final   RBC 07/01/2021 4.77  3.87 - 5.11 MIL/uL Final   Hemoglobin 07/01/2021 14.1  12.0 - 15.0 g/dL Final   HCT 07/01/2021 42.1  36.0 - 46.0 % Final   MCV 07/01/2021 88.3  80.0 - 100.0 fL Final   MCH 07/01/2021 29.6  26.0 - 34.0 pg Final   MCHC  07/01/2021 33.5  30.0 - 36.0 g/dL Final   RDW 07/01/2021 13.0  11.5 - 15.5 % Final   Platelets 07/01/2021 340  150 - 400 K/uL Final   nRBC 07/01/2021 0.0  0.0 - 0.2 % Final   Performed at [facility], [place] [address], [place], [place] [address]   Sodium 07/01/2021 138  135 - 145 mmol/L Final   Potassium 07/01/2021 3.5  3.5 - 5.1 mmol/L Final   Chloride 07/01/2021 102  98 - 111 mmol/L Final   CO2 07/01/2021 29  22 - 32 mmol/L Final   Glucose, Bld 07/01/2021 96  70 - 99 mg/dL Final   Glucose reference range applies only to samples taken after fasting for at least 8 hours.   BUN 07/01/2021 8  8 - 23 mg/dL Final   Creatinine, Ser 07/01/2021 0.62  0.44 - 1.00 mg/dL Final   Calcium 07/01/2021 9.6  8.9 - 10.3 mg/dL Final   Total Protein 07/01/2021 7.4  6.5 - 8.1 g/dL Final   Albumin 07/01/2021 4.0  3.5 - 5.0 g/dL Final   AST 07/01/2021 17  15 - 41 U/L Final   ALT 07/01/2021 15  0 - 44 U/L Final   Alkaline Phosphatase 07/01/2021 75  38 - 126 U/L Final   Total Bilirubin 07/01/2021 0.5  0.3 - 1.2 mg/dL Final   GFR, Estimated 07/01/2021 >60  >60 mL/min Final   Comment: (NOTE) Calculated using the CKD-EPI Creatinine Equation (2021)    Anion gap 07/01/2021 7  5 - 15 Final   Performed at [facility], [place] [address] [place], [place] [address]   ABO/RH(D) 07/01/2021 B NEG   Final   Antibody Screen 07/01/2021 NEG   Final   Sample Expiration 07/01/2021  07/15/2021,2359   Final   Extend sample reason 07/01/2021    Final                   Value:NO TRANSFUSIONS OR PREGNANCY IN THE PAST 3 MONTHS Performed at [place] [address], [place], [place] [address]    Prothrombin Time 07/01/2021 13.0  11.4 - 15.2 seconds Final   INR 07/01/2021 1.0  0.8 - 1.2 Final   Comment: (NOTE) INR goal varies based on device and disease states. Performed at [facility], [place] [address] [place], [place] [address]    MRSA, PCR 07/01/2021 NEGATIVE  NEGATIVE Final   Staphylococcus aureus 07/01/2021 NEGATIVE  NEGATIVE Final   Comment: (NOTE) The Xpert SA Assay (FDA approved for NASAL specimens in patients 93 years of age and older), is one component of a comprehensive surveillance program. It is not intended to diagnose infection nor to guide or monitor treatment. Performed at [facility], [place] [address], [place], [place] [address]      X-Rays:DG Pelvis Portable  Result Date: 07/14/2021 CLINICAL DATA:  Status post right hip replacement EXAM: PORTABLE PELVIS 1-2 VIEWS COMPARISON:  Pelvis radiographs 03/20/2018 FINDINGS: The patient is status post right hip arthroplasty. Hardware alignment is within expected limits, without evidence of hardware related complication. Degenerative change about the left hip is similar to the prior study. The SI joints and symphysis pubis are intact. IMPRESSION: Status post right hip arthroplasty without evidence of complication. Electronically Signed   By: Valetta Mole M.D.   On: 07/14/2021 18:07    EKG: Orders placed or performed in visit on 08/31/20   EKG 12-Lead     Hospital Course: Kelly Moor  Bradford is a 70 y.o. who was admitted to [facility]. They were brought to the operating room on 07/14/2021 and underwent Procedure(s): Right hip acetabular vs total hip arthroplasty revision.  Patient tolerated the procedure well and was later transferred to the recovery room and  then to the orthopaedic floor for postoperative care. They were given PO and IV analgesics for pain control following their surgery. They were given 24 hours of postoperative antibiotics of  Anti-infectives (From admission, onward)    Start     Dose/Rate Route Frequency Ordered Stop   07/14/21 2000  ceFAZolin (ANCEF) IVPB 2g/100 mL premix        2 g 200 mL/hr over 30 Minutes Intravenous Every 6 hours 07/14/21 1751 07/15/21 0200   07/14/21 1230  ceFAZolin (ANCEF) IVPB 2g/100 mL premix        2 g 200 mL/hr over 30 Minutes Intravenous On call to O.R. 07/14/21 1218 07/14/21 1350      and started on DVT prophylaxis in the form of Xarelto and TED hose.   PT and OT were ordered for total joint protocol. Discharge planning consulted to help with postop disposition and equipment needs. Patient had an uneventful night on the evening of surgery. They started to get up OOB with therapy on 07/14/2021. Pt worked with therapy and was meeting their goals. She was discharged to home later that day in stable condition.  Diet: Regular diet Activity: WBAT Follow-up: in 2 weeks Disposition: Home Discharged Condition: good   Discharge Instructions     Call MD / Call 911   Complete by: As directed    If you experience chest pain or shortness of breath, CALL 911 and be transported to the hospital emergency room.  If you develope a fever above 101 F, pus (white drainage) or increased drainage or redness at the wound, or calf pain, call your surgeon's office.   Change dressing   Complete by: As directed    You have an adhesive waterproof bandage over the incision. Leave this in place until your first follow-up appointment. Once you remove this you will not need to place another bandage.   Constipation Prevention   Complete by: As directed    Drink plenty of fluids.  Prune juice may be helpful.  You may use a stool softener, such as Colace (over the counter) 100 mg twice a day.  Use MiraLax (over the counter)  for constipation as needed.   Diet - low sodium heart healthy   Complete by: As directed    Do not sit on low chairs, stoools or toilet seats, as it may be difficult to get up from low surfaces   Complete by: As directed    Driving restrictions   Complete by: As directed    No driving for two weeks   Follow the hip precautions as taught in Physical Therapy   Complete by: As directed    Post-operative opioid taper instructions:   Complete by: As directed    POST-OPERATIVE OPIOID TAPER INSTRUCTIONS: It is important to wean off of your opioid medication as soon as possible. If you do not need pain medication after your surgery it is ok to stop day one. Opioids include: Codeine, Hydrocodone(Norco, Vicodin), Oxycodone(Percocet, oxycontin) and hydromorphone amongst others.  Long term and even short term use of opiods can cause: Increased pain response Dependence Constipation Depression Respiratory depression And more.  Withdrawal symptoms can include Flu like symptoms Nausea, vomiting And more Techniques to manage  these symptoms Hydrate well Eat regular healthy meals Stay active Use relaxation techniques(deep breathing, meditating, yoga) Do Not substitute Alcohol to help with tapering If you have been on opioids for less than two weeks and do not have pain than it is ok to stop all together.  Plan to wean off of opioids This plan should start within one week post op of your joint replacement. Maintain the same interval or time between taking each dose and first decrease the dose.  Cut the total daily intake of opioids by one tablet each day Next start to increase the time between doses. The last dose that should be eliminated is the evening dose.      TED hose   Complete by: As directed    Use stockings (TED hose) for three weeks on both leg(s).  You may remove them at night for sleeping.   Weight bearing as tolerated   Complete by: As directed       Allergies as of  07/15/2021       Reactions   Dilaudid [hydromorphone Hcl] Nausea And Vomiting, Other (See Comments)   Tachycardia, HOT MOUTH        Medication List     STOP taking these medications    aspirin 325 MG tablet   calcium carbonate 500 MG chewable tablet Commonly known as: TUMS - dosed in mg elemental calcium   FISH OIL PO   HYDROcodone-acetaminophen 10-325 MG tablet Commonly known as: NORCO   ibuprofen 200 MG tablet Commonly known as: ADVIL   multivitamin with minerals tablet   sertraline 100 MG tablet Commonly known as: ZOLOFT   Vitamin D 50 MCG (2000 UT) tablet       TAKE these medications    albuterol 108 (90 Base) MCG/ACT inhaler Commonly known as: VENTOLIN HFA Inhale 2 puffs into the lungs every 6 (six) hours as needed for wheezing.   atenolol 25 MG tablet Commonly known as: TENORMIN Take 25 mg by mouth every morning.   atorvastatin 20 MG tablet Commonly known as: LIPITOR Take 20 mg by mouth 2 (two) times a week. Mondays and Thursday   cetirizine-pseudoephedrine 5-120 MG tablet Commonly known as: ZYRTEC-D Take 1 tablet by mouth daily as needed for allergies.   docusate sodium 100 MG capsule Commonly known as: COLACE Take 100 mg by mouth at bedtime as needed for mild constipation.   estradiol 1 MG tablet Commonly known as: ESTRACE Take 1 mg by mouth daily.   fluticasone 110 MCG/ACT inhaler Commonly known as: FLOVENT HFA Inhale 2 puffs into the lungs 2 (two) times daily as needed (for shortness of breath or wheezing).   fluticasone 50 MCG/ACT nasal spray Commonly known as: FLONASE Place 2 sprays into both nostrils at bedtime as needed for allergies or rhinitis.   ketotifen 0.025 % ophthalmic solution Commonly known as: ZADITOR Place 1 drop into both eyes 2 (two) times daily as needed (allergies).   levothyroxine 150 MCG tablet Commonly known as: SYNTHROID Take 150 mcg by mouth daily.   methocarbamol 500 MG tablet Commonly known as:  ROBAXIN Take 1 tablet (500 mg total) by mouth every 6 (six) hours as needed for muscle spasms.   montelukast 10 MG tablet Commonly known as: SINGULAIR Take 10 mg by mouth at bedtime.   oxyCODONE 5 MG immediate release tablet Commonly known as: Oxy IR/ROXICODONE Take 1-2 tablets (5-10 mg total) by mouth every 6 (six) hours as needed for severe pain.   rivaroxaban 10 MG Tabs tablet  Commonly known as: XARELTO Take 1 tablet (10 mg total) by mouth daily with breakfast.   rOPINIRole 1 MG tablet Commonly known as: REQUIP Take 1-1.5 mg by mouth at bedtime.   solifenacin 10 MG tablet Commonly known as: VESICARE Take 10 mg by mouth daily.   traMADol 50 MG tablet Commonly known as: ULTRAM Take 1-2 tablets (50-100 mg total) by mouth every 6 (six) hours as needed for moderate pain.   zolmitriptan 5 MG tablet Commonly known as: ZOMIG Take 5 mg by mouth as needed for migraine.               Discharge Care Instructions  (From admission, onward)           Start     Ordered   07/15/21 0000  Weight bearing as tolerated        07/15/21 1008   07/15/21 0000  Change dressing       Comments: You have an adhesive waterproof bandage over the incision. Leave this in place until your first follow-up appointment. Once you remove this you will not need to place another bandage.   07/15/21 1008            Follow-up Information     Gaynelle Arabian, MD Follow up in 2 week(s).   Specialty: Orthopedic Surgery Contact information: [address] [place] [address] [phone]                 Signed: Fenton Foy, MBA, PA-C Orthopedic Surgery 07/28/2021, 10:31 AM

## 2021-12-01 NOTE — Progress Notes (Signed)
? ?Virtual Visit via Video Note  ? ?This visit type was conducted due to national recommendations for restrictions regarding the COVID-19 Pandemic (e.g. social distancing) in an effort to limit this patient's exposure and mitigate transmission in our community.  Due to her co-morbid illnesses, this patient is at least at moderate risk for complications without adequate follow up.  This format is felt to be most appropriate for this patient at this time.  All issues noted in this document were discussed and addressed.  A limited physical exam was performed with this format.  Please refer to the patient's chart for her consent to telehealth for [facility]. ? ?   ?Date:  12/02/2021  ? ?ID:  [name], DOB Feb 15, 1951, MRN [id number] ?The patient was identified using 2 identifiers. ? ?Patient Location: Home ?Provider Location: Home Office ? ? ?PCP:  Shirline Frees, MD ?  ?[facility] Providers ?Cardiologist:  Fransico Him, MD ?Sleep Medicine:  Fransico Him, MD    ? ?Evaluation Performed:  Follow-Up Visit ? ?Chief Complaint:  OSA ? ?History of Present Illness:   ? ?[name] is a 71 y.o. female with a hx of OSA, obesity and RLS.  She is doing well with her CPAP device and thinks that she has gotten used to it.  She tolerates the mask and feels the pressure is adequate.  She says that she is not sleeping as well at night as she would like because of insomnia.  She had hip surgery this past year and had a lot of problems with pain disrupting sleep.   She denies any significant mouth or nasal dryness or nasal congestion.  She does not think that he snores.    ? ?The patient does not have symptoms concerning for COVID-19 infection (fever, chills, cough, or new shortness of breath).  ? ? ?Past Medical History:  ?Diagnosis Date  ? Adenomatous polyps   ? Anxiety   ? Arthritis   ? low back pain BETTER SINCE HIP SURGERY JAN 2019  ? Bradycardia, sinus   ? with cardiac arrest in setting of respiratory arrest  during anesthesia - cardiac workup with echo and nuclear showed no ischemia and normal LVF  ? Cancer ([place]) 02/2010  ? SQUAMOUS CELL  cell skin cancer  ? Depression   ? 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 right hemidiaphragm elevation  ? Difficult intubation   ? difficult intubation week  before 03-23-2013 surgery, was not intubated for 03-23-2013 surgery, but laid flat on stomach   ? Difficult intubation   ? and coded and saw dr Tressia Miners [place] and was told she had osa and that caused code  ? GERD (gastroesophageal reflux disease)   ? Headache(784.0)   ? migraines occasional  ? Hyperlipidemia   ? Hypothyroidism   ? Multiple allergies   ? SEASONAL  ? OSA (obstructive sleep apnea)   ? on CPAP setting of 13  ? PONV (postoperative nausea and vomiting)   ? has to use scop patch, coded during 03-23-2013 surgery on left foot with dr hewitt, sleep apnea found  ? RLS (restless legs syndrome)   ? Tachycardia, paroxysmal ([place])   ? sinus tachycardia takes atenolol  ? Wears glasses   ? ?Past Surgical History:  ?Procedure Laterality Date  ? ABDOMINAL HYSTERECTOMY  1988  ? partial  ? APPENDECTOMY    ? with hyst  ? BIOPSY N/A 02/02/2018  ? Procedure: BIOPSY;  Surgeon: Ronald Lobo, MD;  Location: [facility];  Service: Endoscopy;  Laterality: N/A;  ? COLONOSCOPY    ? x3  ? COLONOSCOPY WITH PROPOFOL N/A 11/04/2014  ? Procedure: COLONOSCOPY WITH PROPOFOL;  Surgeon: Cleotis Nipper, MD;  Location: [facility];  Service: Endoscopy;  Laterality: N/A;  ? COLONOSCOPY WITH PROPOFOL N/A 02/02/2018  ? Procedure: COLONOSCOPY WITH PROPOFOL;  Surgeon: Ronald Lobo, MD;  Location: [facility];  Service: Endoscopy;  Laterality: N/A;  ? COLONOSCOPY WITH PROPOFOL N/A 05/20/2021  ? Procedure: COLONOSCOPY WITH PROPOFOL;  Surgeon: Ronald Lobo, MD;  Location: [facility];  Service: Endoscopy;  Laterality: N/A;  ? ELBOW BURSA SURGERY  2012  ? lt  ? GASTROCNEMIUS RECESSION Left 02/21/2013  ? Procedure: GASTROCNEMIUS RECESSION;   Surgeon: Wylene Simmer, MD;  Location: [facility];  Service: Orthopedics;  Laterality: Left;  ? HEMORRHOID SURGERY  2010  ? [facility]  ? POLYPECTOMY N/A 02/02/2018  ? Procedure: POLYPECTOMY;  Surgeon: Ronald Lobo, MD;  Location: [facility];  Service: Endoscopy;  Laterality: N/A;  ? POLYPECTOMY  05/20/2021  ? Procedure: POLYPECTOMY;  Surgeon: Ronald Lobo, MD;  Location: [facility];  Service: Endoscopy;;  ? STRABISMUS SURGERY    ? age 12 x3  ? TOTAL HIP ARTHROPLASTY Right 09/20/2017  ? Procedure: RIGHT TOTAL HIP ARTHROPLASTY ANTERIOR APPROACH;  Surgeon: Gaynelle Arabian, MD;  Location: [facility];  Service: Orthopedics;  Laterality: Right;  ? TOTAL HIP REVISION Right 07/14/2021  ? Procedure: Right hip acetabular vs total hip arthroplasty revision;  Surgeon: Gaynelle Arabian, MD;  Location: [facility];  Service: Orthopedics;  Laterality: Right;  ? WISDOM TOOTH EXTRACTION    ?  ? ?Current Meds  ?Medication Sig  ? albuterol (PROVENTIL HFA;VENTOLIN HFA) 108 (90 BASE) MCG/ACT inhaler Inhale 2 puffs into the lungs every 6 (six) hours as needed for wheezing.  ? aspirin 325 MG tablet Take 325 mg by mouth daily.  ? atenolol (TENORMIN) 25 MG tablet Take 25 mg by mouth every morning.   ? cetirizine-pseudoephedrine (ZYRTEC-D) 5-120 MG per tablet Take 1 tablet by mouth daily as needed for allergies.   ? docusate sodium (COLACE) 100 MG capsule Take 100 mg by mouth at bedtime as needed for mild constipation.  ? estradiol (ESTRACE) 1 MG tablet Take 1 mg by mouth daily.  ? fluticasone (FLONASE) 50 MCG/ACT nasal spray Place 2 sprays into both nostrils at bedtime as needed for allergies or rhinitis.  ? fluticasone (FLOVENT HFA) 110 MCG/ACT inhaler Inhale 2 puffs into the lungs 2 (two) times daily as needed (for shortness of breath or wheezing).  ? ketotifen (ZADITOR) 0.025 % ophthalmic solution Place 1 drop into both eyes 2 (two) times daily as needed (allergies).  ? levothyroxine (SYNTHROID) 150 MCG tablet Take 150 mcg by mouth daily.   ? methocarbamol (ROBAXIN) 500 MG tablet Take 1 tablet (500 mg total) by mouth every 6 (six) hours as needed for muscle spasms.  ? montelukast (SINGULAIR) 10 MG tablet Take 10 mg by mouth at bedtime.  ? Multiple Vitamins-Minerals (CENTRUM SILVER PO) Take by mouth.  ? Omega-3 Fatty Acids (FISH OIL) 1360 MG CAPS Take by mouth.  ? rOPINIRole (REQUIP) 1 MG tablet Take 1-1.5 mg by mouth at bedtime.  ? solifenacin (VESICARE) 10 MG tablet Take 10 mg by mouth daily.  ? traMADol (ULTRAM) 50 MG tablet Take 1-2 tablets (50-100 mg total) by mouth every 6 (six) hours as needed for moderate pain.  ? zolmitriptan (ZOMIG) 5 MG tablet Take 5 mg by mouth as needed for migraine.  ?  ? ?  Allergies:   Dilaudid [hydromorphone hcl]  ? ?Social History  ? ?Tobacco Use  ? Smoking status: Every Day  ?  Packs/day: 0.50  ?  Years: 30.00  ?  Pack years: 15.00  ?  Types: Cigarettes  ? Smokeless tobacco: Never  ?Vaping Use  ? Vaping Use: Never used  ?Substance Use Topics  ? Alcohol use: Yes  ?  Comment: rare  ? Drug use: No  ?  ? ?Family Hx: ?The patient's family history includes Arrhythmia in her father; CAD in her mother; CVA in her father; Cancer - Colon in her paternal uncle and paternal uncle; Colon polyps in her mother; Parkinson's disease in her mother. ? ?ROS:   ?Please see the history of present illness.    ? ?All other systems reviewed and are negative. ? ? ?Prior CV studies:   ?The following studies were reviewed today: ? ?PAP compliance download ? ?Labs/Other Tests and Data Reviewed:   ? ?EKG:  No ECG reviewed. ? ?Recent Labs: ?07/01/2021: ALT 15 ?07/15/2021: BUN 9; Creatinine, Ser 0.60; Hemoglobin 11.4; Platelets 264; Potassium 4.1; Sodium 136  ? ?Recent Lipid Panel ?No results found for: CHOL, TRIG, HDL, CHOLHDL, LDLCALC, LDLDIRECT ? ?Wt Readings from Last 3 Encounters:  ?12/02/21 148 lb (67.1 kg)  ?07/14/21 154 lb 1.6 oz (69.9 kg)  ?07/01/21 154 lb (69.9 kg)  ?  ? ?Risk Assessment/Calculations:   ?  ? ?    ?Objective:   ? ?Vital  Signs:  BP 130/74   Ht 4' 11.75" (1.518 m)   Wt 148 lb (67.1 kg)   BMI 29.15 kg/m?   ? ?VITAL SIGNS:  reviewed ?GEN:  no acute distress ?EYES:  sclerae anicteric, EOMI - Extraocular Movements Intact ?RESPIRATORY:  n

## 2021-12-02 ENCOUNTER — Other Ambulatory Visit: Payer: Self-pay

## 2021-12-02 ENCOUNTER — Encounter: Payer: Self-pay | Admitting: Cardiology

## 2021-12-02 ENCOUNTER — Telehealth (INDEPENDENT_AMBULATORY_CARE_PROVIDER_SITE_OTHER): Payer: Medicare Other | Admitting: Cardiology

## 2021-12-02 VITALS — BP 130/74 | Ht 59.75 in | Wt 148.0 lb

## 2021-12-02 DIAGNOSIS — G2581 Restless legs syndrome: Secondary | ICD-10-CM

## 2021-12-02 DIAGNOSIS — G4733 Obstructive sleep apnea (adult) (pediatric): Secondary | ICD-10-CM | POA: Diagnosis not present

## 2021-12-02 NOTE — Patient Instructions (Signed)

## 2022-05-11 ENCOUNTER — Telehealth: Payer: Self-pay | Admitting: Acute Care

## 2022-05-12 ENCOUNTER — Other Ambulatory Visit: Payer: Self-pay | Admitting: *Deleted

## 2022-05-12 DIAGNOSIS — F17210 Nicotine dependence, cigarettes, uncomplicated: Secondary | ICD-10-CM

## 2022-05-12 DIAGNOSIS — Z122 Encounter for screening for malignant neoplasm of respiratory organs: Secondary | ICD-10-CM

## 2022-05-12 DIAGNOSIS — Z87891 Personal history of nicotine dependence: Secondary | ICD-10-CM

## 2022-05-12 NOTE — Telephone Encounter (Signed)
Spoke with patient Scheduled SDMV 06/01/22 CT scheduled 06/02/22 Pt voiced understanding and had no further questions.

## 2022-06-01 ENCOUNTER — Encounter: Payer: Self-pay | Admitting: Acute Care

## 2022-06-01 ENCOUNTER — Ambulatory Visit (INDEPENDENT_AMBULATORY_CARE_PROVIDER_SITE_OTHER): Payer: Medicare Other | Admitting: Acute Care

## 2022-06-01 DIAGNOSIS — F17210 Nicotine dependence, cigarettes, uncomplicated: Secondary | ICD-10-CM | POA: Diagnosis not present

## 2022-06-01 NOTE — Patient Instructions (Signed)
Thank you for participating in the [facility]. It was our pleasure to meet you today. We will call you with the results of your scan within the next few days. Your scan will be assigned a Lung RADS category score by the physicians reading the scans.  This Lung RADS score determines follow up scanning.  See below for description of categories, and follow up screening recommendations. We will be in touch to schedule your follow up screening annually or based on recommendations of our providers. We will fax a copy of your scan results to your Primary Care Physician, or the physician who referred you to the program, to ensure they have the results. Please call the office if you have any questions or concerns regarding your scanning experience or results.  Our office number is [phone]. Please speak with Denise Phelps, RN. , or  Denise Buckner RN, They are  our Lung Cancer Screening RN.'s If They are unavailable when you call, Please leave a message on the voice mail. We will return your call at our earliest convenience.This voice mail is monitored several times a day.  Remember, if your scan is normal, we will scan you annually as long as you continue to meet the criteria for the program. (Age 55-77, Current smoker or smoker who has quit within the last 15 years). If you are a smoker, remember, quitting is the single most powerful action that you can take to decrease your risk of lung cancer and other pulmonary, breathing related problems. We know quitting is hard, and we are here to help.  Please let us know if there is anything we can do to help you meet your goal of quitting. If you are a former smoker, congratulations. We are proud of you! Remain smoke free! Remember you can refer friends or family members through the number above.  We will screen them to make sure they meet criteria for the program. Thank you for helping us take better care of you by  participating in Lung Screening.  You can receive free nicotine replacement therapy ( patches, gum or mints) by calling 1-800-QUIT NOW. Please call so we can get you on the path to becoming  a non-smoker. I know it is hard, but you can do this!  Lung RADS Categories:  Lung RADS 1: no nodules or definitely non-concerning nodules.  Recommendation is for a repeat annual scan in 12 months.  Lung RADS 2:  nodules that are non-concerning in appearance and behavior with a very low likelihood of becoming an active cancer. Recommendation is for a repeat annual scan in 12 months.  Lung RADS 3: nodules that are probably non-concerning , includes nodules with a low likelihood of becoming an active cancer.  Recommendation is for a 6-month repeat screening scan. Often noted after an upper respiratory illness. We will be in touch to make sure you have no questions, and to schedule your 6-month scan.  Lung RADS 4 A: nodules with concerning findings, recommendation is most often for a follow up scan in 3 months or additional testing based on our provider's assessment of the scan. We will be in touch to make sure you have no questions and to schedule the recommended 3 month follow up scan.  Lung RADS 4 B:  indicates findings that are concerning. We will be in touch with you to schedule additional diagnostic testing based on our provider's  assessment of the scan.  Other options for assistance in smoking cessation (   As covered by your insurance benefits)  Hypnosis for smoking cessation  [employer] [phone]  Acupuncture for smoking cessation  [facility] [phone]   

## 2022-06-01 NOTE — Progress Notes (Signed)
Virtual Visit via Telephone Note  I connected with [name] on 06/01/22 at 10:30 AM EDT by telephone and verified that I am speaking with the correct person using two identifiers.  Location: Patient:  At home Provider:  [address], [place], [place], [address]    I discussed the limitations, risks, security and privacy concerns of performing an evaluation and management service by telephone and the availability of in person appointments. I also discussed with the patient that there may be a patient responsible charge related to this service. The patient expressed understanding and agreed to proceed.   Shared Decision Making Visit Lung Cancer Screening Program [phone])   Eligibility: Age 71 y.o. Pack Years Smoking History Calculation  24 pack year smoking history (# packs/per year x # years smoked) Recent History of coughing up blood  no Unexplained weight loss? no ( >Than 15 pounds within the last 6 months ) Prior History Lung / other cancer no (Diagnosis within the last 5 years already requiring surveillance chest CT Scans). Smoking Status Current Smoker Former Smokers: Years since quit:  NA  Quit Date:  NA  Visit Components: Discussion included one or more decision making aids. yes Discussion included risk/benefits of screening. yes Discussion included potential follow up diagnostic testing for abnormal scans. yes Discussion included meaning and risk of over diagnosis. yes Discussion included meaning and risk of False Positives. yes Discussion included meaning of total radiation exposure. yes  Counseling Included: Importance of adherence to annual lung cancer LDCT screening. yes Impact of comorbidities on ability to participate in the program. yes Ability and willingness to under diagnostic treatment. yes  Smoking Cessation Counseling: Current Smokers:  Discussed importance of smoking cessation. yes Information about tobacco cessation classes and  interventions provided to patient. yes Patient provided with "ticket" for LDCT Scan. yes Symptomatic Patient. no  Counseling NA Diagnosis Code: Tobacco Use Z72.0 Asymptomatic Patient yes  Counseling (Intermediate counseling: > three minutes counseling) C0034 Former Smokers:  Discussed the importance of maintaining cigarette abstinence. yes Diagnosis Code: Personal History of Nicotine Dependence. J17.915 Information about tobacco cessation classes and interventions provided to patient. Yes Patient provided with "ticket" for LDCT Scan. yes Written Order for Lung Cancer Screening with LDCT placed in Epic. Yes (CT Chest Lung Cancer Screening Low Dose W/O CM) [id number] Z12.2-Screening of respiratory organs Z87.891-Personal history of nicotine dependence  I have spent 25 minutes of face to face/ virtual visit   time with  [name] discussing the risks and benefits of lung cancer screening. We viewed / discussed a power point together that explained in detail the above noted topics. We paused at intervals to allow for questions to be asked and answered to ensure understanding.We discussed that the single most powerful action that she can take to decrease her risk of developing lung cancer is to quit smoking. We discussed whether or not she is ready to commit to setting a quit date. We discussed options for tools to aid in quitting smoking including nicotine replacement therapy, non-nicotine medications, support groups, Quit Smart classes, and behavior modification. We discussed that often times setting smaller, more achievable goals, such as eliminating 1 cigarette a day for a week and then 2 cigarettes a day for a week can be helpful in slowly decreasing the number of cigarettes smoked. This allows for a sense of accomplishment as well as providing a clinical benefit. I provided  her  with smoking cessation  information  with contact information for community resources,  classes, free nicotine replacement  therapy, and access to mobile apps, text messaging, and on-line smoking cessation help. I have also provided  her  the office contact information in the event she needs to contact me, or the screening staff. We discussed the time and location of the scan, and that either Doroteo Glassman RN, Joella Prince, RN  or I will call / send a letter with the results within 24-72 hours of receiving them. The patient verbalized understanding of all of  the above and had no further questions upon leaving the office. They have my contact information in the event they have any further questions.  I spent 3 minutes counseling on smoking cessation and the health risks of continued tobacco abuse.  I explained to the patient that there has been a high incidence of coronary artery disease noted on these exams. I explained that this is a non-gated exam therefore degree or severity cannot be determined. This patient is on statin therapy. I have asked the patient to follow-up with their PCP regarding any incidental finding of coronary artery disease and management with diet or medication as their PCP  feels is clinically indicated. The patient verbalized understanding of the above and had no further questions upon completion of the visit.      Magdalen Spatz, NP 06/01/2022

## 2022-06-02 ENCOUNTER — Ambulatory Visit
Admission: RE | Admit: 2022-06-02 | Discharge: 2022-06-02 | Disposition: A | Discharge status: Home / Self Care | Payer: Medicare Other | Source: Ambulatory Visit | Attending: Acute Care | Admitting: Acute Care

## 2022-06-02 DIAGNOSIS — Z87891 Personal history of nicotine dependence: Secondary | ICD-10-CM | POA: Insufficient documentation

## 2022-06-02 DIAGNOSIS — Z122 Encounter for screening for malignant neoplasm of respiratory organs: Secondary | ICD-10-CM | POA: Insufficient documentation

## 2022-06-02 DIAGNOSIS — F17210 Nicotine dependence, cigarettes, uncomplicated: Secondary | ICD-10-CM | POA: Diagnosis present

## 2022-06-06 ENCOUNTER — Other Ambulatory Visit: Payer: Self-pay

## 2022-06-06 DIAGNOSIS — F17210 Nicotine dependence, cigarettes, uncomplicated: Secondary | ICD-10-CM

## 2022-06-06 DIAGNOSIS — Z122 Encounter for screening for malignant neoplasm of respiratory organs: Secondary | ICD-10-CM

## 2022-06-06 DIAGNOSIS — Z87891 Personal history of nicotine dependence: Secondary | ICD-10-CM

## 2022-10-12 IMAGING — DX DG PORTABLE PELVIS
1 series · 1 of 1 positions shown · non-contrast
Comparison: Pelvis radiographs 03/20/2018

CLINICAL DATA: Status post right hip replacement

EXAM:
PORTABLE PELVIS 1-2 VIEWS

[pelvis ap]
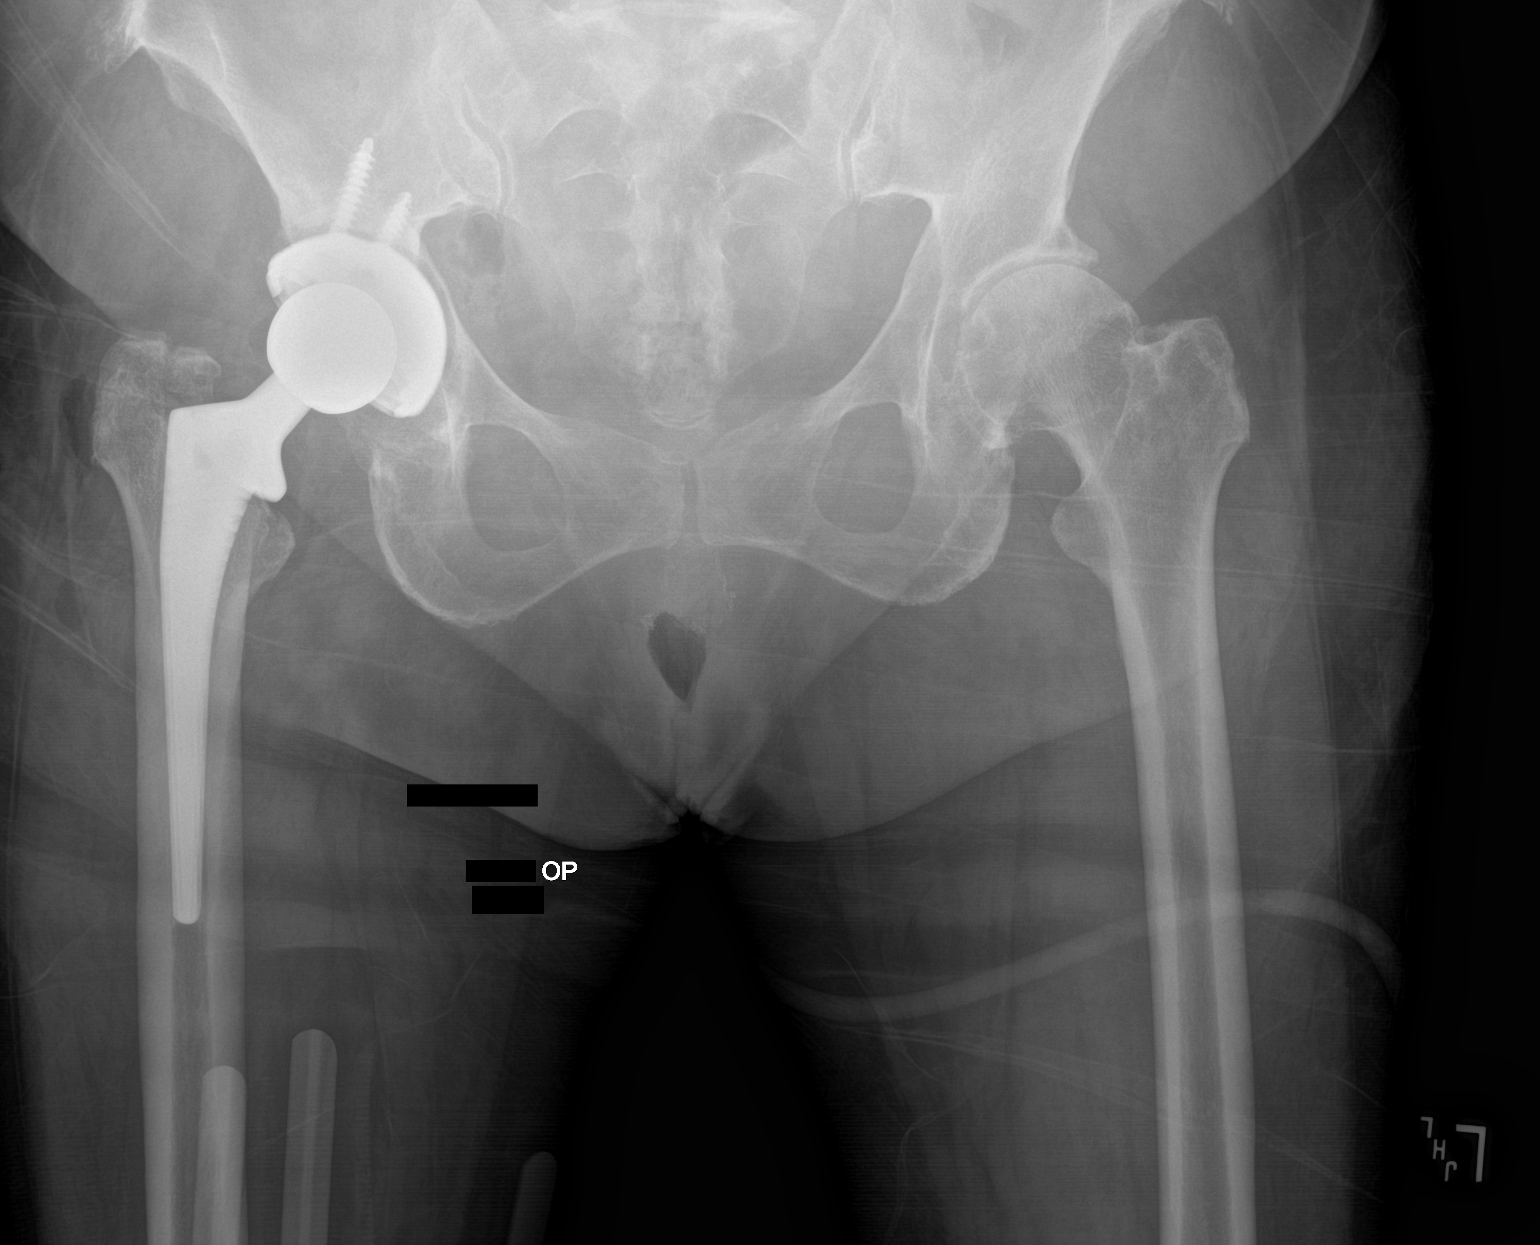

[1 of 1 positions shown; findings below may reference images not displayed]

FINDINGS: The patient is status post right hip arthroplasty. Hardware
alignment is within expected limits, without evidence of hardware
related complication. Degenerative change about the left hip is
similar to the prior study. The SI joints and symphysis pubis are
intact.
IMPRESSION: Status post right hip arthroplasty without evidence of complication.

## 2023-03-16 ENCOUNTER — Ambulatory Visit: Payer: Medicare Other | Attending: Cardiology | Admitting: Cardiology

## 2023-03-16 ENCOUNTER — Encounter: Payer: Self-pay | Admitting: Cardiology

## 2023-03-16 ENCOUNTER — Encounter: Payer: Self-pay | Admitting: Neurology

## 2023-03-16 ENCOUNTER — Ambulatory Visit
Admission: RE | Admit: 2023-03-16 | Discharge: 2023-03-16 | Disposition: A | Discharge status: Home / Self Care | Payer: Medicare Other | Source: Ambulatory Visit | Attending: Cardiology | Admitting: Cardiology

## 2023-03-16 VITALS — BP 132/78 | HR 71 | Ht 59.5 in | Wt 155.2 lb

## 2023-03-16 DIAGNOSIS — R059 Cough, unspecified: Secondary | ICD-10-CM

## 2023-03-16 DIAGNOSIS — G2581 Restless legs syndrome: Secondary | ICD-10-CM

## 2023-03-16 DIAGNOSIS — G4733 Obstructive sleep apnea (adult) (pediatric): Secondary | ICD-10-CM

## 2023-03-16 NOTE — Progress Notes (Signed)
Date:  03/16/2023   ID:  [name], DOB 1951-07-16, MRN [id number] The patient was identified using 2 identifiers.  PCP:  Noberto Retort, MD   [facility] Providers Cardiologist:  Armanda Magic, MD Sleep Medicine:  Armanda Magic, MD     Evaluation Performed:  Follow-Up Visit  Chief Complaint:  OSA  History of Present Illness:    [name] is a 72 y.o. female with a hx of OSA, obesity and RLS.  She is doing well with her PAP device and thinks that she has gotten used to it.  She tolerates the nasal mask and feels the pressure is adequate.  She goes to bed at 11pm but if she cannot get to sleep withing 45 min and gets back up for a while and then goes back to sleep.  She has problems with dry mouth and has a chin strap but has not been using it. She denies any significant nasal dryness or nasal congestion.  She does not think that she snores.  She says that she is still not sleeping as well because of her restless legs.    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   right hemidiaphragm elevation   Difficult intubation    difficult intubation week  before 2013/03/16 surgery, was not intubated for 03-16-13 surgery, but laid flat on stomach    Difficult intubation    and coded and saw dr Gloris Manchester Inocencia Murtaugh and was told she had osa and that caused code   GERD (gastroesophageal reflux disease)    Headache(784.0)    migraines occasional   Hyperlipidemia    Hypothyroidism    Multiple allergies    SEASONAL   OSA (obstructive sleep apnea)    on CPAP setting of 13   PONV (postoperative nausea  and vomiting)    has to use scop patch, coded during 03/16/13 surgery on left foot with dr hewitt, sleep apnea found   RLS (restless legs syndrome)    Tachycardia, paroxysmal (HCC)    sinus tachycardia takes atenolol   Wears glasses    Past Surgical History:  Procedure Laterality Date   ABDOMINAL HYSTERECTOMY  1988   partial   APPENDECTOMY     with hyst   BIOPSY N/A 02/02/2018   Procedure: BIOPSY;  Surgeon: Bernette Redbird, MD;  Location: [facility];  Service: Endoscopy;  Laterality: N/A;   COLONOSCOPY     x3   COLONOSCOPY WITH PROPOFOL N/A 11/04/2014   Procedure: COLONOSCOPY WITH PROPOFOL;  Surgeon: Florencia Reasons, MD;  [facility]: [facility];  Service: Endoscopy;  Laterality: N/A;   COLONOSCOPY WITH PROPOFOL N/A 02/02/2018   Procedure: COLONOSCOPY WITH PROPOFOL;  Surgeon: Bernette Redbird, MD;  [facility];  Service: Endoscopy;  Laterality: N/A;   COLONOSCOPY WITH PROPOFOL N/A 05/20/2021   Procedure: COLONOSCOPY WITH PROPOFOL;  Surgeon: Bernette Redbird, MD;  [facility];  Service: Endoscopy;  Laterality: N/A;   ELBOW BURSA SURGERY  2012   lt   GASTROCNEMIUS RECESSION Left 03/16/13  Procedure: GASTROCNEMIUS RECESSION;  Surgeon: Toni Arthurs, MD;  Location: [facility];  Service: Orthopedics;  Laterality: Left;   HEMORRHOID SURGERY  2010   [facility]   POLYPECTOMY N/A 02/02/2018   Procedure: POLYPECTOMY;  Surgeon: Bernette Redbird, MD;  [facility];  Service: Endoscopy;  Laterality: N/A;   POLYPECTOMY  05/20/2021   Procedure: POLYPECTOMY;  Surgeon: Bernette Redbird, MD;  [facility]: [facility];  Service: Endoscopy;;   STRABISMUS SURGERY     age 84 x3   TOTAL HIP ARTHROPLASTY Right 09/20/2017   Procedure: RIGHT TOTAL HIP ARTHROPLASTY ANTERIOR APPROACH;  Surgeon: Ollen Gross, MD;  [facility];  Service: Orthopedics;  Laterality: Right;   TOTAL HIP REVISION Right 07/14/2021   Procedure: Right hip acetabular vs total hip arthroplasty revision;   Surgeon: Ollen Gross, MD;  [facility]: [facility];  Service: Orthopedics;  Laterality: Right;   WISDOM TOOTH EXTRACTION       Current Meds  Medication Sig   albuterol (PROVENTIL HFA;VENTOLIN HFA) 108 (90 BASE) MCG/ACT inhaler Inhale 2 puffs into the lungs every 6 (six) hours as needed for wheezing.   aspirin 325 MG tablet Take 325 mg by mouth daily.   atenolol (TENORMIN) 25 MG tablet Take 25 mg by mouth every morning.    cetirizine-pseudoephedrine (ZYRTEC-D) 5-120 MG per tablet Take 1 tablet by mouth daily as needed for allergies.    docusate sodium (COLACE) 100 MG capsule Take 100 mg by mouth at bedtime as needed for mild constipation.   estradiol (ESTRACE) 1 MG tablet Take 1 mg by mouth daily.   fluticasone (FLONASE) 50 MCG/ACT nasal spray Place 2 sprays into both nostrils at bedtime as needed for allergies or rhinitis.   fluticasone (FLOVENT HFA) 110 MCG/ACT inhaler Inhale 2 puffs into the lungs 2 (two) times daily as needed (for shortness of breath or wheezing).   ketotifen (ZADITOR) 0.025 % ophthalmic solution Place 1 drop into both eyes 2 (two) times daily as needed (allergies).   levothyroxine (SYNTHROID) 150 MCG tablet Take 150 mcg by mouth daily.   montelukast (SINGULAIR) 10 MG tablet Take 10 mg by mouth at bedtime.   Multiple Vitamins-Minerals (CENTRUM SILVER PO) Take by mouth.   Omega-3 Fatty Acids (FISH OIL) 1360 MG CAPS Take by mouth.   oxyCODONE (OXY IR/ROXICODONE) 5 MG immediate release tablet Take 1-2 tablets (5-10 mg total) by mouth every 6 (six) hours as needed for severe pain.   rOPINIRole (REQUIP) 1 MG tablet Take 1-1.5 mg by mouth at bedtime.   solifenacin (VESICARE) 10 MG tablet Take 10 mg by mouth daily.   traMADol (ULTRAM) 50 MG tablet Take 1-2 tablets (50-100 mg total) by mouth every 6 (six) hours as needed for moderate pain.   zolmitriptan (ZOMIG) 5 MG tablet Take 5 mg by mouth as needed for migraine.     Allergies:   Atorvastatin and Dilaudid [hydromorphone hcl]    Social History   Tobacco Use   Smoking status: Every Day    Packs/day: 0.50    Years: 30.00    Additional pack years: 0.00    Total pack years: 15.00    Types: Cigarettes   Smokeless tobacco: Never  Vaping Use   Vaping Use: Never used  Substance Use Topics   Alcohol use: Yes    Comment: rare   Drug use: No     Family Hx: The patient's family history includes Arrhythmia in her father; CAD in her mother; CVA in her father; Cancer - Colon in her  paternal uncle and paternal uncle; Colon polyps in her mother; Parkinson's disease in her mother.  ROS:   Please see the history of present illness.     All other systems reviewed and are negative.   Prior CV studies:   The following studies were reviewed today:  PAP compliance download  Labs/Other Tests and Data Reviewed:    EKG:  No ECG reviewed.  Recent Labs: No results found for requested labs within last 365 days.   Recent Lipid Panel No results found for: "CHOL", "TRIG", "HDL", "CHOLHDL", "LDLCALC", "LDLDIRECT"  Wt Readings from Last 3 Encounters:  03/16/23 155 lb 3.2 oz (70.4 kg)  06/02/22 148 lb (67.1 kg)  12/02/21 148 lb (67.1 kg)     Risk Assessment/Calculations:          Objective:    Vital Signs:  BP 132/78   Pulse 71   Ht 4' 11.5" (1.511 m)   Wt 155 lb 3.2 oz (70.4 kg)   SpO2 92%   BMI 30.82 kg/m   GEN: Well nourished, well developed in no acute distress HEENT: Normal NECK: No JVD; No carotid bruits LYMPHATICS: No lymphadenopathy CARDIAC:diffuse inspiratory and expiratory wheezes and rhonchi ABDOMEN: Soft, non-tender, non-distended MUSCULOSKELETAL:  No edema; No deformity  SKIN: Warm and dry NEUROLOGIC:  Alert and oriented x 3 PSYCHIATRIC:  Normal affect  ASSESSMENT & PLAN:    OSA - The patient is tolerating PAP therapy well without any problems. The PAP download performed by his DME was personally reviewed and interpreted by me today and showed an AHI of 0.2/hr on 13 cm H2O with 77%  compliance in using more than 4 hours nightly.  The patient has been using and benefiting from PAP use and will continue to benefit from therapy.  -use chin strap to help with dry mouth  Restless Legs syndrome -she is using the Requip 3 times a day because she is getting RLS during the day as well. -I will check a Ferritin level today -I have recommended that we refer her to neuro for further recommendations -for now continue prescription drug management with Requip 1mg  at bedtime with PRN refills  Abnormal Lung sounds -she continues to smoke and had a cold in May which has left her with a chronic cough -lungs sound junky today -check PA and Lat Cxray  Medication Adjustments/Labs and Tests Ordered: Current medicines are reviewed at length with the patient today.  Concerns regarding medicines are outlined above.   Tests Ordered: No orders of the defined types were placed in this encounter.   Medication Changes: No orders of the defined types were placed in this encounter.   Follow Up:  Virtual Visit  in 1 year(s)  Signed, Armanda Magic, MD  03/16/2023 12:50 PM    [facility]

## 2023-03-16 NOTE — Addendum Note (Signed)
Addended by: Luellen Pucker on: 03/16/2023 01:10 PM   Modules accepted: Orders

## 2023-03-16 NOTE — Patient Instructions (Signed)
Medication Instructions:  Your physician recommends that you continue on your current medications as directed. Please refer to the Current Medication list given to you today.  *If you need a refill on your cardiac medications before your next appointment, please call your pharmacy*   Lab Work: Please complete a Ferritin level today in our lab before you leave.  If you have labs (blood work) drawn today and your tests are completely normal, you will receive your results only by: MyChart Message (if you have MyChart) OR A paper copy in the mail If you have any lab test that is abnormal or we need to change your treatment, we will call you to review the results.   Testing/Procedures: A chest x-ray takes a picture of the organs and structures inside the chest, including the heart, lungs, and blood vessels. This test can show several things, including, whether the heart is enlarges; whether fluid is building up in the lungs; and whether pacemaker / defibrillator leads are still in place.     Follow-Up: At [facility], you and your health needs are our priority.  As part of our continuing mission to provide you with exceptional heart care, we have created designated Provider Care Teams.  These Care Teams include your primary Cardiologist (physician) and Advanced Practice Providers (APPs -  Physician Assistants and Nurse Practitioners) who all work together to provide you with the care you need, when you need it.  We recommend signing up for the patient portal called "MyChart".  Sign up information is provided on this After Visit Summary.  MyChart is used to connect with patients for Virtual Visits (Telemedicine).  Patients are able to view lab/test results, encounter notes, upcoming appointments, etc.  Non-urgent messages can be sent to your provider as well.   To learn more about what you can do with MyChart, go to [email or web address].    Your next appointment:   1  year(s)  Provider:   Armanda Magic, MD

## 2023-03-17 LAB — FERRITIN: Ferritin: 127 ng/mL (ref 15–150)

## 2023-03-31 ENCOUNTER — Telehealth: Payer: Self-pay

## 2023-03-31 ENCOUNTER — Ambulatory Visit (INDEPENDENT_AMBULATORY_CARE_PROVIDER_SITE_OTHER): Payer: Medicare Other | Admitting: Neurology

## 2023-03-31 ENCOUNTER — Encounter: Payer: Self-pay | Admitting: Neurology

## 2023-03-31 VITALS — BP 155/76 | HR 76 | Ht 59.5 in | Wt 159.0 lb

## 2023-03-31 DIAGNOSIS — R2681 Unsteadiness on feet: Secondary | ICD-10-CM

## 2023-03-31 DIAGNOSIS — G2581 Restless legs syndrome: Secondary | ICD-10-CM

## 2023-03-31 NOTE — Progress Notes (Signed)
[facility] Clinic Note - Initial Visit   Date: 03/31/2023   [name] MRN: [id number] DOB: 1951-01-14   Dear Dr. Mayford Knife:  Thank you for your kind referral of [name] for consultation of restless leg syndrome. Although her history is well known to you, please allow [place] to reiterate it for the purpose of our medical record. The patient was accompanied to the clinic by self.     [name] is a 72 y.o. right-handed female with OSA on CPAP,  hypothyroidism, depression, hyperlipidemia, GERD, right hemidiaphragm elevation, and tobacco use presenting for evaluation of restless leg syndrome.   IMPRESSION/PLAN: Restless leg syndrome. Ferritin is normal. She has not tried any other medications, besides ropinirole.   - Adjust ropinirole to 1mg  at 2p, 1.5mg  at 9p, and take extra 1mg  as needed - If she continues to wake up at 3am, may need to increase 9p ropinirole to 2mg  - Can try gabapentin going forward, if symptoms remain poorly controlled - Try to minimize tobacco and caffeine  Gait abnormality seems to be stemming from right hip.  She has had right hip surgery and reports walking like this since this time.    - Refer to PT for gait training   Return to clinic in 3 months  ------------------------------------------------------------- History of present illness: She was diagnosed with RLS about ~2017.  She has sensation to want to move her legs and feels restless.  Movement helps alleviate symptoms.  Symptoms are worse when she wants to lay down or long car rides.  She was started on ropinirole and the dose has gradually been titrated over the years.  Currently, she takes ropinirole 1mg  at 4p, 1mg  at 9p, and 1mg  at 3am.  She has increased the dose over the past 6 months due to worsening discomfort, but does endorse that taking it this was does control her symptoms.  She is waking up every night with her legs feeling restless and tends to walk  alleviate.    She denies numbness/tingling in the feet.  Balance is ok because her walking has changed since her right hip surgery.  No recent falls.  She walks unassisted, but uses a rollator for long distances. She has low back pain, nothing which radiates into the legs. She takes oxycodone for chronic right hip pain and right shoulder pain.   She lives at home with husband.  She smokes 1PPD x 20 years.  She does not drink alcohol.  She retired Public house manager.  She had two grown children.    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   right hemidiaphragm elevation   Difficult intubation    difficult intubation week  before 02-21-13 surgery, was not intubated for 02-21-13 surgery, but laid flat on stomach    Difficult intubation    and coded and saw  dr Gloris Manchester turner and was told she had osa and that caused code   GERD (gastroesophageal reflux disease)    Headache(784.0)    migraines occasional   Hyperlipidemia    Hypothyroidism    Multiple allergies    SEASONAL   OSA (obstructive sleep apnea)    on CPAP setting of 13   PONV (postoperative nausea and vomiting)    has to use scop patch, coded during 2013-03-21 surgery on left foot with dr hewitt, sleep apnea found   RLS (restless legs syndrome)    Tachycardia, paroxysmal (HCC)    sinus tachycardia takes atenolol   Wears glasses     Past Surgical History:  Procedure Laterality  Date   ABDOMINAL HYSTERECTOMY  1988   partial   APPENDECTOMY     with hyst   BIOPSY N/A 02/02/2018   Procedure: BIOPSY;  Surgeon: Bernette Redbird, MD;  [facility];  Service: Endoscopy;  Laterality: N/A;   COLONOSCOPY     x3   COLONOSCOPY WITH PROPOFOL N/A 11/04/2014   Procedure: COLONOSCOPY WITH PROPOFOL;  Surgeon: Florencia Reasons, MD;  [facility];  Service: Endoscopy;  Laterality: N/A;   COLONOSCOPY WITH PROPOFOL N/A 02/02/2018   Procedure: COLONOSCOPY WITH PROPOFOL;  Surgeon: Bernette Redbird, MD;  [facility];  Service: Endoscopy;  Laterality: N/A;   COLONOSCOPY WITH PROPOFOL N/A 05/20/2021   Procedure: COLONOSCOPY WITH PROPOFOL;  Surgeon: Bernette Redbird, MD;  [facility];  Service: Endoscopy;  Laterality: N/A;   ELBOW BURSA SURGERY  2012   lt   GASTROCNEMIUS RECESSION Left 2013/03/21   Procedure: GASTROCNEMIUS RECESSION;  Surgeon: Toni Arthurs, MD;  Location: [facility];  Service: Orthopedics;  Laterality: Left;   HEMORRHOID SURGERY  2010   [facility]   POLYPECTOMY N/A 02/02/2018   Procedure: POLYPECTOMY;  Surgeon: Bernette Redbird, MD;  [facility];  Service: Endoscopy;  Laterality: N/A;   POLYPECTOMY  05/20/2021   Procedure: POLYPECTOMY;  Surgeon: Bernette Redbird, MD;  [facility];  Service: Endoscopy;;   STRABISMUS SURGERY     age 29 x3   TOTAL HIP ARTHROPLASTY Right 09/20/2017   Procedure: RIGHT TOTAL HIP ARTHROPLASTY ANTERIOR APPROACH;  Surgeon: Ollen Gross, MD;  [facility];  Service: Orthopedics;  Laterality: Right;   TOTAL HIP REVISION Right 07/14/2021   Procedure: Right hip acetabular vs total hip arthroplasty revision;  Surgeon: Ollen Gross, MD;  [facility];  Service: Orthopedics;  Laterality: Right;   WISDOM TOOTH EXTRACTION       Medications:  Outpatient Encounter Medications as of 03/31/2023  Medication Sig   albuterol (PROVENTIL HFA;VENTOLIN HFA) 108 (90 BASE) MCG/ACT inhaler Inhale 2  puffs into the lungs every 6 (six) hours as needed for wheezing.   aspirin 325 MG tablet Take 325 mg by mouth daily.   atenolol (TENORMIN) 25 MG tablet Take 25 mg by mouth every morning.    calcium carbonate (TUMS - DOSED IN MG ELEMENTAL CALCIUM) 500 MG chewable tablet Chew 1 tablet by mouth daily.   cetirizine (ZYRTEC) 10 MG tablet Take 10 mg by mouth daily.   estradiol (ESTRACE) 1 MG tablet Take 1 mg by mouth daily.   fluticasone (FLONASE) 50 MCG/ACT nasal spray Place 2 sprays into both nostrils at bedtime as needed for allergies or rhinitis.   fluticasone (FLOVENT HFA) 110 MCG/ACT inhaler Inhale 2 puffs into the lungs 2 (two) times daily as needed (for shortness of breath or wheezing).   ibuprofen (ADVIL) 200 MG tablet  Take 200 mg by mouth every 6 (six) hours as needed.   ketotifen (ZADITOR) 0.025 % ophthalmic solution Place 1 drop into both eyes 2 (two) times daily as needed (allergies).   levothyroxine (SYNTHROID) 150 MCG tablet Take 150 mcg by mouth daily.   montelukast (SINGULAIR) 10 MG tablet Take 10 mg by mouth at bedtime.   Multiple Vitamins-Minerals (CENTRUM SILVER PO) Take by mouth.   Omega-3 Fatty Acids (FISH OIL) 1360 MG CAPS Take by mouth.   oxyCODONE (OXY IR/ROXICODONE) 5 MG immediate release tablet Take 1-2 tablets (5-10 mg total) by mouth every 6 (six) hours as needed for severe pain.   rOPINIRole (REQUIP) 1 MG tablet Take 1-1.5 mg by mouth at bedtime.   sertraline (ZOLOFT) 100 MG tablet Take 100 mg by mouth daily.   solifenacin (VESICARE) 10 MG tablet Take 10 mg by mouth daily.   VITAMIN D, CHOLECALCIFEROL, PO Take 4,000 Units by mouth daily.   zolmitriptan (ZOMIG) 5 MG tablet Take 5 mg by mouth as needed for migraine.   [DISCONTINUED] cetirizine-pseudoephedrine (ZYRTEC-D) 5-120 MG per tablet Take 1 tablet by mouth daily as needed for allergies.  (Patient not taking: Reported on 03/31/2023)   [DISCONTINUED] docusate sodium (COLACE) 100 MG capsule Take 100 mg by mouth at bedtime  as needed for mild constipation. (Patient not taking: Reported on 03/31/2023)   [DISCONTINUED] traMADol (ULTRAM) 50 MG tablet Take 1-2 tablets (50-100 mg total) by mouth every 6 (six) hours as needed for moderate pain. (Patient not taking: Reported on 03/31/2023)   No facility-administered encounter medications on file as of 03/31/2023.    Allergies:  Allergies  Allergen Reactions   Atorvastatin Other (See Comments)    Joint Pain   Dilaudid [Hydromorphone Hcl] Nausea And Vomiting and Other (See Comments)    Tachycardia, HOT MOUTH    Family History: Family History  Problem Relation Age of Onset   Colon polyps Mother    Parkinson's disease Mother    CAD Mother    Arrhythmia Father        afib   CVA Father    Cancer - Colon Paternal Uncle    Cancer - Colon Paternal Uncle     Social History: Social History   Tobacco Use   Smoking status: Every Day    Current packs/day: 0.50    Average packs/day: 0.5 packs/day for 30.0 years (15.0 ttl pk-yrs)    Types: Cigarettes   Smokeless tobacco: Never   Tobacco comments:    Smokes about 1/2-1 pack a day   Vaping Use   Vaping status: Never Used  Substance Use Topics   Alcohol use: Yes    Comment: Occasional Glass of wine   Drug use: No   Social History   Social History Narrative   Are you right handed or left handed? Right Handed    Are you currently employed ? No    What is your current occupation? Retired   Do you live at home alone? No   Who lives with you? Lives with husband   What type of home do you live in: 1 story or 2 story? Lives in a one story home        Vital Signs:  BP (!) 155/76   Pulse 76   Ht 4' 11.5" (1.511 m)   Wt 159 lb (72.1 kg)   SpO2 92%   BMI 31.58 kg/m    Neurological Exam: MENTAL STATUS including orientation to time, place, person, recent and remote memory, attention  span and concentration, language, and fund of knowledge is normal.  Speech is not dysarthric.  CRANIAL NERVES: II:  No visual  field defects.     III-IV-VI: Pupils equal round and reactive to light.  Normal conjugate, extra-ocular eye movements in all directions of gaze.  No nystagmus.  No ptosis.   V:  Normal facial sensation.    VII:  Normal facial symmetry and movements.   VIII:  Normal hearing and vestibular function.   IX-X:  Normal palatal movement.   XI:  Normal shoulder shrug and head rotation.   XII:  Normal tongue strength and range of motion, no deviation or fasciculation.  MOTOR:  No atrophy, fasciculations or abnormal movements.  No pronator drift.   Upper Extremity:  Right  Left  Deltoid  5/5   5/5   Biceps  5/5   5/5   Triceps  5/5   5/5   Wrist extensors  5/5   5/5   Wrist flexors  5/5   5/5   Finger extensors  5/5   5/5   Finger flexors  5/5   5/5   Dorsal interossei  5/5   5/5   Abductor pollicis  5/5   5/5   Tone (Ashworth scale)  0  0   Lower Extremity:  Right  Left  Hip flexors  5/5   5/5   Knee flexors  5/5   5/5   Knee extensors  5/5   5/5   Dorsiflexors  5/5   5/5   Plantarflexors  5/5   5/5   Toe extensors  5/5   5/5   Toe flexors  5/5   5/5   Tone (Ashworth scale)  0  0   MSRs:                                           Right        Left brachioradialis 2+  2+  biceps 2+  2+  triceps 2+  2+  patellar 2+  2+  ankle jerk 2+  2+  Hoffman no  no  plantar response down  down   SENSORY:  Normal and symmetric perception of light touch, pinprick, vibration, and temperature.   COORDINATION/GAIT: Normal finger-to- nose-finger.  Intact rapid alternating movements bilaterally.  Able to rise from a chair without using arms.  Gait appears antalgic favoring the left leg.  Stressed and tandem gait intact.    Thank you for allowing me to participate in patient's care.  If I can answer any additional questions, I would be pleased to do so.    Sincerely,    Diya Gervasi K. Allena Katz, DO

## 2023-03-31 NOTE — Telephone Encounter (Signed)
Called patient and left a message to see where patient would like a Physical Therapy referral sent to. If she would like it sent to [facility] in [place] (approx 30 mins away from her address) or [facility] in [place] which is [place] mins.

## 2023-03-31 NOTE — Patient Instructions (Addendum)
You can increase ropinirole to 1.5mg  at 9pm, in hope that you do not need to take medication at 3am.  Continue ropinirole to 1mg  around 2-3pm  Try to cut back on tobacco use and caffeine

## 2023-04-03 NOTE — Telephone Encounter (Signed)
 Called patient and left a message for a call back.  

## 2023-04-04 NOTE — Telephone Encounter (Signed)
Called patient and she stated that she would like to go to  [facility] in [place] on [address].  Informed patient that I will send a referral.

## 2023-04-18 ENCOUNTER — Ambulatory Visit: Payer: Medicare Other | Admitting: Neurology

## 2023-05-19 ENCOUNTER — Telehealth: Payer: Self-pay | Admitting: Cardiology

## 2023-05-19 DIAGNOSIS — G4733 Obstructive sleep apnea (adult) (pediatric): Secondary | ICD-10-CM

## 2023-05-19 NOTE — Telephone Encounter (Signed)
Left message for patient (OK per DPR) informing her we received her message regarding her needing a new CPAP machine.  Will forward to Dr. Norris Cross nurse to address when she returns next week on 9/4.  Provided office number for callback if any questions.

## 2023-05-19 NOTE — Telephone Encounter (Signed)
New Message:      Patient says she need to let Dr Mayford Knife know this  is her anniversary and time to get a new C Pap machine. She said Dr Mayford Knife told her to call when it was that time.i

## 2023-05-26 NOTE — Telephone Encounter (Signed)
Spoke to patient to let her know new cpap machine order will be sent to DME along with order for settings as well as new mask and cpap supplies. Patient requests that she is able to keep her current model which is a "for her" model, but just get a newer machine as it works well for her. Patient request forwarded to sleep studies pool.

## 2023-05-30 NOTE — Addendum Note (Signed)
Addended by: Brunetta Genera on: 05/30/2023 11:48 AM   Modules accepted: Orders

## 2023-06-05 ENCOUNTER — Ambulatory Visit
Admission: RE | Admit: 2023-06-05 | Discharge: 2023-06-05 | Disposition: A | Discharge status: Home / Self Care | Payer: Medicare Other | Source: Ambulatory Visit | Attending: Family Medicine | Admitting: Family Medicine

## 2023-06-05 DIAGNOSIS — F17210 Nicotine dependence, cigarettes, uncomplicated: Secondary | ICD-10-CM | POA: Diagnosis present

## 2023-06-05 DIAGNOSIS — Z87891 Personal history of nicotine dependence: Secondary | ICD-10-CM | POA: Insufficient documentation

## 2023-06-05 DIAGNOSIS — Z122 Encounter for screening for malignant neoplasm of respiratory organs: Secondary | ICD-10-CM | POA: Insufficient documentation

## 2023-06-20 ENCOUNTER — Other Ambulatory Visit: Payer: Self-pay

## 2023-06-20 DIAGNOSIS — F17210 Nicotine dependence, cigarettes, uncomplicated: Secondary | ICD-10-CM

## 2023-06-20 DIAGNOSIS — Z87891 Personal history of nicotine dependence: Secondary | ICD-10-CM

## 2023-06-20 DIAGNOSIS — Z122 Encounter for screening for malignant neoplasm of respiratory organs: Secondary | ICD-10-CM

## 2023-06-28 ENCOUNTER — Telehealth: Payer: Self-pay | Admitting: Neurology

## 2023-06-28 DIAGNOSIS — R2681 Unsteadiness on feet: Secondary | ICD-10-CM

## 2023-06-28 DIAGNOSIS — G2581 Restless legs syndrome: Secondary | ICD-10-CM

## 2023-06-28 NOTE — Telephone Encounter (Signed)
Patient called and states that she needs a new referral for  [facility] in [facility]

## 2023-06-29 NOTE — Telephone Encounter (Signed)
Called patient and informed her that her referral was sent to [facility] in [place].

## 2023-07-05 ENCOUNTER — Ambulatory Visit (INDEPENDENT_AMBULATORY_CARE_PROVIDER_SITE_OTHER): Payer: Medicare Other | Admitting: Pulmonary Disease

## 2023-07-05 ENCOUNTER — Encounter: Payer: Self-pay | Admitting: Pulmonary Disease

## 2023-07-05 VITALS — BP 128/80 | HR 78 | Ht 59.0 in | Wt 156.8 lb

## 2023-07-05 DIAGNOSIS — J438 Other emphysema: Secondary | ICD-10-CM | POA: Diagnosis not present

## 2023-07-05 DIAGNOSIS — R0602 Shortness of breath: Secondary | ICD-10-CM

## 2023-07-05 MED ORDER — ANORO ELLIPTA 62.5-25 MCG/ACT IN AEPB: 1.0000 | INHALATION_SPRAY | Freq: Every day | RESPIRATORY_TRACT | 2 refills | Status: DC

## 2023-07-05 NOTE — Patient Instructions (Signed)
Prescription for Anoro sent to pharmacy for you It is used once a day regardless of how you are feeling  Use albuterol up to 4 times a day as needed  Graded exercises as tolerated  Schedule for pulmonary function test  Continue to work on quitting smoking  I will see you in about 3 months  Call us with significant concerns   If Anoro is very expensive, call us and we can try a different inhaler Another option would be Spiriva or Incruse

## 2023-07-05 NOTE — Progress Notes (Unsigned)
[name]    [id number]    [birth year]  Primary Care Physician:Harris, Tawni Pummel, MD  Referring Physician: Noberto Retort, MD [phone] [place],  [place] [address]  Chief complaint:   Shortness of breath  HPI:  Patient with a history of emphysema, obstructive sleep apnea on CPAP therapy  Shortness of breath on exertion  She has a elevated right hemidiaphragm from 2005  Winded with activities  Active smoker about a pack a day, has smoked for many years  She does have some allergies  Was recently prescribed albuterol and Qvar Does use Flonase   Outpatient Encounter Medications as of 07/05/2023  Medication Sig   albuterol (PROVENTIL HFA;VENTOLIN HFA) 108 (90 BASE) MCG/ACT inhaler Inhale 2 puffs into the lungs every 6 (six) hours as needed for wheezing.   aspirin 325 MG tablet Take 325 mg by mouth daily.   atenolol (TENORMIN) 25 MG tablet Take 25 mg by mouth every morning.    calcium carbonate (TUMS - DOSED IN MG ELEMENTAL CALCIUM) 500 MG chewable tablet Chew 1 tablet by mouth daily.   cetirizine (ZYRTEC) 10 MG tablet Take 10 mg by mouth daily.   estradiol (ESTRACE) 1 MG tablet Take 1 mg by mouth daily.   fluticasone (FLONASE) 50 MCG/ACT nasal spray Place 2 sprays into both nostrils at bedtime as needed for allergies or rhinitis.   ibuprofen (ADVIL) 200 MG tablet Take 200 mg by mouth every 6 (six) hours as needed.   ketotifen (ZADITOR) 0.025 % ophthalmic solution Place 1 drop into both eyes 2 (two) times daily as needed (allergies).   levothyroxine (SYNTHROID) 150 MCG tablet Take 150 mcg by mouth daily.   montelukast (SINGULAIR) 10 MG tablet Take 10 mg by mouth at bedtime.   Multiple Vitamins-Minerals (CENTRUM SILVER PO) Take by mouth.   Omega-3 Fatty Acids (FISH OIL) 1360 MG CAPS Take by mouth.   oxyCODONE (OXY IR/ROXICODONE) 5 MG immediate release tablet Take 1-2 tablets (5-10 mg total) by mouth every 6 (six) hours as needed for severe  pain.   rOPINIRole (REQUIP) 1 MG tablet Take 1-1.5 mg by mouth at bedtime.   sertraline (ZOLOFT) 100 MG tablet Take 100 mg by mouth daily.   solifenacin (VESICARE) 10 MG tablet Take 10 mg by mouth daily.   VITAMIN D, CHOLECALCIFEROL, PO Take 4,000 Units by mouth daily.   zolmitriptan (ZOMIG) 5 MG tablet Take 5 mg by mouth as needed for migraine.   [DISCONTINUED] fluticasone (FLOVENT HFA) 110 MCG/ACT inhaler Inhale 2 puffs into the lungs 2 (two) times daily as needed (for shortness of breath or wheezing).   No facility-administered encounter medications on file as of 07/05/2023.    Allergies as of 07/05/2023 - Review Complete 07/05/2023  Allergen Reaction Noted   Atorvastatin Other (See Comments) 03/16/2023   Dilaudid [hydromorphone hcl] Nausea And Vomiting and Other (See Comments) 02/14/2013    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   right hemidiaphragm elevation   Difficult  intubation    difficult intubation week  before Mar 14, 2013 surgery, was not intubated for 03/14/2013 surgery, but laid flat on stomach    Difficult intubation    and coded and saw dr Gloris Manchester turner and was told she had osa and that caused code   GERD (gastroesophageal reflux disease)    Headache(784.0)    migraines occasional   Hyperlipidemia    Hypothyroidism    Multiple allergies    SEASONAL   OSA (obstructive sleep apnea)    on CPAP setting of 13   PONV (postoperative nausea and vomiting)    has to use scop patch, coded during 2013/03/14 surgery on left foot with dr hewitt, sleep apnea found   RLS (restless legs syndrome)     Tachycardia, paroxysmal (HCC)    sinus tachycardia takes atenolol   Wears glasses     Past Surgical History:  Procedure Laterality Date   ABDOMINAL HYSTERECTOMY  1988   partial   APPENDECTOMY     with hyst   BIOPSY N/A 02/02/2018   Procedure: BIOPSY;  Surgeon: Bernette Redbird, MD;  Location: [facility];  Service: Endoscopy;  Laterality: N/A;   COLONOSCOPY     x3   COLONOSCOPY WITH PROPOFOL N/A 11/04/2014   Procedure: COLONOSCOPY WITH PROPOFOL;  Surgeon: Florencia Reasons, MD;  Location: [facility];  Service: Endoscopy;  Laterality: N/A;   COLONOSCOPY WITH PROPOFOL N/A 02/02/2018   Procedure: COLONOSCOPY WITH PROPOFOL;  Surgeon: Bernette Redbird, MD;  Location: [facility];  Service: Endoscopy;  Laterality: N/A;   COLONOSCOPY WITH PROPOFOL N/A 05/20/2021   Procedure: COLONOSCOPY WITH PROPOFOL;  Surgeon: Bernette Redbird, MD;  [facility]: [facility];  Service: Endoscopy;  Laterality: N/A;   ELBOW BURSA SURGERY  2012   lt   GASTROCNEMIUS RECESSION Left 2013/03/14   Procedure: GASTROCNEMIUS RECESSION;  Surgeon: Toni Arthurs, MD;  Location: [facility];  Service: Orthopedics;  Laterality: Left;   HEMORRHOID SURGERY  2010   [facility]   POLYPECTOMY N/A 02/02/2018   Procedure: POLYPECTOMY;  Surgeon: Bernette Redbird, MD;  Location: [facility];  Service: Endoscopy;  Laterality: N/A;   POLYPECTOMY  05/20/2021   Procedure: POLYPECTOMY;  Surgeon: Bernette Redbird, MD;  Location: [facility];  Service: Endoscopy;;   STRABISMUS SURGERY     age 41 x3   TOTAL HIP ARTHROPLASTY Right 09/20/2017   Procedure: RIGHT TOTAL HIP ARTHROPLASTY ANTERIOR APPROACH;  Surgeon: Ollen Gross, MD;  Location: [facility];  Service: Orthopedics;  Laterality: Right;   TOTAL HIP REVISION Right 07/14/2021   Procedure: Right hip acetabular vs total hip arthroplasty revision;  Surgeon: Ollen Gross, MD;  Location: [facility];  Service: Orthopedics;  Laterality: Right;   WISDOM TOOTH EXTRACTION      Family History  Problem  Relation Age of Onset   Colon polyps Mother    Parkinson's disease Mother    CAD Mother    Arrhythmia Father        afib   CVA Father    Cancer - Colon Paternal Uncle    Cancer - Colon Paternal Uncle     Social History   Socioeconomic History   Marital status: Married    Spouse name: Not on file   Number of children: Not on file   Years of education: Not on file   Highest education level: Not on file  Occupational History   Occupation: retired  Tobacco Use   Smoking status: Every Day    Current packs/day: 0.50    Average packs/day: 0.5  packs/day for 30.0 years (15.0 ttl pk-yrs)    Types: Cigarettes   Smokeless tobacco: Never   Tobacco comments:    Smokes about 1/2-1 pack a day   Vaping Use   Vaping status: Never Used  Substance and Sexual Activity   Alcohol use: Yes    Comment: Occasional Glass of wine   Drug use: No   Sexual activity: Not on file    Comment: trying to cut down  Other Topics Concern   Not on file  Social History Narrative   Are you right handed or left handed? Right Handed    Are you currently employed ? No    What is your current occupation? Retired   Do you live at home alone? No   Who lives with you? Lives with husband   What type of home do you live in: 1 story or 2 story? Lives in a one story home       Social Determinants of Health   Financial Resource Strain: Not on file  Food Insecurity: Not on file  Transportation Needs: Not on file  Physical Activity: Not on file  Stress: Not on file  Social Connections: Not on file  Intimate Partner Violence: Not on file    Review of Systems  Respiratory:  Positive for apnea and shortness of breath.     There were no vitals filed for this visit.   Physical Exam Constitutional:      Appearance: She is obese.  HENT:     Head: Normocephalic.     Mouth/Throat:     Mouth: Mucous membranes are moist.  Eyes:     General: No scleral icterus. Cardiovascular:     Rate and Rhythm: Normal  rate.     Heart sounds: No murmur heard.    No friction rub.  Pulmonary:     Effort: No respiratory distress.     Breath sounds: No stridor. No wheezing or rhonchi.  Musculoskeletal:     Cervical back: No rigidity or tenderness.  Neurological:     Mental Status: She is alert.  Psychiatric:        Mood and Affect: Mood normal.    Data Reviewed: Previous PFT from 2015 showed mild restrictive disease  Low-dose CT with small lung nodule, elevated right hemidiaphragm Evidence of emphysema  Assessment:  Chronic obstructive pulmonary disease/emphysema Shortness of breath on exertion  Obstructive sleep apnea on CPAP therapy  Active smoker  Elevated right hemidiaphragm  Pulm nodules  Borderline low saturations about 90-92%  Plan/Recommendations: Continue low-dose CT screening  Obtain pulmonary function test  Smoking cessation counseling  Graded activities as tolerated  Prescription for Anoro in place of Qvar  Albuterol use as needed  Encouraged to call with significant concerns  Follow-up in about 3 months   Virl Diamond MD [facility] 07/05/2023, 10:03 AM  CC: Noberto Retort, MD

## 2023-08-08 ENCOUNTER — Ambulatory Visit: Payer: Medicare Other | Attending: Cardiology | Admitting: Cardiology

## 2023-08-08 ENCOUNTER — Encounter: Payer: Self-pay | Admitting: Cardiology

## 2023-08-08 VITALS — BP 140/78 | HR 75 | Ht 59.0 in | Wt 160.6 lb

## 2023-08-08 DIAGNOSIS — G2581 Restless legs syndrome: Secondary | ICD-10-CM | POA: Diagnosis not present

## 2023-08-08 DIAGNOSIS — G4733 Obstructive sleep apnea (adult) (pediatric): Secondary | ICD-10-CM

## 2023-08-08 NOTE — Progress Notes (Signed)
Date:  08/08/2023   ID:  [name], DOB 1950/11/23, MRN [id number] The patient was identified using 2 identifiers.  PCP:  Noberto Retort, MD   [facility] Providers Cardiologist:  Armanda Magic, MD Sleep Medicine:  Armanda Magic, MD     Evaluation Performed:  Follow-Up Visit  Chief Complaint:  OSA  History of Present Illness:    [name] is a 72 y.o. female with a hx of OSA, obesity and RLS.  She is doing well with her PAP device and thinks that she has gotten used to it.  She tolerates the mask and feels the pressure is adequate.  Since going on PAP she feels rested in the am and has no significant daytime sleepiness.  She denies any significant mouth or nasal dryness or nasal congestion.  She does not think that she snores.  She has RLS and is has stabilized on the Requip 2mg  at bedtime and 1mg  in the afternoon.   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   right hemidiaphragm elevation   Difficult intubation    difficult intubation week  before Feb 27, 2013 surgery, was not intubated for 2013-02-27 surgery, but laid flat on stomach    Difficult intubation    and coded and saw dr Gloris Manchester Tymothy Cass and was told she had osa and that caused code   GERD (gastroesophageal reflux disease)    Headache(784.0)    migraines occasional   Hyperlipidemia    Hypothyroidism    Multiple allergies    SEASONAL   OSA (obstructive sleep apnea)    on CPAP setting of 13   PONV (postoperative nausea and vomiting)    has to use scop patch, coded during Feb 27, 2013 surgery on left foot with dr hewitt,  sleep apnea found   RLS (restless legs syndrome)    Tachycardia, paroxysmal (HCC)    sinus tachycardia takes atenolol   Wears glasses    Past Surgical History:  Procedure Laterality Date   ABDOMINAL HYSTERECTOMY  1988   partial   APPENDECTOMY     with hyst   BIOPSY N/A 02/02/2018   Procedure: BIOPSY;  Surgeon: Bernette Redbird, MD;  [facility];  Service: Endoscopy;  Laterality: N/A;   COLONOSCOPY     x3   COLONOSCOPY WITH PROPOFOL N/A 11/04/2014   Procedure: COLONOSCOPY WITH PROPOFOL;  Surgeon: Florencia Reasons, MD;  [facility];  Service: Endoscopy;  Laterality: N/A;   COLONOSCOPY WITH PROPOFOL N/A 02/02/2018   Procedure: COLONOSCOPY WITH PROPOFOL;  Surgeon: Bernette Redbird, MD;  [facility];  Service: Endoscopy;  Laterality: N/A;   COLONOSCOPY WITH PROPOFOL N/A 05/20/2021   Procedure: COLONOSCOPY WITH PROPOFOL;  Surgeon: Bernette Redbird, MD;  [facility];  Service: Endoscopy;  Laterality: N/A;   ELBOW BURSA SURGERY  2012   lt   GASTROCNEMIUS RECESSION Left Feb 27, 2013   Procedure: GASTROCNEMIUS RECESSION;  Surgeon: Toni Arthurs, MD;  Location: [facility];  Service: Orthopedics;  Laterality: Left;   HEMORRHOID SURGERY  2010  [facility]   POLYPECTOMY N/A 02/02/2018   Procedure: POLYPECTOMY;  Surgeon: Bernette Redbird, MD;  [facility]: [facility];  Service: Endoscopy;  Laterality: N/A;   POLYPECTOMY  05/20/2021   Procedure: POLYPECTOMY;  Surgeon: Bernette Redbird, MD;  Location: [facility];  Service: Endoscopy;;   STRABISMUS SURGERY     age 11 x3   TOTAL HIP ARTHROPLASTY Right 09/20/2017   Procedure: RIGHT TOTAL HIP ARTHROPLASTY ANTERIOR APPROACH;  Surgeon: Ollen Gross, MD;  Location: [facility];  Service: Orthopedics;  Laterality: Right;   TOTAL HIP REVISION Right 07/14/2021   Procedure: Right hip acetabular vs total hip arthroplasty revision;  Surgeon: Ollen Gross, MD;  Location: [facility];  Service: Orthopedics;  Laterality: Right;   WISDOM  TOOTH EXTRACTION       Current Meds  Medication Sig   albuterol (PROVENTIL HFA;VENTOLIN HFA) 108 (90 BASE) MCG/ACT inhaler Inhale 2 puffs into the lungs every 6 (six) hours as needed for wheezing.   aspirin 325 MG tablet Take 325 mg by mouth daily.   atenolol (TENORMIN) 25 MG tablet Take 25 mg by mouth every morning.    calcium carbonate (TUMS - DOSED IN MG ELEMENTAL CALCIUM) 500 MG chewable tablet Chew 1 tablet by mouth daily.   cetirizine (ZYRTEC) 10 MG tablet Take 10 mg by mouth daily.   estradiol (ESTRACE) 1 MG tablet Take 1 mg by mouth daily.   fluticasone (FLONASE) 50 MCG/ACT nasal spray Place 2 sprays into both nostrils at bedtime as needed for allergies or rhinitis.   ibuprofen (ADVIL) 200 MG tablet Take 200 mg by mouth every 6 (six) hours as needed.   ketotifen (ZADITOR) 0.025 % ophthalmic solution Place 1 drop into both eyes 2 (two) times daily as needed (allergies).   levothyroxine (SYNTHROID) 150 MCG tablet Take 150 mcg by mouth daily.   montelukast (SINGULAIR) 10 MG tablet Take 10 mg by mouth at bedtime.   Multiple Vitamins-Minerals (CENTRUM SILVER PO) Take by mouth.   Omega-3 Fatty Acids (FISH OIL) 1360 MG CAPS Take by mouth.   oxyCODONE (OXY IR/ROXICODONE) 5 MG immediate release tablet Take 1-2 tablets (5-10 mg total) by mouth every 6 (six) hours as needed for severe pain.   rOPINIRole (REQUIP) 1 MG tablet Take 1-1.5 mg by mouth at bedtime.   sertraline (ZOLOFT) 100 MG tablet Take 100 mg by mouth daily.   solifenacin (VESICARE) 10 MG tablet Take 10 mg by mouth daily.   umeclidinium-vilanterol (ANORO ELLIPTA) 62.5-25 MCG/ACT AEPB Inhale 1 puff into the lungs daily.   VITAMIN D, CHOLECALCIFEROL, PO Take 4,000 Units by mouth daily.   zolmitriptan (ZOMIG) 5 MG tablet Take 5 mg by mouth as needed for migraine.     Allergies:   Atorvastatin and Dilaudid [hydromorphone hcl]   Social History   Tobacco Use   Smoking status: Every Day    Current packs/day: 0.50    Average  packs/day: 0.5 packs/day for 30.0 years (15.0 ttl pk-yrs)    Types: Cigarettes   Smokeless tobacco: Never   Tobacco comments:    Smokes about 1/2-1 pack a day   Vaping Use   Vaping status: Never Used  Substance Use Topics   Alcohol use: Yes    Comment: Occasional Glass of wine   Drug use: No     Family Hx: The patient's family history includes Arrhythmia in her father; CAD in her mother; CVA in her father; Cancer - Colon in her paternal uncle and paternal uncle; Colon polyps in her mother; Parkinson's disease in  her mother.  ROS:   Please see the history of present illness.     All other systems reviewed and are negative.   Prior CV studies:   The following studies were reviewed today:  PAP compliance download  EKG Interpretation Date/Time:  Tuesday August 08 2023 10:13:14 EST Ventricular Rate:  75 PR Interval:  202 QRS Duration:  76 QT Interval:  382 QTC Calculation: 426 R Axis:   -41  Text Interpretation: Sinus rhythm with occasional Premature ventricular complexes Possible Left atrial enlargement Left axis deviation Incomplete right bundle branch block When compared with ECG of 14-Sep-2017 11:48, Premature ventricular complexes are now Present QRS axis Shifted left Confirmed by Armanda Magic ([id number]) on 08/08/2023 10:42:55 AM   Labs/Other Tests and Data Reviewed:     Recent Labs: No results found for requested labs within last 365 days.   Recent Lipid Panel No results found for: "CHOL", "TRIG", "HDL", "CHOLHDL", "LDLCALC", "LDLDIRECT"  Wt Readings from Last 3 Encounters:  08/08/23 160 lb 9.6 oz (72.8 kg)  07/05/23 156 lb 12.8 oz (71.1 kg)  03/31/23 159 lb (72.1 kg)     Risk Assessment/Calculations:          Objective:    Vital Signs:  BP (!) 140/78   Pulse 75   Ht 4\' 11"  (1.499 m)   Wt 160 lb 9.6 oz (72.8 kg)   SpO2 93%   BMI 32.44 kg/m   GEN: Well nourished, well developed in no acute distress HEENT: Normal NECK: No JVD; No carotid  bruits LYMPHATICS: No lymphadenopathy CARDIAC:RRR, no murmurs, rubs, gallops RESPIRATORY:  Clear to auscultation without rales, wheezing or rhonchi  ABDOMEN: Soft, non-tender, non-distended MUSCULOSKELETAL:  No edema; No deformity  SKIN: Warm and dry NEUROLOGIC:  Alert and oriented x 3 PSYCHIATRIC:  Normal affect  ASSESSMENT & PLAN:    OSA - The patient is tolerating PAP therapy well without any problems. The PAP download performed by his DME was personally reviewed and interpreted by me today and showed an AHI of 0.4/hr on 13 cm H2O with 73% compliance in using more than 4 hours nightly.  The patient has been using and benefiting from PAP use and will continue to benefit from therapy.   Restless Legs syndrome -continue prescription drug management with Requip 2mg  at bedtime and 1mg  in afternoon with PRN refills   Medication Adjustments/Labs and Tests Ordered: Current medicines are reviewed at length with the patient today.  Concerns regarding medicines are outlined above.   Tests Ordered: Orders Placed This Encounter  Procedures   EKG 12-Lead    Medication Changes: No orders of the defined types were placed in this encounter.   Follow Up:  Virtual Visit  in 1 year(s)  Signed, Armanda Magic, MD  08/08/2023 10:38 AM    [facility]

## 2023-08-08 NOTE — Patient Instructions (Signed)
Medication Instructions:  No changes *If you need a refill on your cardiac medications before your next appointment, please call your pharmacy*   Lab Work: none   Testing/Procedures: none   Follow-Up: At [facility], you and your health needs are our priority.  As part of our continuing mission to provide you with exceptional heart care, we have created designated Provider Care Teams.  These Care Teams include your primary Cardiologist (physician) and Advanced Practice Providers (APPs -  Physician Assistants and Nurse Practitioners) who all work together to provide you with the care you need, when you need it.   Your next appointment:   12 month(s)  Provider:   Armanda Magic, MD

## 2023-10-03 ENCOUNTER — Other Ambulatory Visit (HOSPITAL_BASED_OUTPATIENT_CLINIC_OR_DEPARTMENT_OTHER): Payer: Self-pay

## 2023-10-03 DIAGNOSIS — J438 Other emphysema: Secondary | ICD-10-CM

## 2023-10-03 DIAGNOSIS — R0602 Shortness of breath: Secondary | ICD-10-CM

## 2023-10-04 ENCOUNTER — Encounter: Payer: Self-pay | Admitting: Pulmonary Disease

## 2023-10-04 ENCOUNTER — Ambulatory Visit (HOSPITAL_BASED_OUTPATIENT_CLINIC_OR_DEPARTMENT_OTHER): Payer: Medicare Other | Admitting: Pulmonary Disease

## 2023-10-04 ENCOUNTER — Ambulatory Visit: Payer: Medicare Other | Admitting: Pulmonary Disease

## 2023-10-04 VITALS — BP 152/70 | HR 69 | Temp 98.8°F | Ht 59.0 in | Wt 151.2 lb

## 2023-10-04 DIAGNOSIS — R0602 Shortness of breath: Secondary | ICD-10-CM

## 2023-10-04 DIAGNOSIS — J441 Chronic obstructive pulmonary disease with (acute) exacerbation: Secondary | ICD-10-CM | POA: Diagnosis not present

## 2023-10-04 DIAGNOSIS — J439 Emphysema, unspecified: Secondary | ICD-10-CM

## 2023-10-04 DIAGNOSIS — F17210 Nicotine dependence, cigarettes, uncomplicated: Secondary | ICD-10-CM | POA: Diagnosis not present

## 2023-10-04 DIAGNOSIS — J438 Other emphysema: Secondary | ICD-10-CM

## 2023-10-04 LAB — PULMONARY FUNCTION TEST
DL/VA % pred: 110 %
DL/VA: 4.74 ml/min/mmHg/L
DLCO cor % pred: 66 %
DLCO cor: 10.63 ml/min/mmHg
DLCO unc % pred: 66 %
DLCO unc: 10.63 ml/min/mmHg
FEF 25-75 Post: 0.84 L/s
FEF 25-75 Pre: 1.07 L/s
FEF2575-%Change-Post: -20 %
FEF2575-%Pred-Post: 58 %
FEF2575-%Pred-Pre: 74 %
FEV1-%Change-Post: -6 %
FEV1-%Pred-Post: 56 %
FEV1-%Pred-Pre: 60 %
FEV1-Post: 0.96 L
FEV1-Pre: 1.02 L
FEV1FVC-%Change-Post: -1 %
FEV1FVC-%Pred-Pre: 111 %
FEV6-%Change-Post: -4 %
FEV6-%Pred-Post: 54 %
FEV6-%Pred-Pre: 57 %
FEV6-Post: 1.17 L
FEV6-Pre: 1.22 L
FEV6FVC-%Pred-Post: 105 %
FEV6FVC-%Pred-Pre: 105 %
FVC-%Change-Post: -4 %
FVC-%Pred-Post: 51 %
FVC-%Pred-Pre: 54 %
FVC-Post: 1.17 L
FVC-Pre: 1.22 L
Post FEV1/FVC ratio: 82 %
Post FEV6/FVC ratio: 100 %
Pre FEV1/FVC ratio: 84 %
Pre FEV6/FVC Ratio: 100 %
RV % pred: 92 %
RV: 1.87 L
TLC % pred: 76 %
TLC: 3.31 L

## 2023-10-04 MED ORDER — PREDNISONE 20 MG PO TABS: 20.0000 mg | ORAL_TABLET | Freq: Every day | ORAL | 0 refills | Status: DC

## 2023-10-04 MED ORDER — AMOXICILLIN-POT CLAVULANATE 875-125 MG PO TABS: 1.0000 | ORAL_TABLET | Freq: Two times a day (BID) | ORAL | 0 refills | Status: AC

## 2023-10-04 NOTE — Progress Notes (Signed)
 [name]    [id number]    08-22-1951  Primary Care Physician:Harris, Amiel Kalata, MD  Referring Physician: Roselind Congo, MD [phone] [address] [place],  [place] [address]  Chief complaint:   Shortness of breath  HPI:  Patient with a history of emphysema, obstructive sleep apnea on CPAP therapy  Chronic shortness of breath on exertion  Does have nasal stuffiness congestion, cough with sputum production at present, wheezing more than usual  She has a elevated right hemidiaphragm from 2005  Shortness of breath with activity  Active smoker about a pack a day, has smoked for many years  Down from a pack a day to about 10 cigarettes a day, continues to work on quitting  She does have some allergies  Was recently prescribed albuterol  and Qvar-has been on Anoro since the last visit Does use Flonase    Outpatient Encounter Medications as of 10/04/2023  Medication Sig   albuterol  (PROVENTIL  HFA;VENTOLIN  HFA) 108 (90 BASE) MCG/ACT inhaler Inhale 2 puffs into the lungs every 6 (six) hours as needed for wheezing.   aspirin  325 MG tablet Take 325 mg by mouth daily.   atenolol  (TENORMIN ) 25 MG tablet Take 25 mg by mouth every morning.    calcium  carbonate (TUMS - DOSED IN MG ELEMENTAL CALCIUM ) 500 MG chewable tablet Chew 1 tablet by mouth daily.   cetirizine (ZYRTEC) 10 MG tablet Take 10 mg by mouth daily.   estradiol  (ESTRACE ) 1 MG tablet Take 1 mg by mouth daily.   fluticasone  (FLONASE ) 50 MCG/ACT nasal spray Place 2 sprays into both nostrils at bedtime as needed for allergies or rhinitis.   ibuprofen (ADVIL) 200 MG tablet Take 200 mg by mouth every 6 (six) hours as needed.   ketotifen (ZADITOR) 0.025 % ophthalmic solution Place 1 drop into both eyes 2 (two) times daily as needed (allergies).   levothyroxine  (SYNTHROID ) 150 MCG tablet Take 150 mcg by mouth daily.   montelukast  (SINGULAIR ) 10 MG tablet Take 10 mg by mouth at bedtime.   Multiple  Vitamins-Minerals (CENTRUM SILVER PO) Take by mouth.   Omega-3 Fatty Acids (FISH OIL) 1360 MG CAPS Take by mouth.   oxyCODONE  (OXY IR/ROXICODONE ) 5 MG immediate release tablet Take 1-2 tablets (5-10 mg total) by mouth every 6 (six) hours as needed for severe pain.   rOPINIRole  (REQUIP ) 1 MG tablet Take 1-1.5 mg by mouth at bedtime.   sertraline  (ZOLOFT ) 100 MG tablet Take 100 mg by mouth daily.   solifenacin (VESICARE) 10 MG tablet Take 10 mg by mouth daily.   umeclidinium-vilanterol (ANORO ELLIPTA ) 62.5-25 MCG/ACT AEPB Inhale 1 puff into the lungs daily.   VITAMIN D , CHOLECALCIFEROL, PO Take 4,000 Units by mouth daily.   zolmitriptan (ZOMIG) 5 MG tablet Take 5 mg by mouth as needed for migraine.   No facility-administered encounter medications on file as of 10/04/2023.    Allergies as of 10/04/2023 - Review Complete 10/04/2023  Allergen Reaction Noted   Atorvastatin  Other (See Comments) 03/16/2023   Dilaudid  [hydromorphone  hcl] Nausea And Vomiting and Other (See Comments) 02/14/2013    Past Medical History:  Diagnosis Date   Adenomatous polyps    Anxiety    Arthritis    low back pain BETTER SINCE HIP SURGERY JAN 2019   Bradycardia, sinus    with cardiac arrest in setting of respiratory arrest during anesthesia - cardiac workup with echo and nuclear showed no ischemia and normal LVF  Cancer (HCC) 02/2010   SQUAMOUS CELL  cell skin cancer   Depression    Diaphragm anomaly, 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   right hemidiaphragm elevation   Difficult intubation    difficult intubation week  before Mar 22, 2013 surgery, was not intubated for 03/22/13 surgery, but laid flat on stomach    Difficult intubation    and coded and saw dr Sophia Dustman turner and was told she had osa and that caused code   GERD (gastroesophageal reflux disease)    Headache(784.0)    migraines occasional   Hyperlipidemia    Hypothyroidism    Multiple allergies    SEASONAL   OSA (obstructive sleep apnea)    on  CPAP setting of 13   PONV (postoperative nausea and vomiting)    has to use scop patch, coded during 03/22/13 surgery on left foot with dr hewitt, sleep apnea found   RLS (restless legs syndrome)    Tachycardia, paroxysmal (HCC)    sinus tachycardia takes atenolol    Wears glasses     Past Surgical History:  Procedure Laterality Date   ABDOMINAL HYSTERECTOMY  1988   partial   APPENDECTOMY     with hyst   BIOPSY N/A 02/02/2018   Procedure: BIOPSY;  Surgeon: Lanita Pitman, MD;  Location: [facility];  Service: Endoscopy;  Laterality: N/A;   COLONOSCOPY     x3   COLONOSCOPY WITH PROPOFOL  N/A 11/04/2014   Procedure: COLONOSCOPY WITH PROPOFOL ;  Surgeon: Brice Campi, MD;  Location: [facility];  Service: Endoscopy;  Laterality: N/A;   COLONOSCOPY WITH PROPOFOL  N/A 02/02/2018   Procedure: COLONOSCOPY WITH PROPOFOL ;  Surgeon: Lanita Pitman, MD;  Location: [facility];  Service: Endoscopy;  Laterality: N/A;   COLONOSCOPY WITH PROPOFOL  N/A 05/20/2021   Procedure: COLONOSCOPY WITH PROPOFOL ;  Surgeon: Lanita Pitman, MD;  Location: [facility];  Service: Endoscopy;  Laterality: N/A;   ELBOW BURSA SURGERY  2012   lt   GASTROCNEMIUS RECESSION Left 03-22-13   Procedure: GASTROCNEMIUS RECESSION;  Surgeon: Amada Backer, MD;  Location: [facility];  Service: Orthopedics;  Laterality: Left;   HEMORRHOID SURGERY  2010   [facility]   POLYPECTOMY N/A 02/02/2018   Procedure: POLYPECTOMY;  Surgeon: Lanita Pitman, MD;  Location: [facility];  Service: Endoscopy;  Laterality: N/A;   POLYPECTOMY  05/20/2021   Procedure: POLYPECTOMY;  Surgeon: Lanita Pitman, MD;  Location: [facility];  Service: Endoscopy;;   STRABISMUS SURGERY     age 35 x3   TOTAL HIP ARTHROPLASTY Right 09/20/2017   Procedure: RIGHT TOTAL HIP ARTHROPLASTY ANTERIOR APPROACH;  Surgeon: Liliane Rei, MD;  Location: [facility];  Service: Orthopedics;  Laterality: Right;   TOTAL HIP REVISION Right 07/14/2021   Procedure: Right  hip acetabular vs total hip arthroplasty revision;  Surgeon: Liliane Rei, MD;  Location: [facility];  Service: Orthopedics;  Laterality: Right;   WISDOM TOOTH EXTRACTION      Family History  Problem Relation Age of Onset   Colon polyps Mother    Parkinson's disease Mother    CAD Mother    Arrhythmia Father        afib   CVA Father    Cancer - Colon Paternal Uncle    Cancer - Colon Paternal Uncle     Social History   Socioeconomic History   Marital status: Married    Spouse name: Not on file   Number of children: Not on file   Years of education: Not on file   Highest education  level: Not on file  Occupational History   Occupation: retired  Tobacco Use   Smoking status: Every Day    Current packs/day: 0.50    Average packs/day: 0.5 packs/day for 30.0 years (15.0 ttl pk-yrs)    Types: Cigarettes   Smokeless tobacco: Never   Tobacco comments:    Smokes about 1/2-1 pack a day   Vaping Use   Vaping status: Never Used  Substance and Sexual Activity   Alcohol use: Yes    Comment: Occasional Glass of wine   Drug use: No   Sexual activity: Not on file    Comment: trying to cut down  Other Topics Concern   Not on file  Social History Narrative   Are you right handed or left handed? Right Handed    Are you currently employed ? No    What is your current occupation? Retired   Do you live at home alone? No   Who lives with you? Lives with husband   What type of home do you live in: 1 story or 2 story? Lives in a one story home       Social Drivers of Health   Financial Resource Strain: Not on file  Food Insecurity: Not on file  Transportation Needs: Not on file  Physical Activity: Not on file  Stress: Not on file  Social Connections: Not on file  Intimate Partner Violence: Not on file    Review of Systems  Respiratory:  Positive for apnea and shortness of breath.     Vitals:   10/04/23 1319  BP: (!) 152/70  Pulse: 69  Temp: 98.8 F (37.1 C)  SpO2: 93%      Physical Exam Constitutional:      Appearance: She is obese.  HENT:     Head: Normocephalic.     Mouth/Throat:     Mouth: Mucous membranes are moist.  Eyes:     General: No scleral icterus. Cardiovascular:     Rate and Rhythm: Normal rate.     Heart sounds: No murmur heard.    No friction rub.  Pulmonary:     Effort: No respiratory distress.     Breath sounds: No stridor. Rhonchi present. No wheezing.  Musculoskeletal:     Cervical back: No rigidity or tenderness.  Neurological:     Mental Status: She is alert.  Psychiatric:        Mood and Affect: Mood normal.    Data Reviewed: Previous PFT from 2015 showed mild restrictive disease  Low-dose CT with small lung nodule, elevated right hemidiaphragm Evidence of emphysema  PFT reviewed today with combined obstruction and restriction  Assessment:  Chronic obstructive pulmonary disease with exacerbation -Will benefit from a course of antibiotics  Continues to use CPAP for her obstructive sleep apnea  Active smoker -Has managed to cut down to half a pack a day  Pulmonary nodules  Borderline low saturations about 90-92%  Plan/Recommendations: Prescription for Augmentin  and prednisone  sent to pharmacy  Continue CPAP  Continue Anoro  Continue to work on quitting smoking  Albuterol  use as needed  Follow-up in 3 to 4 months from here   Myer Artis MD [facility] 10/04/2023, 1:25 PM  CC: Roselind Congo, MD

## 2023-10-04 NOTE — Patient Instructions (Signed)
 Continue using your Anoro and albuterol   Will call in a prescription for antibiotic for you  Short course of steroid  Graded activities as tolerated  Continue to cut down and possibly quit smoking if you are able to  Call us  with significant concerns  I will see you 3 to 4 months from here

## 2023-10-04 NOTE — Progress Notes (Signed)
 [name]    [id number]    09-Mar-1951  Primary Care Physician:Harris, Amiel Kalata, MD  Referring Physician: Roselind Congo, MD [phone] [address] [place],  [place] [address]  Chief complaint:   Shortness of breath  HPI:  Patient with a history of emphysema, obstructive sleep apnea on CPAP therapy  Shortness of breath on exertion  She has a elevated right hemidiaphragm from 2005  Winded with activities  Active smoker about a pack a day, has smoked for many years  She does have some allergies  Was recently prescribed albuterol  and Qvar Does use Flonase    Outpatient Encounter Medications as of 10/04/2023  Medication Sig   albuterol  (PROVENTIL  HFA;VENTOLIN  HFA) 108 (90 BASE) MCG/ACT inhaler Inhale 2 puffs into the lungs every 6 (six) hours as needed for wheezing.   aspirin  325 MG tablet Take 325 mg by mouth daily.   atenolol  (TENORMIN ) 25 MG tablet Take 25 mg by mouth every morning.    calcium  carbonate (TUMS - DOSED IN MG ELEMENTAL CALCIUM ) 500 MG chewable tablet Chew 1 tablet by mouth daily.   cetirizine (ZYRTEC) 10 MG tablet Take 10 mg by mouth daily.   estradiol  (ESTRACE ) 1 MG tablet Take 1 mg by mouth daily.   fluticasone  (FLONASE ) 50 MCG/ACT nasal spray Place 2 sprays into both nostrils at bedtime as needed for allergies or rhinitis.   ibuprofen (ADVIL) 200 MG tablet Take 200 mg by mouth every 6 (six) hours as needed.   ketotifen (ZADITOR) 0.025 % ophthalmic solution Place 1 drop into both eyes 2 (two) times daily as needed (allergies).   levothyroxine  (SYNTHROID ) 150 MCG tablet Take 150 mcg by mouth daily.   montelukast  (SINGULAIR ) 10 MG tablet Take 10 mg by mouth at bedtime.   Multiple Vitamins-Minerals (CENTRUM SILVER PO) Take by mouth.   Omega-3 Fatty Acids (FISH OIL) 1360 MG CAPS Take by mouth.   oxyCODONE  (OXY IR/ROXICODONE ) 5 MG immediate release tablet Take 1-2 tablets (5-10 mg total) by mouth every 6 (six) hours as needed for severe  pain.   rOPINIRole  (REQUIP ) 1 MG tablet Take 1-1.5 mg by mouth at bedtime.   sertraline  (ZOLOFT ) 100 MG tablet Take 100 mg by mouth daily.   solifenacin (VESICARE) 10 MG tablet Take 10 mg by mouth daily.   umeclidinium-vilanterol (ANORO ELLIPTA ) 62.5-25 MCG/ACT AEPB Inhale 1 puff into the lungs daily.   VITAMIN D , CHOLECALCIFEROL, PO Take 4,000 Units by mouth daily.   zolmitriptan (ZOMIG) 5 MG tablet Take 5 mg by mouth as needed for migraine.   No facility-administered encounter medications on file as of 10/04/2023.    Allergies as of 10/04/2023 - Review Complete 10/04/2023  Allergen Reaction Noted   Atorvastatin  Other (See Comments) 03/16/2023   Dilaudid  [hydromorphone  hcl] Nausea And Vomiting and Other (See Comments) 02/14/2013    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   right hemidiaphragm elevation   Difficult intubation    difficult intubation week  before 02-21-13 surgery, was  not intubated for 2013-03-11 surgery, but laid flat on stomach    Difficult intubation    and coded and saw dr Sophia Dustman turner and was told she had osa and that caused code   GERD (gastroesophageal reflux disease)    Headache(784.0)    migraines occasional   Hyperlipidemia    Hypothyroidism    Multiple allergies    SEASONAL   OSA (obstructive sleep apnea)    on CPAP setting of 13   PONV (postoperative nausea and vomiting)    has to use scop patch, coded during 2013-03-11 surgery on left foot with dr hewitt, sleep apnea found   RLS (restless legs syndrome)    Tachycardia, paroxysmal (HCC)    sinus tachycardia takes  atenolol    Wears glasses     Past Surgical History:  Procedure Laterality Date   ABDOMINAL HYSTERECTOMY  1988   partial   APPENDECTOMY     with hyst   BIOPSY N/A 02/02/2018   Procedure: BIOPSY;  Surgeon: Lanita Pitman, MD;  Location: [facility];  Service: Endoscopy;  Laterality: N/A;   COLONOSCOPY     x3   COLONOSCOPY WITH PROPOFOL  N/A 11/04/2014   Procedure: COLONOSCOPY WITH PROPOFOL ;  Surgeon: Brice Campi, MD;  Location: [facility];  Service: Endoscopy;  Laterality: N/A;   COLONOSCOPY WITH PROPOFOL  N/A 02/02/2018   Procedure: COLONOSCOPY WITH PROPOFOL ;  Surgeon: Lanita Pitman, MD;  Location: [facility];  Service: Endoscopy;  Laterality: N/A;   COLONOSCOPY WITH PROPOFOL  N/A 05/20/2021   Procedure: COLONOSCOPY WITH PROPOFOL ;  Surgeon: Lanita Pitman, MD;  Location: [facility];  Service: Endoscopy;  Laterality: N/A;   ELBOW BURSA SURGERY  2012   lt   GASTROCNEMIUS RECESSION Left 03-11-2013   Procedure: GASTROCNEMIUS RECESSION;  Surgeon: Amada Backer, MD;  Location: [facility];  Service: Orthopedics;  Laterality: Left;   HEMORRHOID SURGERY  2010   [facility]   POLYPECTOMY N/A 02/02/2018   Procedure: POLYPECTOMY;  Surgeon: Lanita Pitman, MD;  [facility]: [facility];  Service: Endoscopy;  Laterality: N/A;   POLYPECTOMY  05/20/2021   Procedure: POLYPECTOMY;  Surgeon: Lanita Pitman, MD;  Location: [facility];  Service: Endoscopy;;   STRABISMUS SURGERY     age 18 x3   TOTAL HIP ARTHROPLASTY Right 09/20/2017   Procedure: RIGHT TOTAL HIP ARTHROPLASTY ANTERIOR APPROACH;  Surgeon: Liliane Rei, MD;  Location: [facility];  Service: Orthopedics;  Laterality: Right;   TOTAL HIP REVISION Right 07/14/2021   Procedure: Right hip acetabular vs total hip arthroplasty revision;  Surgeon: Liliane Rei, MD;  Location: [facility];  Service: Orthopedics;  Laterality: Right;   WISDOM TOOTH EXTRACTION      Family History  Problem Relation Age of Onset   Colon polyps Mother    Parkinson's  disease Mother    CAD Mother    Arrhythmia Father        afib   CVA Father    Cancer - Colon Paternal Uncle    Cancer - Colon Paternal Uncle     Social History   Socioeconomic History   Marital status: Married    Spouse name: Not on file   Number of children: Not on file   Years of education: Not on file   Highest education level: Not on file  Occupational History   Occupation: retired  Tobacco Use   Smoking status: Every Day    Current packs/day: 0.50    Average packs/day: 0.5 packs/day for 30.0 years (15.0 ttl pk-yrs)    Types: Cigarettes  Smokeless tobacco: Never   Tobacco comments:    Smokes about 1/2-1 pack a day   Vaping Use   Vaping status: Never Used  Substance and Sexual Activity   Alcohol use: Yes    Comment: Occasional Glass of wine   Drug use: No   Sexual activity: Not on file    Comment: trying to cut down  Other Topics Concern   Not on file  Social History Narrative   Are you right handed or left handed? Right Handed    Are you currently employed ? No    What is your current occupation? Retired   Do you live at home alone? No   Who lives with you? Lives with husband   What type of home do you live in: 1 story or 2 story? Lives in a one story home       Social Drivers of Health   Financial Resource Strain: Not on file  Food Insecurity: Not on file  Transportation Needs: Not on file  Physical Activity: Not on file  Stress: Not on file  Social Connections: Not on file  Intimate Partner Violence: Not on file    Review of Systems  Respiratory:  Positive for apnea and shortness of breath.     There were no vitals filed for this visit.   Physical Exam Constitutional:      Appearance: She is obese.  HENT:     Head: Normocephalic.     Mouth/Throat:     Mouth: Mucous membranes are moist.  Eyes:     General: No scleral icterus. Cardiovascular:     Rate and Rhythm: Normal rate.     Heart sounds: No murmur heard.    No friction rub.   Pulmonary:     Effort: No respiratory distress.     Breath sounds: No stridor. No wheezing or rhonchi.  Musculoskeletal:     Cervical back: No rigidity or tenderness.  Neurological:     Mental Status: She is alert.  Psychiatric:        Mood and Affect: Mood normal.    Data Reviewed: Previous PFT from 2015 showed mild restrictive disease  Low-dose CT with small lung nodule, elevated right hemidiaphragm Evidence of emphysema  Assessment:  Chronic obstructive pulmonary disease/emphysema Shortness of breath on exertion  Obstructive sleep apnea on CPAP therapy  Active smoker  Elevated right hemidiaphragm  Pulm nodules  Borderline low saturations about 90-92%  Plan/Recommendations: Continue low-dose CT screening  Obtain pulmonary function test  Smoking cessation counseling  Graded activities as tolerated  Prescription for Anoro in place of Qvar  Albuterol  use as needed  Encouraged to call with significant concerns  Follow-up in about 3 months   Myer Artis MD [facility] 10/04/2023, 5:23 PM  CC: Roselind Congo, MD

## 2024-01-09 ENCOUNTER — Ambulatory Visit: Payer: Medicare Other | Admitting: Pulmonary Disease

## 2024-02-29 ENCOUNTER — Ambulatory Visit: Admitting: Pulmonary Disease

## 2024-02-29 ENCOUNTER — Encounter: Payer: Self-pay | Admitting: Pulmonary Disease

## 2024-02-29 VITALS — BP 154/84 | HR 76 | Ht 59.0 in | Wt 154.8 lb

## 2024-02-29 DIAGNOSIS — F17210 Nicotine dependence, cigarettes, uncomplicated: Secondary | ICD-10-CM

## 2024-02-29 DIAGNOSIS — J438 Other emphysema: Secondary | ICD-10-CM | POA: Diagnosis not present

## 2024-02-29 NOTE — Progress Notes (Signed)
 That goes to the pharmacy know I did send it so just the nebulizer to the DME company thank you              [name]    [id number]    [birth year]  Primary Care Physician:Harris, Amiel Kalata, MD  Referring Physician: Roselind Congo, MD [phone] [address] [place],  [place] [address]  Chief complaint:   Shortness of breath  HPI:  Patient with a history of emphysema, obstructive sleep apnea on CPAP therapy  Shortness of breath is better overall breathing is better Has been getting more active  Still smokes a few cigarettes a day but working on quitting  Not as short of breath since her last visit  Does have some environmental allergies  Has been tolerating Anoro well  Outpatient Encounter Medications as of 02/29/2024  Medication Sig   albuterol  (PROVENTIL  HFA;VENTOLIN  HFA) 108 (90 BASE) MCG/ACT inhaler Inhale 2 puffs into the lungs every 6 (six) hours as needed for wheezing.   aspirin  325 MG tablet Take 325 mg by mouth daily.   atenolol  (TENORMIN ) 25 MG tablet Take 25 mg by mouth every morning.    calcium  carbonate (TUMS - DOSED IN MG ELEMENTAL CALCIUM ) 500 MG chewable tablet Chew 1 tablet by mouth daily.   cetirizine (ZYRTEC) 10 MG tablet Take 10 mg by mouth daily.   estradiol  (ESTRACE ) 1 MG tablet Take 1 mg by mouth daily.   fluticasone  (FLONASE ) 50 MCG/ACT nasal spray Place 2 sprays into both nostrils at bedtime as needed for allergies or rhinitis.   ibuprofen (ADVIL) 200 MG tablet Take 200 mg by mouth every 6 (six) hours as needed.   ketotifen (ZADITOR) 0.025 % ophthalmic solution Place 1 drop into both eyes 2 (two) times daily as needed (allergies).   levothyroxine  (SYNTHROID ) 150 MCG tablet Take 150 mcg by mouth daily.   montelukast  (SINGULAIR ) 10 MG tablet Take 10 mg by mouth at bedtime.   Multiple Vitamins-Minerals (CENTRUM SILVER PO) Take by mouth.   Omega-3 Fatty Acids (FISH OIL) 1360 MG CAPS Take by mouth.   oxyCODONE  (OXY IR/ROXICODONE ) 5 MG immediate  release tablet Take 1-2 tablets (5-10 mg total) by mouth every 6 (six) hours as needed for severe pain.   rOPINIRole  (REQUIP ) 1 MG tablet Take 1-1.5 mg by mouth at bedtime.   sertraline  (ZOLOFT ) 100 MG tablet Take 100 mg by mouth daily.   solifenacin (VESICARE) 10 MG tablet Take 10 mg by mouth daily.   umeclidinium-vilanterol (ANORO ELLIPTA ) 62.5-25 MCG/ACT AEPB Inhale 1 puff into the lungs daily.   VITAMIN D , CHOLECALCIFEROL, PO Take 4,000 Units by mouth daily.   zolmitriptan (ZOMIG) 5 MG tablet Take 5 mg by mouth as needed for migraine.   predniSONE  (DELTASONE ) 20 MG tablet Take 1 tablet (20 mg total) by mouth daily with breakfast. (Patient not taking: Reported on 02/29/2024)   No facility-administered encounter medications on file as of 02/29/2024.    Allergies as of 02/29/2024 - Review Complete 02/29/2024  Allergen Reaction Noted   Atorvastatin  Other (See Comments) 03/16/2023   Dilaudid  [hydromorphone  hcl] Nausea And Vomiting and Other (See Comments) 02/14/2013    Past Medical History:  Diagnosis Date   Adenomatous polyps    Anxiety    Arthritis    low back pain BETTER SINCE HIP SURGERY JAN 2019   Bradycardia, sinus    with cardiac arrest in setting of respiratory arrest during anesthesia - cardiac workup with echo and nuclear showed no  ischemia and normal LVF   Cancer (HCC) 02/2010   SQUAMOUS CELL  cell skin cancer   Depression    Diaphragm anomaly, 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   right hemidiaphragm elevation   Difficult intubation    difficult intubation week  before March 16, 2013 surgery, was not intubated for 03-16-2013 surgery, but laid flat on stomach    Difficult intubation    and coded and saw dr Sophia Dustman turner and was told she had osa and that caused code   GERD (gastroesophageal reflux disease)    Headache(784.0)    migraines occasional   Hyperlipidemia    Hypothyroidism    Multiple allergies    SEASONAL   OSA (obstructive sleep apnea)    on CPAP setting of 13    PONV (postoperative nausea and vomiting)    has to use scop patch, coded during 2013-03-16 surgery on left foot with dr hewitt, sleep apnea found   RLS (restless legs syndrome)    Tachycardia, paroxysmal (HCC)    sinus tachycardia takes atenolol    Wears glasses     Past Surgical History:  Procedure Laterality Date   ABDOMINAL HYSTERECTOMY  1988   partial   APPENDECTOMY     with hyst   BIOPSY N/A 02/02/2018   Procedure: BIOPSY;  Surgeon: Lanita Pitman, MD;  Location: [facility];  Service: Endoscopy;  Laterality: N/A;   COLONOSCOPY     x3   COLONOSCOPY WITH PROPOFOL  N/A 11/04/2014   Procedure: COLONOSCOPY WITH PROPOFOL ;  Surgeon: Brice Campi, MD;  Location: [facility];  Service: Endoscopy;  Laterality: N/A;   COLONOSCOPY WITH PROPOFOL  N/A 02/02/2018   Procedure: COLONOSCOPY WITH PROPOFOL ;  Surgeon: Lanita Pitman, MD;  Location: [facility];  Service: Endoscopy;  Laterality: N/A;   COLONOSCOPY WITH PROPOFOL  N/A 05/20/2021   Procedure: COLONOSCOPY WITH PROPOFOL ;  Surgeon: Lanita Pitman, MD;  Location: [facility];  Service: Endoscopy;  Laterality: N/A;   ELBOW BURSA SURGERY  2012   lt   GASTROCNEMIUS RECESSION Left 16-Mar-2013   Procedure: GASTROCNEMIUS RECESSION;  Surgeon: Amada Backer, MD;  Location: [facility];  Service: Orthopedics;  Laterality: Left;   HEMORRHOID SURGERY  2010   [facility]   POLYPECTOMY N/A 02/02/2018   Procedure: POLYPECTOMY;  Surgeon: Lanita Pitman, MD;  Location: [facility];  Service: Endoscopy;  Laterality: N/A;   POLYPECTOMY  05/20/2021   Procedure: POLYPECTOMY;  Surgeon: Lanita Pitman, MD;  Location: [facility];  Service: Endoscopy;;   STRABISMUS SURGERY     age 5 x3   TOTAL HIP ARTHROPLASTY Right 09/20/2017   Procedure: RIGHT TOTAL HIP ARTHROPLASTY ANTERIOR APPROACH;  Surgeon: Liliane Rei, MD;  Location: [facility];  Service: Orthopedics;  Laterality: Right;   TOTAL HIP REVISION Right 07/14/2021   Procedure: Right hip acetabular vs total  hip arthroplasty revision;  Surgeon: Liliane Rei, MD;  Location: [facility];  Service: Orthopedics;  Laterality: Right;   WISDOM TOOTH EXTRACTION      Family History  Problem Relation Age of Onset   Colon polyps Mother    Parkinson's disease Mother    CAD Mother    Arrhythmia Father        afib   CVA Father    Cancer - Colon Paternal Uncle    Cancer - Colon Paternal Uncle     Social History   Socioeconomic History   Marital status: Married    Spouse name: Not on file   Number of children: Not on file   Years of education: Not  on file   Highest education level: Not on file  Occupational History   Occupation: retired  Tobacco Use   Smoking status: Every Day    Current packs/day: 0.50    Average packs/day: 0.5 packs/day for 30.0 years (15.0 ttl pk-yrs)    Types: Cigarettes   Smokeless tobacco: Never   Tobacco comments:    Smokes about 1/2-1 pack a day. Trying to quit   Vaping Use   Vaping status: Never Used  Substance and Sexual Activity   Alcohol use: Yes    Comment: Occasional Glass of wine   Drug use: No   Sexual activity: Not on file    Comment: trying to cut down  Other Topics Concern   Not on file  Social History Narrative   Are you right handed or left handed? Right Handed    Are you currently employed ? No    What is your current occupation? Retired   Do you live at home alone? No   Who lives with you? Lives with husband   What type of home do you live in: 1 story or 2 story? Lives in a one story home       Social Drivers of Health   Financial Resource Strain: Not on file  Food Insecurity: Not on file  Transportation Needs: Not on file  Physical Activity: Not on file  Stress: Not on file  Social Connections: Not on file  Intimate Partner Violence: Not on file    Review of Systems  Respiratory:  Positive for apnea and shortness of breath.   Psychiatric/Behavioral:  Positive for sleep disturbance.     Vitals:   02/29/24 1542  BP: (!) 154/84   Pulse: 76  SpO2: 95%     Physical Exam Constitutional:      Appearance: She is obese.  HENT:     Head: Normocephalic.     Mouth/Throat:     Mouth: Mucous membranes are moist.   Eyes:     General: No scleral icterus.   Cardiovascular:     Rate and Rhythm: Normal rate.     Heart sounds: No murmur heard.    No friction rub.  Pulmonary:     Effort: No respiratory distress.     Breath sounds: No stridor. No wheezing or rhonchi.   Musculoskeletal:     Cervical back: No rigidity or tenderness.   Neurological:     Mental Status: She is alert.   Psychiatric:        Mood and Affect: Mood normal.    Data Reviewed: Previous PFT from 2015 showed mild restrictive disease  Low-dose CT with small lung nodule, elevated right hemidiaphragm Evidence of emphysema  PFT reviewed today with combined obstruction and restriction  Assessment:  Chronic obstructive pulmonary disease with stable findings at present - Continue Anoro  Continue CPAP use for obstructive sleep apnea  Active smoker - Continue working on quitting smoking  Pulmonary nodules   Plan/Recommendations: Follow-up in about 6 months  Continue CPAP  Continue Anoro  Call us  as needed    Myer Artis MD [facility] 02/29/2024, 3:51 PM  CC: Roselind Congo, MD

## 2024-02-29 NOTE — Patient Instructions (Addendum)
 Continue Anoro  Continue working on quitting smoking  Check out the book by Theodis Fiscal on ways to quit smoking  Continue graded activities as tolerated  Call us  with significant concerns  I will see you 6 months from here

## 2024-06-07 ENCOUNTER — Ambulatory Visit
Admission: RE | Admit: 2024-06-07 | Discharge: 2024-06-07 | Disposition: A | Discharge status: Home / Self Care | Source: Ambulatory Visit | Attending: Acute Care | Admitting: Acute Care

## 2024-06-07 DIAGNOSIS — Z122 Encounter for screening for malignant neoplasm of respiratory organs: Secondary | ICD-10-CM | POA: Insufficient documentation

## 2024-06-07 DIAGNOSIS — F17210 Nicotine dependence, cigarettes, uncomplicated: Secondary | ICD-10-CM | POA: Diagnosis present

## 2024-06-07 DIAGNOSIS — Z87891 Personal history of nicotine dependence: Secondary | ICD-10-CM | POA: Insufficient documentation

## 2024-06-17 ENCOUNTER — Telehealth: Payer: Self-pay | Admitting: Acute Care

## 2024-06-17 ENCOUNTER — Other Ambulatory Visit: Payer: Self-pay

## 2024-06-17 DIAGNOSIS — Z122 Encounter for screening for malignant neoplasm of respiratory organs: Secondary | ICD-10-CM

## 2024-06-17 DIAGNOSIS — Z87891 Personal history of nicotine dependence: Secondary | ICD-10-CM

## 2024-06-17 DIAGNOSIS — F17210 Nicotine dependence, cigarettes, uncomplicated: Secondary | ICD-10-CM

## 2024-06-17 NOTE — Telephone Encounter (Signed)
 Order placed. Results sent to PCP.

## 2024-06-17 NOTE — Telephone Encounter (Signed)
 I have called the patient with th results of her low dose Ct chest. Her scan was read as a LR 2, 12 month follow up LDCT. There was an incidental finding of a nodular density along the lesser curvature of the stomach measuring 1.8 x 1.6 cm, which may represent a GI stromal tumor, prominent lymph node, or less likely diverticulum. Recommend further evaluation with contrast-enhanced CT of the abdomen.  From a lung cancer perspective, we will order and follow the 12 month annual scan. Please order scan for 05/2025, I will fax results to PCP.   I will defer to Dr. Arloa to order and follow the contrast Ct chest if he feels it is clinically indicated. I have called his office to make them aware. I spoke with Xenia, who will get the message to him. I let her know I had already spoken to Ms. Petrasek.  Thanks so much

## 2024-06-20 ENCOUNTER — Other Ambulatory Visit: Payer: Self-pay | Admitting: Family Medicine

## 2024-06-20 DIAGNOSIS — K3189 Other diseases of stomach and duodenum: Secondary | ICD-10-CM

## 2024-06-25 ENCOUNTER — Ambulatory Visit
Admission: RE | Admit: 2024-06-25 | Discharge: 2024-06-25 | Disposition: A | Source: Ambulatory Visit | Attending: Family Medicine | Admitting: Family Medicine

## 2024-06-25 DIAGNOSIS — K3189 Other diseases of stomach and duodenum: Secondary | ICD-10-CM

## 2024-06-25 MED ORDER — IOPAMIDOL (ISOVUE-300) INJECTION 61%
100.0000 mL | Freq: Once | INTRAVENOUS | Status: AC | PRN
Start: 2024-06-25 — End: 2024-06-25
  Administered 2024-06-25: 100 mL via INTRAVENOUS

## 2024-06-27 ENCOUNTER — Other Ambulatory Visit: Payer: Self-pay | Admitting: Pulmonary Disease

## 2024-08-23 ENCOUNTER — Telehealth (HOSPITAL_BASED_OUTPATIENT_CLINIC_OR_DEPARTMENT_OTHER): Payer: Self-pay

## 2024-08-23 NOTE — Telephone Encounter (Signed)
 Patient is scheduled to see Orren Fabry, PA on 09/26/24 at 1:55 PM.

## 2024-08-23 NOTE — Telephone Encounter (Signed)
   Name: [name]  DOB: 1951-03-04  MRN: [id number]  Primary Cardiologist: Wilbert Bihari, MD  Chart reviewed as part of pre-operative protocol coverage. Because of [name]'s past medical history and time since last visit, she will require a follow-up in-office visit in order to better assess preoperative cardiovascular risk. Last OV >1 Year ago. Would schedule with Dr. Bihari if possible since she is also in need for OSA follow-up. However, if no upcoming appointments available, please schedule with APP for cardiac clearance and then a separate next available with Dr. Bihari for sleep apnea follow-up.  Pre-op covering staff: - Please schedule appointment and call patient to inform them. If patient already had an upcoming appointment within acceptable timeframe, please add pre-op clearance to the appointment notes so provider is aware. - Please contact requesting surgeon's office via preferred method (i.e, phone, fax) to inform them of need for appointment prior to surgery.  Regarding ASA, no acute cardiac contraindication identified to holding but will need to be reviewed at upcoming OV.  Kelly Baksh N Coralie Stanke, PA-C  08/23/2024, 11:06 AM

## 2024-08-23 NOTE — Telephone Encounter (Signed)
 Attempt called patient to schedule a office visit for pre-op clearance. No answer left a vm to call back.---!st attempt

## 2024-08-23 NOTE — Telephone Encounter (Signed)
   Pre-operative Risk Assessment    Patient Name: [name]  DOB: 1951-07-12 MRN: [id number]   Date of last office visit: 08/08/23 with Dr. Shlomo Date of next office visit: NA  Request for Surgical Clearance    Procedure:  Right reverse total shoulder arthroplasty  Date of Surgery:  Clearance TBD                                 Surgeon:  Dr. Sharl Socks Group or Practice Name:  [facility] Phone number:  [phone] Fax number:  [phone]   Type of Clearance Requested:   - Medical  - Pharmacy:  Hold Aspirin  not indicated   Type of Anesthesia:  choice   Additional requests/questions:    SignedAugustin JONETTA Daring   08/23/2024, 8:22 AM

## 2024-08-26 NOTE — Telephone Encounter (Signed)
 Pt has appt 09/26/24 Orren Fabry, PAC.

## 2024-09-25 NOTE — Progress Notes (Addendum)
 " Cardiology Office Note   Date:  09/26/2024  ID:  [name], DOB December 06, 1950, MRN [id number] PCP: Arloa Elsie JONELLE, MD  [facility] Providers Cardiologist:  Wilbert Bihari, MD Sleep Medicine:  Wilbert Bihari, MD   History of Present Illness UNKNOWN [name] is a 74 y.o. female with past medical history of OSA, obesity, and RLS here for follow-up appointment.  She is also here for cardiac clearance.  She was seen by Dr. Bihari 08/08/2023 and was doing well with her PAP device at that time.  She thought she was getting used to it.  Tolerating the mask without the pressure was adequate.  Since going on PAP she feels rested in the morning and has no significant daytime sleepiness.  Denies any significant mouth or nasal dryness.  Does not think that she snores.  Also has RLS and is stabilized on Requip  2 mg bedtime and 1 g in the afternoon.  Today, she presents for preop clearance with decreased exercise tolerance and shortness of breath.  Over the past few years she has had progressive decline in exercise tolerance with increased shortness of breath and fatigue. She can walk one block before needing to stop due to leg fatigue. She can climb one flight of stairs and do household tasks but does not participate in recreational activities.  She has mild emphysema and uses daily inhalers including Singulair  and Ellipta, with albuterol  as needed but infrequently.  She takes aspirin  325 mg daily for prior elevated CRP on a past heart test. She takes fish oil for cholesterol. Her August lipid panel showed triglycerides 187 mg/dL and LDL 869 mg/dL. She stopped atorvastatin  due to severe joint pain and restless legs.  She has sleep apnea and uses CPAP without new problems.  She had a hip replacement and has gait instability, which limits her walking distance.  In 2014 she had a cardiac arrest during surgery for a ruptured Achilles tendon attributed to low oxygen while under sedation.  Reports  no shortness of breath nor dyspnea on exertion.  No edema, orthopnea, PND. Reports no palpitations.   Discussed the use of AI scribe software for clinical note transcription with the patient, who gave verbal consent to proceed.  ROS: Pertinent ROS in HPI  Studies Reviewed EKG Interpretation Date/Time:  Thursday September 26 2024 13:53:45 EST Ventricular Rate:  61 PR Interval:  184 QRS Duration:  86 QT Interval:  428 QTC Calculation: 430 R Axis:   -45  Text Interpretation: Normal sinus rhythm Possible Left atrial enlargement Left anterior fascicular block Inferior infarct , age undetermined Possible Anterior infarct , age undetermined When compared with ECG of 08-Aug-2023 10:13, Premature ventricular complexes are no longer Present Inferior infarct is now Present Confirmed by Lucien Blanc [phone]) on 09/26/2024 4:56:08 PM    No recent testing reviewed.     Physical Exam VS:  BP (!) 168/82   Pulse 64   Ht 4' 11.75 (1.518 m)   Wt 156 lb (70.8 kg)   SpO2 92%   BMI 30.72 kg/m        Wt Readings from Last 3 Encounters:  09/26/24 156 lb (70.8 kg)  02/29/24 154 lb 12.8 oz (70.2 kg)  10/04/23 151 lb 3.2 oz (68.6 kg)    GEN: Well nourished, well developed in no acute distress NECK: No JVD; No carotid bruits CARDIAC: RRR, no murmurs, rubs, gallops RESPIRATORY:  Clear to auscultation without rales, wheezing or rhonchi  ABDOMEN: Soft, non-tender, non-distended EXTREMITIES:  No  edema; No deformity   ASSESSMENT AND PLAN  Preop clearance  [name]'s perioperative risk of a major cardiac event is 0.4% according to the Revised Cardiac Risk Index (RCRI).  Therefore, she is at low risk for perioperative complications.   Her functional capacity is fair at 4.73 METs according to the Duke Activity Status Index (DASI). Recommendations: Due to her SOB and decreased exercise tolerance, an echo will need to be preformed prior to clearance.  Antiplatelet and/or Anticoagulation  Recommendations: Aspirin  can be held for 5-7 days prior to her surgery.  Please resume Aspirin  post operatively when it is felt to be safe from a bleeding standpoint.    Diastolic dysfunction with left ventricular hypertrophy Previous echocardiogram noted diastolic dysfunction with left ventricular hypertrophy. Symptoms may relate to cardiac issues. Echocardiogram will assess current function and rule out progression. - Ordered echocardiogram to evaluate current cardiac function.  Emphysema Mild emphysema with obstructive lung disease. Wheezing noted, contributing to shortness of breath. - Continue current inhaler regimen.  Hyperlipidemia Elevated LDL and triglycerides. Previous atorvastatin  intolerance. Discussed dietary modifications as first-line approach. Options include low-dose rosuvastatin, Zetia, or injectable therapies if needed. Goal: LDL <100, triglycerides <150. - Implement dietary modifications following a Mediterranean diet. - Will recheck lipid panel in April. - Provided literature on Mediterranean diet.   ADDENDUM:  Your heart pump function looks good.  No regional wall motion abnormalities (good news).  You did have mild thickening of your left lower chamber of your heart.  This is common with hypertension or high blood pressure.  Your mitral valve is normal.  Aortic valve also normal.  Overall, unremarkable echo.  I do not think your shortness of breath is coming from your heart.  I think you are fine to move forward with your upcoming shoulder surgery.  I will add this to my note and send it over to your surgeon today.  Thanks.  She can move forward with shoulder surgery without any additional testing.  Will update requesting party.     Dispo: She can follow-up in 6 months with MD  Signed, Orren LOISE Fabry, PA-C   "

## 2024-09-26 ENCOUNTER — Ambulatory Visit: Attending: Physician Assistant | Admitting: Physician Assistant

## 2024-09-26 ENCOUNTER — Encounter: Payer: Self-pay | Admitting: Physician Assistant

## 2024-09-26 VITALS — BP 168/82 | HR 64 | Ht 59.75 in | Wt 156.0 lb

## 2024-09-26 DIAGNOSIS — J439 Emphysema, unspecified: Secondary | ICD-10-CM

## 2024-09-26 DIAGNOSIS — E782 Mixed hyperlipidemia: Secondary | ICD-10-CM | POA: Diagnosis not present

## 2024-09-26 DIAGNOSIS — G4733 Obstructive sleep apnea (adult) (pediatric): Secondary | ICD-10-CM | POA: Diagnosis not present

## 2024-09-26 DIAGNOSIS — G2581 Restless legs syndrome: Secondary | ICD-10-CM

## 2024-09-26 DIAGNOSIS — Z01810 Encounter for preprocedural cardiovascular examination: Secondary | ICD-10-CM | POA: Diagnosis not present

## 2024-09-26 NOTE — Patient Instructions (Signed)
 Medication Instructions:  Your physician recommends that you continue on your current medications as directed. Please refer to the Current Medication list given to you today. *If you need a refill on your cardiac medications before your next appointment, please call your pharmacy*  Lab Work: FASTING LIPIDS IN 8-12 WEEKS (11/21/24-12/19/24) If you have labs (blood work) drawn today and your tests are completely normal, you will receive your results only by: MyChart Message (if you have MyChart) OR A paper copy in the mail If you have any lab test that is abnormal or we need to change your treatment, we will call you to review the results.  Testing/Procedures: Your physician has requested that you have an echocardiogram. Echocardiography is a painless test that uses sound waves to create images of your heart. It provides your doctor with information about the size and shape of your heart and how well your hearts chambers and valves are working. This procedure takes approximately one hour. There are no restrictions for this procedure. Please do NOT wear cologne, perfume, aftershave, or lotions (deodorant is allowed). Please arrive 15 minutes prior to your appointment time.  Please note: We ask at that you not bring children with you during ultrasound (echo/ vascular) testing. Due to room size and safety concerns, children are not allowed in the ultrasound rooms during exams. Our front office staff cannot provide observation of children in our lobby area while testing is being conducted. An adult accompanying a patient to their appointment will only be allowed in the ultrasound room at the discretion of the ultrasound technician under special circumstances. We apologize for any inconvenience.  Follow-Up: At [facility], you and your health needs are our priority.  As part of our continuing mission to provide you with exceptional heart care, our providers are all part of one team.  This team  includes your primary Cardiologist (physician) and Advanced Practice Providers or APPs (Physician Assistants and Nurse Practitioners) who all work together to provide you with the care you need, when you need it.  Your next appointment:   6 month(s)  Provider:   Wilbert Bihari, MD    We recommend signing up for the patient portal called MyChart.  Sign up information is provided on this After Visit Summary.  MyChart is used to connect with patients for Virtual Visits (Telemedicine).  Patients are able to view lab/test results, encounter notes, upcoming appointments, etc.  Non-urgent messages can be sent to your provider as well.   To learn more about what you can do with MyChart, go to [email or web address].   Other Instructions

## 2024-09-27 ENCOUNTER — Ambulatory Visit: Payer: Self-pay | Admitting: Physician Assistant

## 2024-09-27 ENCOUNTER — Ambulatory Visit (HOSPITAL_COMMUNITY)
Admission: RE | Admit: 2024-09-27 | Discharge: 2024-09-27 | Disposition: A | Source: Ambulatory Visit | Attending: Physician Assistant | Admitting: Physician Assistant

## 2024-09-27 DIAGNOSIS — Z01810 Encounter for preprocedural cardiovascular examination: Secondary | ICD-10-CM

## 2024-09-27 DIAGNOSIS — G2581 Restless legs syndrome: Secondary | ICD-10-CM

## 2024-09-27 DIAGNOSIS — G4733 Obstructive sleep apnea (adult) (pediatric): Secondary | ICD-10-CM

## 2024-09-27 LAB — ECHOCARDIOGRAM COMPLETE
Area-P 1/2: 3.12 cm2
S' Lateral: 2.76 cm

## 2024-10-18 ENCOUNTER — Ambulatory Visit: Admitting: Podiatry

## 2024-10-18 ENCOUNTER — Ambulatory Visit (INDEPENDENT_AMBULATORY_CARE_PROVIDER_SITE_OTHER)

## 2024-10-18 DIAGNOSIS — M7751 Other enthesopathy of right foot: Secondary | ICD-10-CM | POA: Diagnosis not present

## 2024-10-18 DIAGNOSIS — D2371 Other benign neoplasm of skin of right lower limb, including hip: Secondary | ICD-10-CM

## 2024-10-18 DIAGNOSIS — M795 Residual foreign body in soft tissue: Secondary | ICD-10-CM | POA: Diagnosis not present

## 2024-10-18 DIAGNOSIS — B351 Tinea unguium: Secondary | ICD-10-CM | POA: Diagnosis not present

## 2024-10-18 NOTE — Progress Notes (Signed)
 Subjective:   Patient ID: [name], female   DOB: 74 y.o.   MRN: [id number]   HPI Chief Complaint  Patient presents with   Plantar Warts    RT heel has a corn/callous. Also has question regarding possible toenail fungus on LT 1st hallux. Not diabetic. Takes asa 325 mg.    74 year old female presents the office the above concerns.  She said that she has a area of concern about a callus or wart on the bottom of the right foot wound along the heel.  The area is tender with pressure.  This started in December but she does not recall any injuries or stepping any foreign objects to this area.  No drainage or pus or redness or warmth.  She also said that she had a splinter in her right big toe which she thinks she got out.  She like me to check this while she is here.  She also recently started about a month ago on topical medication for her left big toenail fungus.  This it was a compound cream that was ordered.    Review of Systems  All other systems reviewed and are negative.  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   right hemidiaphragm elevation   Difficult intubation    difficult intubation week  before 03-08-13 surgery, was not intubated for March 08, 2013 surgery, but laid flat on stomach    Difficult intubation    and coded and saw dr wilbert turner and was told she had osa and that caused code   GERD (gastroesophageal reflux disease)    Headache(784.0)    migraines occasional   Hyperlipidemia    Hypothyroidism    Multiple allergies    SEASONAL   OSA (obstructive sleep apnea)    on CPAP setting  of 13   PONV (postoperative nausea and vomiting)    has to use scop patch, coded during 03-08-2013 surgery on left foot with dr hewitt, sleep apnea found   RLS (restless legs syndrome)    Tachycardia, paroxysmal (HCC)    sinus tachycardia takes atenolol    Wears glasses     Past Surgical History:  Procedure Laterality Date   ABDOMINAL HYSTERECTOMY  1988   partial   APPENDECTOMY     with hyst   BIOPSY N/A 02/02/2018   Procedure: BIOPSY;  Surgeon: Donnald Charleston, MD;  Location: [facility];  Service: Endoscopy;  Laterality: N/A;   COLONOSCOPY     x3   COLONOSCOPY WITH PROPOFOL  N/A 11/04/2014   Procedure: COLONOSCOPY WITH PROPOFOL ;  Surgeon: Charleston Donnald GAILS, MD;  Location: [facility];  Service: Endoscopy;  Laterality: N/A;   COLONOSCOPY WITH PROPOFOL  N/A 02/02/2018   Procedure: COLONOSCOPY WITH PROPOFOL ;  Surgeon: Donnald Charleston, MD;  Location: [facility];  Service: Endoscopy;  Laterality: N/A;   COLONOSCOPY WITH PROPOFOL  N/A 05/20/2021   Procedure: COLONOSCOPY WITH PROPOFOL ;  Surgeon: Donnald Charleston, MD;  Location: [facility];  Service: Endoscopy;  Laterality: N/A;   ELBOW BURSA SURGERY  2012   lt   GASTROCNEMIUS  RECESSION Left 02/21/2013   Procedure: GASTROCNEMIUS RECESSION;  Surgeon: Norleen Armor, MD;  Location: [facility];  Service: Orthopedics;  Laterality: Left;   HEMORRHOID SURGERY  2010   [facility]   POLYPECTOMY N/A 02/02/2018   Procedure: POLYPECTOMY;  Surgeon: Donnald Charleston, MD;  Location: [facility];  Service: Endoscopy;  Laterality: N/A;   POLYPECTOMY  05/20/2021   Procedure: POLYPECTOMY;  Surgeon: Donnald Charleston, MD;  Location: [facility];  Service: Endoscopy;;   STRABISMUS SURGERY     age 54 x3   TOTAL HIP ARTHROPLASTY Right 09/20/2017   Procedure: RIGHT TOTAL HIP ARTHROPLASTY ANTERIOR APPROACH;  Surgeon: Melodi Lerner, MD;  Location: [facility];  Service: Orthopedics;  Laterality: Right;   TOTAL HIP REVISION Right 07/14/2021   Procedure: Right hip acetabular  vs total hip arthroplasty revision;  Surgeon: Melodi Lerner, MD;  Location: [facility];  Service: Orthopedics;  Laterality: Right;   WISDOM TOOTH EXTRACTION      Current Medications[1]  Allergies[2]        Objective:  Physical Exam  General: AAO x3, NAD  Dermatological: On the right foot just distal to the heel plantarly there is a punctate annular hyperkeratotic lesion.  Is able to remove quite a bit of hyperkeratotic tissue today.  There is no puncture wound, foreign object identified and there is no edema, erythema.  No bleeding occurred.  On the plantar aspect of right hallux is a punctate area, puncture wound.  Minimal bleeding occurred during debridement.  Not able to visualize a foreign object today.  The nails are dystrophic with yellow, brown discoloration. Dry skin present bilaterally.   Vascular: Dorsalis Pedis artery and Posterior Tibial artery pedal pulses are 2/4 bilateral with immedate capillary fill time. There is no pain with calf compression, swelling, warmth, erythema.   Neruologic: Grossly intact via light touch bilateral.   Musculoskeletal: Tenderness the skin lesion on the plantar heel right foot.       Assessment:   Concern for residual foreign body right hallux, skin lesion right heel, onychomycosis     Plan:  -Treatment options discussed including all alternatives, risks, and complications -Etiology of symptoms were discussed -X-rays were obtained and reviewed with the patient.  Multiple views right foot were obtained.  No evidence of acute fracture.  Skin marker was utilized to identify the areas at the skin lesions.  Concern for residual foreign body right hallux -I did sharply debride the lesion without any complications and minimal bleeding occurred.  Not able to visualize a foreign object.  Discussed Epsom salt soaks and continue to monitor this area.  She thinks that she got the entire piece of wood out.  If symptoms persist will get ultrasound to  further evaluate.  Monitor for any signs or symptoms of infection.  Skin lesion right heel -Sharply debrided the lesion without any complications or bleeding.  Cleaned the skin with alcohol.  Pad was placed followed by salicylic acid and a bandage.  Postprocedure instructions discussed.  Monitor for any signs or symptoms of infection.  Onychomycosis - Continue current topical medication.  Discussed other options in the future if symptoms persist.  She wants to hold off on oral medications.  Dry skin -List of moisturizers given. Discussed daily application.   Donnice JONELLE Fees DPM     [1]  Current Outpatient Medications:    albuterol  (PROVENTIL  HFA;VENTOLIN  HFA) 108 (90 BASE) MCG/ACT inhaler, Inhale 2 puffs into the lungs every 6 (six) hours as needed for wheezing., Disp: , Rfl:  ANORO ELLIPTA  62.5-25 MCG/ACT AEPB, INHALE 1 PUFF INTO THE LUNGS DAILY., Disp: 120 each, Rfl: 2   aspirin  325 MG tablet, Take 325 mg by mouth daily., Disp: , Rfl:    atenolol  (TENORMIN ) 25 MG tablet, Take 25 mg by mouth every morning. , Disp: , Rfl:    azelastine  (ASTELIN ) 0.1 % nasal spray, Place 1 spray into both nostrils 2 (two) times daily., Disp: , Rfl:    calcium  carbonate (TUMS - DOSED IN MG ELEMENTAL CALCIUM ) 500 MG chewable tablet, Chew 1 tablet by mouth daily., Disp: , Rfl:    carboxymethylcellulose (REFRESH PLUS) 0.5 % SOLN, Place 1 drop into both eyes 3 (three) times daily as needed., Disp: , Rfl:    cetirizine (ZYRTEC) 10 MG tablet, Take 10 mg by mouth daily., Disp: , Rfl:    estradiol  (ESTRACE ) 1 MG tablet, Take 1 mg by mouth daily., Disp: , Rfl:    fluticasone  (FLONASE ) 50 MCG/ACT nasal spray, Place 2 sprays into both nostrils at bedtime as needed for allergies or rhinitis., Disp: , Rfl:    HYDROcodone -acetaminophen  (NORCO) 10-325 MG tablet, Take 1 tablet by mouth every 4 (four) hours as needed., Disp: , Rfl:    ibuprofen (ADVIL) 200 MG tablet, Take 200 mg by mouth every 6 (six) hours as needed.,  Disp: , Rfl:    levothyroxine  (SYNTHROID ) 150 MCG tablet, Take 150 mcg by mouth daily., Disp: , Rfl:    montelukast  (SINGULAIR ) 10 MG tablet, Take 10 mg by mouth at bedtime., Disp: , Rfl:    Multiple Vitamins-Minerals (CENTRUM SILVER PO), Take by mouth., Disp: , Rfl:    Omega-3 Fatty Acids (FISH OIL) 1360 MG CAPS, Take by mouth., Disp: , Rfl:    QVAR REDIHALER 80 MCG/ACT inhaler, Inhale 2 puffs into the lungs 2 (two) times daily., Disp: , Rfl:    rOPINIRole  (REQUIP ) 1 MG tablet, Take 1-1.5 mg by mouth at bedtime., Disp: , Rfl: 1   sertraline  (ZOLOFT ) 100 MG tablet, Take 100 mg by mouth daily., Disp: , Rfl:    solifenacin (VESICARE) 10 MG tablet, Take 10 mg by mouth daily., Disp: , Rfl:    VITAMIN D , CHOLECALCIFEROL, PO, Take 4,000 Units by mouth daily., Disp: , Rfl:  [2]  Allergies Allergen Reactions   Atorvastatin  Other (See Comments)    Joint Pain  Other Reaction(s): Worsened RLS   Atorvastatin  Calcium  Other (See Comments)    atorvastatin  calcium    Dilaudid  [Hydromorphone  Hcl] Nausea And Vomiting and Other (See Comments)    Tachycardia, HOT MOUTH   Hydromorphone  Nausea And Vomiting and Other (See Comments)

## 2024-10-18 NOTE — Patient Instructions (Addendum)
 Keep the bandage on for 24 hours. At that time, remove and clean with soap and water . If it hurts or burns before 24 hours go ahead and remove the bandage and wash with soap and water . Keep the area clean. If there is any blistering cover with antibiotic ointment and a bandage. Monitor for any redness, drainage, or other signs of infection. Call the office if any are to occur. If you have any questions, please call the office at [phone].  --   Choose a moisturizer from  the list below:  For normal skin: Moisturize feet once daily; do not apply between toes CeraVe Daily Moisturizing Lotion Lubriderm Advanced Therapy Lotion or Lubriderm Intense Skin Repair Lotion Aquaphor Intensive Repair Lotion Gold Bond Ultimate Diabetic Foot Lotion Eucerin Intensive Repair Moisturizing Lotion  For extremely dry, cracked feet: moisturize feet once daily; do not apply between toes Bag Balm Skin Moisturizer (green tin can) CeraVe Healing Ointment Eucerin Aquaphor Advanced Repair Cream Vaseline Petroleum Healing Jelly   If you have problems reaching your feet: apply to feet once daily; do not apply between toes Aquaphor Advanced Therapy Ointment Body Spray  Vaseline Intensive Care Spray Moisturizer (Unscented,  [place]oa Radiant Spray or Aloe Smooth Spray)

## 2024-11-05 ENCOUNTER — Encounter (HOSPITAL_COMMUNITY)

## 2024-11-15 ENCOUNTER — Ambulatory Visit (HOSPITAL_COMMUNITY): Admit: 2024-11-15 | Admitting: Orthopedic Surgery

## 2024-11-15 SURGERY — ARTHROPLASTY, SHOULDER, TOTAL, REVERSE
Anesthesia: Choice | Site: Shoulder | Laterality: Right
